# Patient Record
Sex: Female | Born: 1980
Health system: Southern US, Community
[De-identification: ages and names within clinical notes are randomized; demographics above are authoritative.]

## PROBLEM LIST (undated history)

## (undated) DIAGNOSIS — F329 Major depressive disorder, single episode, unspecified: Secondary | ICD-10-CM

## (undated) DIAGNOSIS — F32A Depression, unspecified: Secondary | ICD-10-CM

## (undated) DIAGNOSIS — E785 Hyperlipidemia, unspecified: Secondary | ICD-10-CM

## (undated) DIAGNOSIS — J302 Other seasonal allergic rhinitis: Secondary | ICD-10-CM

## (undated) DIAGNOSIS — K589 Irritable bowel syndrome without diarrhea: Secondary | ICD-10-CM

## (undated) DIAGNOSIS — K5900 Constipation, unspecified: Secondary | ICD-10-CM

## (undated) DIAGNOSIS — R768 Other specified abnormal immunological findings in serum: Secondary | ICD-10-CM

## (undated) DIAGNOSIS — Z9889 Other specified postprocedural states: Secondary | ICD-10-CM

## (undated) DIAGNOSIS — E559 Vitamin D deficiency, unspecified: Secondary | ICD-10-CM

## (undated) DIAGNOSIS — Z8739 Personal history of other diseases of the musculoskeletal system and connective tissue: Secondary | ICD-10-CM

## (undated) DIAGNOSIS — R112 Nausea with vomiting, unspecified: Secondary | ICD-10-CM

## (undated) DIAGNOSIS — I7300 Raynaud's syndrome without gangrene: Secondary | ICD-10-CM

## (undated) DIAGNOSIS — M359 Systemic involvement of connective tissue, unspecified: Secondary | ICD-10-CM

## (undated) DIAGNOSIS — K921 Melena: Secondary | ICD-10-CM

## (undated) DIAGNOSIS — M7989 Other specified soft tissue disorders: Secondary | ICD-10-CM

## (undated) DIAGNOSIS — K760 Fatty (change of) liver, not elsewhere classified: Secondary | ICD-10-CM

## (undated) DIAGNOSIS — M1990 Unspecified osteoarthritis, unspecified site: Secondary | ICD-10-CM

## (undated) DIAGNOSIS — K219 Gastro-esophageal reflux disease without esophagitis: Secondary | ICD-10-CM

## (undated) DIAGNOSIS — F419 Anxiety disorder, unspecified: Secondary | ICD-10-CM

## (undated) DIAGNOSIS — D649 Anemia, unspecified: Secondary | ICD-10-CM

## (undated) DIAGNOSIS — T7840XA Allergy, unspecified, initial encounter: Secondary | ICD-10-CM

## (undated) DIAGNOSIS — G9332 Myalgic encephalomyelitis/chronic fatigue syndrome: Secondary | ICD-10-CM

## (undated) DIAGNOSIS — R002 Palpitations: Secondary | ICD-10-CM

## (undated) DIAGNOSIS — K37 Unspecified appendicitis: Secondary | ICD-10-CM

## (undated) DIAGNOSIS — R079 Chest pain, unspecified: Secondary | ICD-10-CM

## (undated) DIAGNOSIS — D509 Iron deficiency anemia, unspecified: Secondary | ICD-10-CM

## (undated) DIAGNOSIS — L509 Urticaria, unspecified: Secondary | ICD-10-CM

## (undated) DIAGNOSIS — B019 Varicella without complication: Secondary | ICD-10-CM

## (undated) HISTORY — DX: Personal history of other diseases of the musculoskeletal system and connective tissue: Z87.39

## (undated) HISTORY — DX: Major depressive disorder, single episode, unspecified: F32.9

## (undated) HISTORY — DX: Unspecified appendicitis: K37

## (undated) HISTORY — DX: Other specified soft tissue disorders: M79.89

## (undated) HISTORY — DX: Irritable bowel syndrome, unspecified: K58.9

## (undated) HISTORY — DX: Anemia, unspecified: D64.9

## (undated) HISTORY — DX: Varicella without complication: B01.9

## (undated) HISTORY — DX: Hyperlipidemia, unspecified: E78.5

## (undated) HISTORY — DX: Anxiety disorder, unspecified: F41.9

## (undated) HISTORY — DX: Depression, unspecified: F32.A

## (undated) HISTORY — DX: Other specified abnormal immunological findings in serum: R76.8

## (undated) HISTORY — DX: Urticaria, unspecified: L50.9

## (undated) HISTORY — DX: Myalgic encephalomyelitis/chronic fatigue syndrome: G93.32

## (undated) HISTORY — PX: WISDOM TOOTH EXTRACTION: SHX21

## (undated) HISTORY — DX: Unspecified osteoarthritis, unspecified site: M19.90

## (undated) HISTORY — DX: Constipation, unspecified: K59.00

## (undated) HISTORY — DX: Chest pain, unspecified: R07.9

## (undated) HISTORY — DX: Vitamin D deficiency, unspecified: E55.9

## (undated) HISTORY — DX: Iron deficiency anemia, unspecified: D50.9

## (undated) HISTORY — DX: Raynaud's syndrome without gangrene: I73.00

## (undated) HISTORY — DX: Gastro-esophageal reflux disease without esophagitis: K21.9

## (undated) HISTORY — DX: Fatty (change of) liver, not elsewhere classified: K76.0

## (undated) HISTORY — DX: Palpitations: R00.2

## (undated) HISTORY — DX: Melena: K92.1

## (undated) HISTORY — DX: Allergy, unspecified, initial encounter: T78.40XA

---

## 2001-11-30 ENCOUNTER — Other Ambulatory Visit: Admission: RE | Admit: 2001-11-30 | Discharge: 2001-11-30 | Payer: Self-pay | Admitting: Obstetrics & Gynecology

## 2002-11-18 ENCOUNTER — Inpatient Hospital Stay (HOSPITAL_COMMUNITY): Admission: AD | Admit: 2002-11-18 | Discharge: 2002-12-01 | Payer: Self-pay | Admitting: Obstetrics & Gynecology

## 2002-11-25 ENCOUNTER — Encounter: Payer: Self-pay | Admitting: Obstetrics and Gynecology

## 2002-11-28 ENCOUNTER — Encounter: Payer: Self-pay | Admitting: Obstetrics & Gynecology

## 2002-11-29 ENCOUNTER — Encounter: Payer: Self-pay | Admitting: Obstetrics and Gynecology

## 2002-12-22 ENCOUNTER — Other Ambulatory Visit: Admission: RE | Admit: 2002-12-22 | Discharge: 2002-12-22 | Payer: Self-pay | Admitting: Obstetrics and Gynecology

## 2004-01-09 ENCOUNTER — Other Ambulatory Visit: Admission: RE | Admit: 2004-01-09 | Discharge: 2004-01-09 | Payer: Self-pay | Admitting: Obstetrics and Gynecology

## 2004-08-09 ENCOUNTER — Ambulatory Visit: Payer: Self-pay | Admitting: Internal Medicine

## 2004-11-14 ENCOUNTER — Other Ambulatory Visit: Admission: RE | Admit: 2004-11-14 | Discharge: 2004-11-14 | Payer: Self-pay | Admitting: Obstetrics and Gynecology

## 2004-11-15 ENCOUNTER — Other Ambulatory Visit: Admission: RE | Admit: 2004-11-15 | Discharge: 2004-11-15 | Payer: Self-pay | Admitting: Obstetrics and Gynecology

## 2005-06-15 ENCOUNTER — Inpatient Hospital Stay (HOSPITAL_COMMUNITY): Admission: AD | Admit: 2005-06-15 | Discharge: 2005-06-15 | Payer: Self-pay | Admitting: Obstetrics & Gynecology

## 2005-06-20 ENCOUNTER — Inpatient Hospital Stay (HOSPITAL_COMMUNITY): Admission: AD | Admit: 2005-06-20 | Discharge: 2005-06-22 | Payer: Self-pay | Admitting: Obstetrics and Gynecology

## 2005-06-20 ENCOUNTER — Encounter (INDEPENDENT_AMBULATORY_CARE_PROVIDER_SITE_OTHER): Payer: Self-pay | Admitting: *Deleted

## 2006-04-07 ENCOUNTER — Ambulatory Visit: Payer: Self-pay | Admitting: Endocrinology

## 2007-01-31 ENCOUNTER — Encounter: Payer: Self-pay | Admitting: *Deleted

## 2007-01-31 DIAGNOSIS — I7300 Raynaud's syndrome without gangrene: Secondary | ICD-10-CM | POA: Insufficient documentation

## 2007-01-31 DIAGNOSIS — K219 Gastro-esophageal reflux disease without esophagitis: Secondary | ICD-10-CM

## 2007-01-31 HISTORY — DX: Raynaud's syndrome without gangrene: I73.00

## 2007-01-31 HISTORY — DX: Gastro-esophageal reflux disease without esophagitis: K21.9

## 2008-02-01 ENCOUNTER — Ambulatory Visit: Payer: Self-pay | Admitting: Internal Medicine

## 2008-02-01 DIAGNOSIS — J209 Acute bronchitis, unspecified: Secondary | ICD-10-CM | POA: Insufficient documentation

## 2008-08-23 ENCOUNTER — Ambulatory Visit: Payer: Self-pay | Admitting: Internal Medicine

## 2008-12-27 ENCOUNTER — Inpatient Hospital Stay (HOSPITAL_COMMUNITY): Admission: AD | Admit: 2008-12-27 | Discharge: 2008-12-31 | Payer: Self-pay | Admitting: Obstetrics and Gynecology

## 2008-12-28 ENCOUNTER — Encounter (INDEPENDENT_AMBULATORY_CARE_PROVIDER_SITE_OTHER): Payer: Self-pay | Admitting: Obstetrics and Gynecology

## 2010-04-13 ENCOUNTER — Telehealth: Payer: Self-pay | Admitting: Internal Medicine

## 2010-06-12 NOTE — Progress Notes (Signed)
  Phone Note Other Incoming   Summary of Call: Per Dr Everardo All Buspar prescr by Dr Fabian Sharp before. [facility] is closed -needs Buspar called in to local pharmacy  Initial call taken by: Tresa Garter MD,  April 13, 2010 5:54 PM  Follow-up for Phone Call        ok Follow-up by: Tresa Garter MD,  April 13, 2010 6:04 PM    New/Updated Medications: BUSPIRONE HCL 15 MG TABS (BUSPIRONE HCL) 1/2 tab two times a day for anxiety Prescriptions: BUSPIRONE HCL 15 MG TABS (BUSPIRONE HCL) 1/2 tab two times a day for anxiety  #30 x 0   Entered and Authorized by:   Tresa Garter MD   Signed by:   Tresa Garter MD on 04/13/2010   Method used:   Electronically to        [facility] [address] [phone]* (retail)       [address]       [place], [place]  [address]       Ph: [phone] or [phone]       Fax: [phone]   RxID:   [id number]

## 2010-06-19 ENCOUNTER — Other Ambulatory Visit: Payer: Self-pay | Admitting: Obstetrics and Gynecology

## 2010-08-18 LAB — CBC
HCT: 25.9 % — ABNORMAL LOW (ref 36.0–46.0)
Hemoglobin: 9.2 g/dL — ABNORMAL LOW (ref 12.0–15.0)
MCHC: 34.7 g/dL (ref 30.0–36.0)
MCHC: 34.8 g/dL (ref 30.0–36.0)
MCHC: 35.5 g/dL (ref 30.0–36.0)
MCV: 89.7 fL (ref 78.0–100.0)
MCV: 91.1 fL (ref 78.0–100.0)
Platelets: 221 10*3/uL (ref 150–400)
Platelets: 231 10*3/uL (ref 150–400)
Platelets: 242 10*3/uL (ref 150–400)
RBC: 2.47 MIL/uL — ABNORMAL LOW (ref 3.87–5.11)
RDW: 13.3 % (ref 11.5–15.5)
RDW: 13.4 % (ref 11.5–15.5)
RDW: 13.6 % (ref 11.5–15.5)
WBC: 17 10*3/uL — ABNORMAL HIGH (ref 4.0–10.5)

## 2010-08-18 LAB — COMPREHENSIVE METABOLIC PANEL
ALT: 17 U/L (ref 0–35)
ALT: 17 U/L (ref 0–35)
ALT: 18 U/L (ref 0–35)
ALT: 19 U/L (ref 0–35)
AST: 23 U/L (ref 0–37)
AST: 23 U/L (ref 0–37)
AST: 36 U/L (ref 0–37)
Albumin: 2.2 g/dL — ABNORMAL LOW (ref 3.5–5.2)
Albumin: 2.7 g/dL — ABNORMAL LOW (ref 3.5–5.2)
Alkaline Phosphatase: 106 U/L (ref 39–117)
Alkaline Phosphatase: 86 U/L (ref 39–117)
Alkaline Phosphatase: 90 U/L (ref 39–117)
BUN: 3 mg/dL — ABNORMAL LOW (ref 6–23)
CO2: 25 mEq/L (ref 19–32)
CO2: 28 mEq/L (ref 19–32)
Calcium: 10.6 mg/dL — ABNORMAL HIGH (ref 8.4–10.5)
Calcium: 6.9 mg/dL — ABNORMAL LOW (ref 8.4–10.5)
Chloride: 104 mEq/L (ref 96–112)
Chloride: 105 mEq/L (ref 96–112)
Creatinine, Ser: 0.91 mg/dL (ref 0.4–1.2)
Creatinine, Ser: 1.05 mg/dL (ref 0.4–1.2)
GFR calc Af Amer: >60 mL/min (ref >60)
GFR calc Af Amer: >60 mL/min (ref >60)
GFR calc Af Amer: >60 mL/min (ref >60)
GFR calc Af Amer: >60 mL/min (ref >60)
GFR calc non Af Amer: >60 mL/min (ref >60)
GFR calc non Af Amer: >60 mL/min (ref >60)
Glucose, Bld: 81 mg/dL (ref 70–99)
Potassium: 3 mEq/L — ABNORMAL LOW (ref 3.5–5.1)
Potassium: 3 mEq/L — ABNORMAL LOW (ref 3.5–5.1)
Potassium: 3.3 mEq/L — ABNORMAL LOW (ref 3.5–5.1)
Sodium: 137 mEq/L (ref 135–145)
Sodium: 137 mEq/L (ref 135–145)
Sodium: 137 mEq/L (ref 135–145)
Sodium: 138 mEq/L (ref 135–145)
Total Bilirubin: 0.6 mg/dL (ref 0.3–1.2)
Total Protein: 5.2 g/dL — ABNORMAL LOW (ref 6.0–8.3)
Total Protein: 5.7 g/dL — ABNORMAL LOW (ref 6.0–8.3)
Total Protein: 6.7 g/dL (ref 6.0–8.3)

## 2010-08-18 LAB — LACTATE DEHYDROGENASE
LDH: 104 U/L (ref 94–250)
LDH: 127 U/L (ref 94–250)

## 2010-08-18 LAB — URINALYSIS, DIPSTICK ONLY
Ketones, ur: NEGATIVE mg/dL
Leukocytes, UA: NEGATIVE
Nitrite: NEGATIVE
Protein, ur: NEGATIVE mg/dL
pH: 8 (ref 5.0–8.0)

## 2010-08-18 LAB — MAGNESIUM: Magnesium: 7 mg/dL (ref 1.5–2.5)

## 2010-09-25 NOTE — Op Note (Signed)
NAMEGREENLY, Lindsay Wong                ACCOUNT NO.:  [id number]   MEDICAL RECORD NO.:  [id number]          PATIENT TYPE:  INP   LOCATION:  9371                          [facility]ACILITY:  [facility]   PHYSICIAN:  Carrington Clamp, M.D. DATE OF BIRTH:  07-Mar-1981   DATE OF PROCEDURE:  12/27/2008  DATE OF DISCHARGE:                               OPERATIVE REPORT   PREOPERATIVE DIAGNOSES:  Severe preeclampsia 35-5/7th weeks with history  of severe preeclampsia and prior pregnancies and repeat cesarean  section.   POSTOPERATIVE DIAGNOSES:  Severe preeclampsia 35-5/7th weeks with  history of severe preeclampsia and prior pregnancies and repeat cesarean  section.   PROCEDURE:  Low transverse cesarean section.   SURGEON:  Carrington Clamp, MD   ANESTHESIA:  Spinal.   FINDINGS:  Female infant, frank breech presentation with a single nuchal  cord with Apgars 9 and 9.  Baby weighed 5 pounds 15 ounces.  There was  normal tubes, ovaries, and uterus seen.   SPECIMENS:  Placenta.   DISPOSITION:  To pathology.   BLOOD LOSS:  600 mL.   IV FLUIDS:  2000 mL.   URINE OUTPUT:  300 mL.   COMPLICATIONS:  None.   MEDICATIONS:  Magnesium sulfate, Ancef 2 g, and Pitocin.   COUNTS:  Correct x3.   REASON FOR OPERATION:  Ms. Bucher has had 2 prior cesarean deliveries  at 36 weeks.  This was secondary to severe preeclampsia.  Although the  patient has never HELLP syndrome, she did have history of severe  progressively worsening preeclampsia and blood pressures.  She was  admitted on December 27, 2008, with complaints of abdominal headaches and  nausea and vomiting.  The patient's blood pressures were running 150s-  160s over 100s.  The patient was observed overnight.  Although her  laboratory values all remained within normal limits, her platelets did  drop from 230-200 and her creatinine went up from 0.88-0.92.  The  patient continued to feel bad, have a dull headache, have started with  some mouth tingling and  continued vomiting today.  Given the history of  severe preeclampsia and 2 prior pregnancies at 36 weeks and the clearly  elevated severe blood pressures with severe symptoms, I felt strongly  that we would probably not get more than 1-2 days before the patient  would move into worsening symptoms possibly HELLP, possible pulmonary  edema, possible seizures.  After discussing with the patient and her  husband the risks, benefits, and alternatives of delivery 2 days earlier  versus worsening symptoms and the need for possible emergent cesarean  section, we decided to proceed with a C-section today.  All risks,  benefits, and alternatives have been discussed with the patient and her  husband.   Technique:  After adequate spinal anesthesia was achieved, the patient was prepped  and draped in usual sterile fashion in dorsal supine position with  leftward tilt.  A Pfannenstiel skin incision was made with the scalpel  and carried down to the fascia with Bovie cautery.  The fascia was  incised in the midline carefully with the scalpel.  It  was extended then  in a transverse curvilinear manner with the Mayo scissors.  The rectus  muscles were completely diastased except for one portion on the right-  hand side.  This was carefully removed from the anterior posterior  fascia.  The scalpel was then used to enter into scar tissue of the  peritoneum superiorly as far away from the bladder as possible.  The  peritoneum was entered into successfully and then the peritoneum was  able to be opened in a superior inferior manner with good visualization  of the bowel and the bladder.   The lower uterine segment was grossly adhesed to the peritoneum, and  these adhesions were taken down carefully with blunt and sharp  dissection.  The bladder was well out of the way of the field of the  dissection, and a 2-cm incision was then able to be made after the  Charlotte instrument had been placed in the upper  portion of the lower  uterine segment.  Clear fluid was noted on entry into the amnion.  The  uterus was extended in transverse curvilinear manner.  Baby was  identified in the breech presentation and delivered without  complication.  The baby was bulb suctioned, and cord was clamped and  cut.  Baby was handed to awaiting pediatrics.   The patient and her husband have desired private donation cord blood,  and the cord blood was obtained with the private donation kit.  Once  that was achieved, the placenta was then delivered from the uterus, and  the uterus cleared of all debris.  The uterine incisions were then  closed with running locked stitch of 0 Monocryl.  An imbricating layer  was performed as well.  The ovaries and tubes appeared to be intact, and  the patient and her husband were uncertain about permanent  sterilization, so tubal was not done.   The peritoneum was then able to be identified and brought together  carefully in the midline with 2-0 Vicryl.  Then, a careful inspection of  all of the portions of the rectus muscles some which were diastased and  some which were midline were all examined.  In the midline, inferior  portion of the rectus muscles that were the midline, these were brought  together carefully with interrupted stitches of 0 Vicryl.  To the  lateral side, the portions of the rectus muscle diastased were brought  together back towards the edges of the midline portions of the rectus  muscles with interrupted stitches of 0 Vicryl.  The fascia was then  closed with a running stitch of 0 Vicryl.  The subcutaneous tissue was  rendered hemostatic with Bovie cautery and irrigation and then was  mobilized inferiorly and superiorly with the Bovie cautery.  The old  scar which had pulled in significantly was then able to be removed with  the scalpel in the lower portion.  The subcutaneous tissue was then  closed in 2 layers with interrupted stitches of 2-0 plain gut.   The skin  was closed with staples.  The patient will need to wear an abdominal  binder because of the repair of the rectus muscle diastasis.      Carrington Clamp, M.D.  Electronically Signed     MH/MEDQ  D:  12/28/2008  T:  12/29/2008  Job:  [id number]

## 2010-09-28 NOTE — Discharge Summary (Signed)
[name], [name]                ACCOUNT NO.:  [id number]   MEDICAL RECORD NO.:  [id number]          PATIENT TYPE:  INP   LOCATION:  9374                          [facility]ACILITY:  [facility]   PHYSICIAN:  Malva Limes, M.D.    DATE OF BIRTH:  [birth year]   DATE OF ADMISSION:  06/20/2005  DATE OF DISCHARGE:  06/22/2005                                 DISCHARGE SUMMARY   FINAL DIAGNOSES:  1.  Intrauterine gestation at 38 and four-sevenths weeks gestation.  2.  Preeclampsia.  3.  History of prior cesarean section, desires repeat cesarean section.  4.  Positive group B streptococcus.   PROCEDURE:  Repeat cesarean section using transverse lower fundal incision.  Surgeon:  Dr. Conley Simmonds. Assistant:  None. Complications:  None.   This 30 year old G2, P1 presents at 91 and four-sevenths weeks gestation for  a blood pressure check. The patient had been followed closely in the office  over the last 2 weeks due to some elevated blood pressures. On June 18, 2005, she had pre-eclamptic labs checked which were negative except for an  elevated uric acid. She had a trace of protein in her urine but a reactive  nonstress test. Otherwise, the patient's antepartum course up to this point  had not been complicated. She has had a history of C-section, wants a repeat  with this pregnancy. The patient also was tested to be positive group B  strep around her 35-week visit. Her blood pressure started elevating around  35-[redacted] weeks gestation. Like I said, she had been continued to be monitored.  When she presented to the office on February 8 her blood pressure at home  was 150/106. She was reporting headaches, malaise, increased dyspepsia and  some vomiting. Blood pressure was 140/100 in the office with 3+ protein in  her urine. She was sent immediately to [facility] for fetal monitoring  and repeat PIH labs, which still documented that elevated uric acid. At this  point a decision was made with the patient  to proceed with a cesarean  section secondary to the preeclampsia. The patient was taken to the  operating room on June 20, 2005, by Dr. Conley Simmonds where a repeat low  fundal incision was made with the delivery of a 6-pound 2-ounce female infant  with Apgars of 9 and 9. The patient was, of course, on magnesium sulfate for  prophylaxis during her delivery and 24 hours postoperatively. By  postoperative day #1 she was diuresing well. Her blood pressure was  improving. She was Wong to be stopped on the magnesium sulfate after that 24-  hour period. The patient wanted discharge on postoperative day #2. Her blood  pressures were okay, she was feeling better, and she was felt ready for  discharge. The patient was sent home on a regular diet, told to decrease her  activities, told to continue her prenatal vitamins while breast feeding, was  given Percocet one to two every 4 hours as needed for pain, was told she  could use over-the-counter Motrin up to 600 mg every 6 hours as needed for  pain, was to follow up in the office on Monday for a staple removal and  blood pressure check, of course to call with any increased pain, bleeding or  problems.   LABORATORY ON DISCHARGE:  The patient had a hemoglobin of 9.9; white blood  cell count of 14.7; platelets of 243,000; and the patient's PIH panel was  within normal limits.      Lindsay Wong, P.A.-C.    ______________________________  Malva Limes, M.D.    MB/MEDQ  D:  07/11/2005  T:  07/11/2005  Job:  [id number]

## 2010-09-28 NOTE — Op Note (Signed)
NAME:  Lindsay Wong, Lindsay Wong NO.:  [id number]   MEDICAL RECORD NO.:  [id number]                   PATIENT TYPE:  INP   LOCATION:  9199                                 [facility]ILITY:  [facility]   PHYSICIAN:  Malva Limes, M.D.                 DATE OF BIRTH:  1981-03-08   DATE OF PROCEDURE:  11/19/2002  DATE OF DISCHARGE:                                 OPERATIVE REPORT   POSTOPERATIVE DIAGNOSES:  1. Intrauterine pregnancy at 26 weeks estimated gestational age.  2. Pre-eclampsia.  3. Cephalopelvic disproportion.  4. Fetal tachycardia.   POSTOPERATIVE DIAGNOSES:  1. Intrauterine pregnancy at 36 weeks estimated gestational age.  2 . Pre-eclampsia.  1. Cephalopelvic disproportion.  2. Fetal tachycardia.   PROCEDURE:  Primary low transverse cesarean section.   SURGEON:  Malva Limes, M.D.   ANESTHESIA:  Epidural.   ANTIBIOTICS:  Unasyn.   ESTIMATED BLOOD LOSS:  900 mL.   COMPLICATIONS:  None.   SPECIMENS:  None.   DRAINS:  Foley to bedside drainage.   PROCEDURE:  The patient was taken to the operating room where she was placed  in the dorsal supine position with left lateral tilt.  Once an adequate  level of anesthetic was reached the patient was prepped with Hibiclens and  draped in the usual fashion for this procedure.  A Pfannenstiel incision was  made and this was carried down to the fascia.  The fascia was entered in the  midline and extended laterally.  The rectus muscles were separated from the  fascia with the Bovie.  The rectus muscles were divided in the midline and  taken superiorly and inferiorly.  The parietoperitoneum was entered and  taken superiorly and inferiorly.  The bladder flap was taken down sharply.  A low transverse uterine incision was made in the midline and extended  laterally with blunt dissection. Amniotic fluid was noted to be clear on  entering the amniotic sac.  The infant was delivered in the vertex  presentation.  On  delivery of the head the nostrils and oropharynx were bulb  suctioned.  The infant was delivered.  The cord was doubly clamped and cut.  The infant was handed to the waiting NICU team.  The placenta was then  manually removed. The uterus was exteriorized and the uterine cavity was  wiped with a wet lap.  The uterine incision was closed with a single layer  of 0 chromic in a running locking fashion.  The bladder flap was closed  using 3-0 chromic in a running fashion.  The uterus was placed back into the  abdominal cavity.  Hemostasis was rechecked and found to be adequate.  The  parietoperitoneum and rectus muscles were reapproximated in the midline  using 3-0 chromic in a running fashion.  The fascia was closed using 0  Monocryl suture in a running fashion.  The subcuticular tissue was  closed  using interrupted 0 chromic suture.  The skin was closed with stainless  steel staples.  The patient was taken to the recovery room in stable  condition.  Sponge, instrument and lap counts were correct times two.                                               Malva Limes, M.D.    MA/MEDQ  D:  11/19/2002  T:  11/21/2002  Job:  [id number]

## 2010-09-28 NOTE — Discharge Summary (Signed)
NAME:  Lindsay Wong, KNISKERN                          ACCOUNT NO.:  [id number]   MEDICAL RECORD NO.:  [id number]                   PATIENT TYPE:  INP   LOCATION:  9140                                 [facility]   PHYSICIAN:  Gerrit Friends. Aldona Bar, M.D.                DATE OF BIRTH:  [birth year]   DATE OF ADMISSION:  11/18/2002  DATE OF DISCHARGE:  12/01/2002                                 DISCHARGE SUMMARY   DISCHARGE DIAGNOSES:  1. A 36-week intrauterine pregnancy.  Delivered viable 6 pound 2 ounce female     infant, Apgars 8 and 9.  2. Blood type B+.  3. Preeclampsia.  4. Cephalopelvic disproportion.  5. Postoperative fever secondary to organizing hematoma right lower     quadrant.   PROCEDURES:  1. Primary low transverse cesarean section November 19, 2002, Malva Limes, M.D.  2. Eventual placement of PICC line on July 18.  3. Intravenous antibiotics.  4. Infectious disease consultation.  5. CAT scan x2.  6. Ultrasound x1.   SUMMARY:  This 30 year old primigravida was admitted at 47 weeks with  hypertension and proteinuria and felt to have severe preeclampsia.  She was  started on magnesium sulfate and underwent induction of labor.  She  progressed to full dilatation, but unfortunately, was unable to deliver the  baby.  She did have a fever while she was in labor.  She underwent a primary  low transverse cesarean section on the evening of July 9.  Postoperatively  she did well initially, but did develop some fever and antibiotics were  manipulated somewhat over the ensuing three to four days.  On the morning of  July 15 she was having spiking p.m. fevers and a pelvic examination revealed  tenderness in the right lower quadrant/right adnexal area.  Her white count  was 14,700.  Clindamycin was added to her antibiotic regimen and a CAT scan  was carried out with no specific findings except for some postoperative  fluid accumulations in the right lower quadrant noted.  On the morning of  July 16 she had an episode of right lower quadrant pain.  Underwent another  pelvic examination.  When I took over her care on the morning of July 16  after the episode of right lower quadrant she was more comfortable.  Her  white count was 13,400.  Her platelet count had risen to 522,000 and her  triple antibiotics were continued.  Because of the possibility of septic  pelvic thrombophlebitis she was heparinized on the evening of July 16.  It  was a little difficult to heparinize her adequately and she probably did not  become adequately heparinized until July 17 but nonetheless her fevers  persisted.  She did not clinically look that sick but because of her  persistent fever and her elevated white count and her elevated platelet  count infectious disease consultation was obtained from [facility]  Lubertha Sayres,  M.D.  On Fransisco Hertz, M.D. recommendation her ampicillin and gentamicin  were discontinued on July 18 and Avelox 400 mg IV q.24h. was substituted.  Her anticoagulation was continued until July 19.  Because of difficulty in  obtaining intravenous access, a PICC line was placed on July 18.  On July  19, as mentioned, she underwent a repeat CAT scan after pelvic ultrasound  and the pelvic and abdominal ultrasounds suggested a hematoma in the right  adnexal area measuring approximately 5 cm.  Her CAT scan was relatively  unremarkable with the exception of what was seen in the right adnexal area.  Anticoagulation was discontinued on the evening of July 19.  Drainage of  this hematoma was considered.  However, on the morning of July 20 the  patient had improvement in her white count.  The white blood count went from  24,700 on the morning of July 19 to 16,300 on the morning of July 20.  Likewise, her platelet count decreased from 643,000 on the morning of July  19 to 534,000 on the morning of July 20.  On the morning of July 21 her  white count was 14,700, her hemoglobin stable at 8.9, and her  platelet count  546,000.  Differential was not done.   On the morning of July 21 it was felt that she was doing well except for her  persistent fever which was now low grade and it was decided that she could  continue her antibiotics per home health at home.  Her Avelox was changed to  Aztreonam and her clindamycin was continued.  It was felt that her fever was  most likely from her right lower quadrant collection, hematoma, plus/minus  infection.  She was discharged to home as mentioned to have home health  continue her clindamycin 900 mg q.8h. for 10 more days as well as Aztreonam  1 g q.8h. IV.  It will be arranged for her to have a CBC and differential  drawn on the morning of July 23 with results called to [facility].  Subsequent follow-up in our office will be two to three weeks hence and  close follow-up not only by our office but infectious disease will be  maintained.   CONDITION ON DISCHARGE:  Improved.  Of note, the patient had a PPD placed on  July 19 which as of the morning of July 21 was negative and she had a stool  for Clostridium difficile x2 which was negative.   In summary, this patient underwent a cesarean section on the evening of July  9 for obstetrical indications and had a postoperative fever and elevated  white count presumably secondary to a right lower quadrant/right adnexal  collection, hematoma, possibly/probably infected which did ultimately  respond to antibiotics.  Septic pelvic thrombophlebitis was entertained  during the course of her hospital stay but anticoagulation with heparin did  not result in any direct improvement and it was not until the morning of  July 20 that there was actually an improvement in the white blood count and  elevated platelet count, concurrent with the previous days' ultrasound and  CAT scan revealing the collection in the right lower quadrant.  Because of improvement decision was made to discharge the patient to home  on continued  antibiotic therapy in hopes of resolution.  Exploration was considered but  because of the patient's improvement it was decided not to be pursued at  this point in time.  CONDITION ON DISCHARGE:  Improved.                                               Gerrit Friends. Aldona Bar, M.D.    RMW/MEDQ  D:  12/01/2002  T:  12/01/2002  Job:  [id number]   cc:   Fransisco Hertz, M.D.  [address]  [place] [address]  Fax: [phone]

## 2010-09-28 NOTE — Op Note (Signed)
Lindsay Wong, Lindsay Wong NO.:  [id number]   MEDICAL RECORD NO.:  [id number]          PATIENT TYPE:  INP   LOCATION:  9374                          FACILITY:  [facility]   PHYSICIAN:  Randye Lobo, M.D.   DATE OF BIRTH:  [birth year]   DATE OF PROCEDURE:  06/20/2005  DATE OF DISCHARGE:                                 OPERATIVE REPORT   PREOPERATIVE DIAGNOSES:  1.  Intrauterine gestation at 36 weeks 4 days.  2.  Preeclampsia.  3.  History of prior cesarean section, desires repeat cesarean section.   POSTOPERATIVE DIAGNOSES:  1.  Intrauterine gestation at 36 weeks 4 days.  2.  Preeclampsia.  3.  History of prior cesarean section, desires repeat cesarean section.   PROCEDURE:  Repeat cesarean section, transverse lower fundal incision.   SURGEON:  Randye Lobo, M.D.   ANESTHESIA:  Spinal.   IV FLUIDS:  2000 mL Ringer's lactate.   ESTIMATED BLOOD LOSS:  600 mL.   URINE OUTPUT:  225 mL.   COMPLICATIONS:  None.   INDICATIONS FOR PROCEDURE:  The patient is a 30 year old gravida 2, para 1  Caucasian female at 103 plus 4 weeks' gestation by last menstrual period and  first trimester ultrasound, who presented to the office for a blood pressure  recheck.  The patient had been followed closely in the office over the last  two weeks due to elevated blood pressures.  On June 18, 2004, the patient  had pre-eclamptic labs checked which were unremarkable with the exception of  an elevated uric acid.  She had trace protein in her urine.  She had a  reactive nonstress test.   When the patient presented to the office on June 20, 2005, she reported  that her blood pressure at home had been 150/106.  She was reporting  headache, malaise, increased dyspepsia and some vomiting.  The patient's  blood pressure was noted to be 140/100.  She had 3+ protein in her urine.  The patient was sent to the [facility] for fetal monitoring and repeat  [facility] labs which again documented  only an elevated uric acid.  The patient's  blood pressure continued to be elevated at the [facility], and a plan  was made to proceed with a repeat cesarean section after risks, benefits,  and alternatives were discussed with her.  Of note, the patient was  scheduled for repeat cesarean section on June 24, 2005.   FINDINGS:  A viable female was delivered at 24.  Apgars were 9 at one minute  and 9 at five minutes.  The presentation was vertex.  The amniotic fluid was  clear.  The patient had a normal uterus, tubes, and ovaries.  There was some  scarring of the abdominal wall.  There appeared to be a diastasis recti on  the patient's left side.  This was repaired at the termination of the  cesarean section.  There was also some significant dense scarring between  the bladder and the lower uterine segment which necessitated making  essentially a transverse lower fundal uterine incision for  the cesarean  delivery.   SPECIMENS:  The placenta was sent to pathology.   DESCRIPTION OF PROCEDURE:  The patient was reidentified in the maternity  admissions unit.  She was taken down to the operating room where spinal  anesthetic was administered.  The patient was then placed in the supine  position with a left lateral tilt, and the abdomen was sterilely prepped and  a Foley catheter placed inside the bladder.  She was sterilely draped.   A transverse skin incision was created with the scalpel along the line of  the patient's previous Pfannenstiel incision.  This was carried down to the  fascia using a scalpel.  The fascia was scored in the midline, and the  incision was carried out bilaterally using the Mayo scissors. A diastasis  was appreciated on the patient's left-hand side at this time.  The fascia  was then dissected off of the rectus muscle both superiorly and inferiorly  using sharp dissection.  The parietal peritoneum was then entered high in  the abdomen after it was elevated  with two hemostat clamps.  The peritoneal  incision was extended cranially. It was extended caudally as well with care  taken to avoid cystotomy.  The bladder flap was noted to be quite high at  this point.  Sharp dissection was used to bring the bladder off of the  fundus enough so that a bladder retractor could be placed and then a  transverse lower fundal incision could be created with a scalpel.  The  uterine incision was extended bilaterally in an upward fashion using the  bandage scissors.  Membranes were ruptured, and clear fluid was noted.   A hand was inserted through the uterine incision, and the vertex was  elevated.  A Mityvac was used for one attempt to assist with delivery of the  vertex which was performed without difficulty.  Nares and mouth were  suctioned.  The remainder of the newborn was delivered.  The cord was doubly  clamped and cut, and the newborn was then carried over to the pediatricians  in vigorous condition.   The patient did receive Ancef 1 gram IV.  Cord blood was obtained and sent  to the lab and a cord blood collection kit was collected for the patient to  do personal blood banking.  The placenta was then manually extracted and was  sent to pathology. The uterus was exteriorized at this time, and it was  wiped clean with moistened lap pads.  The uterine incision was then closed  with a double layer closure of #1 chromic.  The first was a running locked  layer and the second was an imbricating layer.  This provided good  hemostasis along the uterine incision.   The uterus was returned to the peritoneal cavity which was irrigated and  suctioned.  The uterine incision was reexamined and was hemostatic.  The  abdomen was closed at this time.  The parietal peritoneum was closed with a  running suture of 3-0 Vicryl.  The rectus muscles were reapproximated in the midline, and the diastasis recti was corrected with interrupted sutures of  #1 chromic.  The  fascia was closed with a running suture of 0 Vicryl  bilaterally.  The subcutaneous tissue was irrigated and suctioned and made  hemostatic with monopolar cautery.  The skin was closed with staples and a  sterile bandage was placed over this.   This concluded the patient's surgery.  There were no complications.  All  needle, instrument and sponge counts were correct.  The patient was escorted  to the recovery room in stable and awake condition.      Randye Lobo, M.D.  Electronically Signed     BES/MEDQ  D:  06/20/2005  T:  06/21/2005  Job:  [id number]

## 2011-03-14 ENCOUNTER — Telehealth: Payer: Self-pay | Admitting: Internal Medicine

## 2011-03-14 ENCOUNTER — Ambulatory Visit (INDEPENDENT_AMBULATORY_CARE_PROVIDER_SITE_OTHER): Payer: 59 | Admitting: Internal Medicine

## 2011-03-14 ENCOUNTER — Encounter: Payer: Self-pay | Admitting: Internal Medicine

## 2011-03-14 DIAGNOSIS — H6690 Otitis media, unspecified, unspecified ear: Secondary | ICD-10-CM

## 2011-03-14 MED ORDER — AMOXICILLIN 500 MG PO CAPS: 1000.0000 mg | ORAL_CAPSULE | Freq: Two times a day (BID) | ORAL | Status: DC

## 2011-03-14 NOTE — Telephone Encounter (Signed)
ROM

## 2011-03-14 NOTE — Progress Notes (Signed)
  Subjective:    Patient ID: Lindsay Wong, female    DOB: 15-Feb-1981, 30 y.o.   MRN: [id number]  HPI   C/o R earache x 3 d  Review of Systems  Constitutional: Negative for fatigue.  HENT: Positive for ear pain. Negative for hearing loss, congestion, rhinorrhea, postnasal drip and ear discharge.   Respiratory: Negative for cough.        Objective:   Physical Exam  Constitutional: She appears well-developed and well-nourished.  HENT:  Head: Normocephalic and atraumatic.  Left Ear: External ear normal.  Nose: Nose normal.  Mouth/Throat: Oropharynx is clear and moist.       R eryth tymp membrane  Eyes: Pupils are equal, round, and reactive to light.  Neck: Normal range of motion. Neck supple. No tracheal deviation present. No thyromegaly present.  Cardiovascular: Normal rate.   Pulmonary/Chest: She has no rales.          Assessment & Plan:

## 2011-03-14 NOTE — Assessment & Plan Note (Signed)
Start Amoxicillin

## 2011-03-15 ENCOUNTER — Ambulatory Visit: Payer: Self-pay | Admitting: Internal Medicine

## 2012-07-20 ENCOUNTER — Encounter (HOSPITAL_COMMUNITY)
Admission: EM | Disposition: A | Discharge status: Home / Self Care | Payer: Self-pay | Source: Home / Self Care | Attending: Emergency Medicine

## 2012-07-20 ENCOUNTER — Encounter (HOSPITAL_BASED_OUTPATIENT_CLINIC_OR_DEPARTMENT_OTHER): Payer: Self-pay | Admitting: *Deleted

## 2012-07-20 ENCOUNTER — Observation Stay (HOSPITAL_BASED_OUTPATIENT_CLINIC_OR_DEPARTMENT_OTHER)
Admission: EM | Admit: 2012-07-20 | Discharge: 2012-07-21 | Disposition: A | Discharge status: Home / Self Care | Payer: 59 | Attending: Surgery | Admitting: Surgery

## 2012-07-20 ENCOUNTER — Ambulatory Visit (HOSPITAL_BASED_OUTPATIENT_CLINIC_OR_DEPARTMENT_OTHER)
Admission: RE | Admit: 2012-07-20 | Discharge: 2012-07-20 | Disposition: A | Discharge status: Home / Self Care | Payer: 59 | Source: Ambulatory Visit | Attending: Family Medicine | Admitting: Family Medicine

## 2012-07-20 ENCOUNTER — Encounter (HOSPITAL_BASED_OUTPATIENT_CLINIC_OR_DEPARTMENT_OTHER): Payer: Self-pay

## 2012-07-20 ENCOUNTER — Encounter (HOSPITAL_COMMUNITY): Payer: Self-pay | Admitting: Anesthesiology

## 2012-07-20 ENCOUNTER — Ambulatory Visit (INDEPENDENT_AMBULATORY_CARE_PROVIDER_SITE_OTHER): Payer: 59 | Admitting: Family Medicine

## 2012-07-20 ENCOUNTER — Emergency Department (HOSPITAL_COMMUNITY): Payer: 59 | Admitting: Anesthesiology

## 2012-07-20 ENCOUNTER — Encounter: Payer: Self-pay | Admitting: Family Medicine

## 2012-07-20 ENCOUNTER — Other Ambulatory Visit (HOSPITAL_BASED_OUTPATIENT_CLINIC_OR_DEPARTMENT_OTHER): Payer: 59

## 2012-07-20 VITALS — BP 100/80 | HR 115 | Temp 97.6°F | Ht 62.0 in | Wt 125.0 lb

## 2012-07-20 DIAGNOSIS — R1031 Right lower quadrant pain: Secondary | ICD-10-CM

## 2012-07-20 DIAGNOSIS — K37 Unspecified appendicitis: Secondary | ICD-10-CM

## 2012-07-20 DIAGNOSIS — J45909 Unspecified asthma, uncomplicated: Secondary | ICD-10-CM | POA: Insufficient documentation

## 2012-07-20 DIAGNOSIS — R509 Fever, unspecified: Secondary | ICD-10-CM

## 2012-07-20 DIAGNOSIS — K3580 Unspecified acute appendicitis: Principal | ICD-10-CM | POA: Insufficient documentation

## 2012-07-20 DIAGNOSIS — R630 Anorexia: Secondary | ICD-10-CM

## 2012-07-20 DIAGNOSIS — K219 Gastro-esophageal reflux disease without esophagitis: Secondary | ICD-10-CM | POA: Insufficient documentation

## 2012-07-20 HISTORY — PX: LAPAROSCOPIC APPENDECTOMY: SHX408

## 2012-07-20 HISTORY — DX: Unspecified appendicitis: K37

## 2012-07-20 HISTORY — DX: Other specified postprocedural states: R11.2

## 2012-07-20 HISTORY — DX: Other specified postprocedural states: Z98.890

## 2012-07-20 LAB — URINALYSIS, ROUTINE W REFLEX MICROSCOPIC
Ketones, ur: NEGATIVE mg/dL
Leukocytes, UA: NEGATIVE
Nitrite: NEGATIVE
Protein, ur: NEGATIVE mg/dL
pH: 6.5 (ref 5.0–8.0)

## 2012-07-20 LAB — CBC
MCH: 29.1 pg (ref 26.0–34.0)
MCHC: 34.7 g/dL (ref 30.0–36.0)
MCV: 84 fL (ref 78.0–100.0)
Platelets: 278 10*3/uL (ref 150–400)
RBC: 5.12 MIL/uL — ABNORMAL HIGH (ref 3.87–5.11)

## 2012-07-20 LAB — COMPREHENSIVE METABOLIC PANEL
AST: 11 U/L (ref 0–37)
CO2: 26 mEq/L (ref 19–32)
Calcium: 10.2 mg/dL (ref 8.4–10.5)
Creatinine, Ser: 0.9 mg/dL (ref 0.50–1.10)
GFR calc Af Amer: >90 mL/min (ref >90)
GFR calc non Af Amer: 83 mL/min — ABNORMAL LOW (ref >90)
Total Protein: 9 g/dL — ABNORMAL HIGH (ref 6.0–8.3)

## 2012-07-20 LAB — PROTIME-INR: INR: 1.09 (ref 0.00–1.49)

## 2012-07-20 SURGERY — APPENDECTOMY, LAPAROSCOPIC
Anesthesia: General | Site: Abdomen | Laterality: Right | Wound class: Contaminated

## 2012-07-20 MED ORDER — PROPOFOL 10 MG/ML IV BOLUS
INTRAVENOUS | Status: DC | PRN
  Administered 2012-07-20: 120 mg via INTRAVENOUS

## 2012-07-20 MED ORDER — HYDROCODONE-ACETAMINOPHEN 5-325 MG PO TABS
1.0000 | ORAL_TABLET | ORAL | Status: DC | PRN
  Administered 2012-07-21 (×2): 1 via ORAL
  Filled 2012-07-20 (×2): qty 1

## 2012-07-20 MED ORDER — SODIUM CHLORIDE 0.9 % IV SOLN
3.0000 g | Freq: Once | INTRAVENOUS | Status: AC
  Administered 2012-07-20: 3 g via INTRAVENOUS
  Filled 2012-07-20: qty 3

## 2012-07-20 MED ORDER — SODIUM CHLORIDE 0.9 % IV BOLUS (SEPSIS)
1000.0000 mL | Freq: Once | INTRAVENOUS | Status: AC
  Administered 2012-07-20: 1000 mL via INTRAVENOUS

## 2012-07-20 MED ORDER — IOHEXOL 300 MG/ML  SOLN
100.0000 mL | Freq: Once | INTRAMUSCULAR | Status: AC | PRN
  Administered 2012-07-20: 100 mL via INTRAVENOUS

## 2012-07-20 MED ORDER — LACTATED RINGERS IV SOLN: INTRAVENOUS | Status: DC

## 2012-07-20 MED ORDER — IBUPROFEN 600 MG PO TABS
600.0000 mg | ORAL_TABLET | Freq: Four times a day (QID) | ORAL | Status: DC | PRN
  Filled 2012-07-20: qty 1

## 2012-07-20 MED ORDER — SCOPOLAMINE 1 MG/3DAYS TD PT72
MEDICATED_PATCH | TRANSDERMAL | Status: DC | PRN
  Administered 2012-07-20: 1 via TRANSDERMAL

## 2012-07-20 MED ORDER — LACTATED RINGERS IV SOLN
INTRAVENOUS | Status: DC | PRN
  Administered 2012-07-20: 19:00:00 via INTRAVENOUS

## 2012-07-20 MED ORDER — BUPIVACAINE-EPINEPHRINE 0.25% -1:200000 IJ SOLN
INTRAMUSCULAR | Status: DC | PRN
  Administered 2012-07-20: 30 mL

## 2012-07-20 MED ORDER — SCOPOLAMINE 1 MG/3DAYS TD PT72
MEDICATED_PATCH | TRANSDERMAL | Status: AC
  Filled 2012-07-20: qty 1

## 2012-07-20 MED ORDER — BUPIVACAINE-EPINEPHRINE 0.25% -1:200000 IJ SOLN
INTRAMUSCULAR | Status: AC
  Filled 2012-07-20: qty 1

## 2012-07-20 MED ORDER — GLYCOPYRROLATE 0.2 MG/ML IJ SOLN
INTRAMUSCULAR | Status: DC | PRN
  Administered 2012-07-20: .6 mg via INTRAVENOUS

## 2012-07-20 MED ORDER — 0.9 % SODIUM CHLORIDE (POUR BTL) OPTIME
TOPICAL | Status: DC | PRN
  Administered 2012-07-20: 1000 mL

## 2012-07-20 MED ORDER — SUCCINYLCHOLINE CHLORIDE 20 MG/ML IJ SOLN
INTRAMUSCULAR | Status: DC | PRN
  Administered 2012-07-20: 100 mg via INTRAVENOUS

## 2012-07-20 MED ORDER — ONDANSETRON HCL 4 MG/2ML IJ SOLN
INTRAMUSCULAR | Status: DC | PRN
  Administered 2012-07-20: 4 mg via INTRAVENOUS

## 2012-07-20 MED ORDER — NEOSTIGMINE METHYLSULFATE 1 MG/ML IJ SOLN
INTRAMUSCULAR | Status: DC | PRN
  Administered 2012-07-20: 4 mg via INTRAVENOUS

## 2012-07-20 MED ORDER — PIPERACILLIN-TAZOBACTAM 3.375 G IVPB
3.3750 g | Freq: Once | INTRAVENOUS | Status: AC
  Administered 2012-07-20: 3.375 g via INTRAVENOUS
  Filled 2012-07-20: qty 50

## 2012-07-20 MED ORDER — PROMETHAZINE HCL 25 MG/ML IJ SOLN: 6.2500 mg | INTRAMUSCULAR | Status: DC | PRN

## 2012-07-20 MED ORDER — HYDROMORPHONE HCL PF 1 MG/ML IJ SOLN: 0.2500 mg | INTRAMUSCULAR | Status: DC | PRN

## 2012-07-20 MED ORDER — PROMETHAZINE HCL 25 MG/ML IJ SOLN
12.5000 mg | INTRAMUSCULAR | Status: DC | PRN
  Administered 2012-07-20: 12.5 mg via INTRAVENOUS
  Filled 2012-07-20: qty 1

## 2012-07-20 MED ORDER — FENTANYL CITRATE 0.05 MG/ML IJ SOLN
INTRAMUSCULAR | Status: DC | PRN
  Administered 2012-07-20: 50 ug via INTRAVENOUS
  Administered 2012-07-20 (×2): 25 ug via INTRAVENOUS
  Administered 2012-07-20: 100 ug via INTRAVENOUS

## 2012-07-20 MED ORDER — LACTATED RINGERS IR SOLN
Status: DC | PRN
  Administered 2012-07-20: 1000 mL

## 2012-07-20 MED ORDER — DEXAMETHASONE SODIUM PHOSPHATE 10 MG/ML IJ SOLN
INTRAMUSCULAR | Status: DC | PRN
  Administered 2012-07-20: 10 mg via INTRAVENOUS

## 2012-07-20 MED ORDER — ROCURONIUM BROMIDE 100 MG/10ML IV SOLN
INTRAVENOUS | Status: DC | PRN
  Administered 2012-07-20: 20 mg via INTRAVENOUS

## 2012-07-20 MED ORDER — SODIUM CHLORIDE 0.9 % IV SOLN
3.0000 g | Freq: Four times a day (QID) | INTRAVENOUS | Status: DC
  Administered 2012-07-20 – 2012-07-21 (×3): 3 g via INTRAVENOUS
  Filled 2012-07-20 (×5): qty 3

## 2012-07-20 MED ORDER — ONDANSETRON HCL 4 MG/2ML IJ SOLN
4.0000 mg | Freq: Once | INTRAMUSCULAR | Status: AC
  Administered 2012-07-20: 4 mg via INTRAVENOUS
  Filled 2012-07-20: qty 2

## 2012-07-20 MED ORDER — HYDROMORPHONE HCL PF 1 MG/ML IJ SOLN
1.0000 mg | INTRAMUSCULAR | Status: DC | PRN
  Administered 2012-07-20: 1 mg via INTRAVENOUS
  Filled 2012-07-20: qty 1

## 2012-07-20 MED ORDER — KCL IN DEXTROSE-NACL 30-5-0.45 MEQ/L-%-% IV SOLN
INTRAVENOUS | Status: DC
  Administered 2012-07-20: 22:00:00 via INTRAVENOUS
  Filled 2012-07-20 (×2): qty 1000

## 2012-07-20 SURGICAL SUPPLY — 52 items
APL SKNCLS STERI-STRIP NONHPOA (GAUZE/BANDAGES/DRESSINGS) ×1
APPLIER CLIP ROT 10 11.4 M/L (STAPLE)
APR CLP MED LRG 11.4X10 (STAPLE)
BAG SPEC RTRVL LRG 6X4 10 (ENDOMECHANICALS) ×1
BENZOIN TINCTURE PRP APPL 2/3 (GAUZE/BANDAGES/DRESSINGS) ×2 IMPLANT
CANISTER SUCTION 2500CC (MISCELLANEOUS) ×2 IMPLANT
CLIP APPLIE ROT 10 11.4 M/L (STAPLE) IMPLANT
CLOTH BEACON ORANGE TIMEOUT ST (SAFETY) ×2 IMPLANT
CUTTER FLEX LINEAR 45M (STAPLE) ×1 IMPLANT
DECANTER SPIKE VIAL GLASS SM (MISCELLANEOUS) ×2 IMPLANT
DRAPE LAPAROSCOPIC ABDOMINAL (DRAPES) ×2 IMPLANT
ELECT REM PT RETURN 9FT ADLT (ELECTROSURGICAL) ×2
ELECTRODE REM PT RTRN 9FT ADLT (ELECTROSURGICAL) ×1 IMPLANT
ENDOLOOP SUT PDS II  0 18 (SUTURE)
ENDOLOOP SUT PDS II 0 18 (SUTURE) IMPLANT
GAUZE SPONGE 2X2 8PLY STRL LF (GAUZE/BANDAGES/DRESSINGS) IMPLANT
GLOVE BIOGEL PI IND STRL 6 (GLOVE) IMPLANT
GLOVE BIOGEL PI IND STRL 7.0 (GLOVE) ×1 IMPLANT
GLOVE BIOGEL PI INDICATOR 6 (GLOVE) ×1
GLOVE BIOGEL PI INDICATOR 7.0 (GLOVE)
GLOVE SURG ORTHO 8.0 STRL STRW (GLOVE) ×1 IMPLANT
GLOVE SURG SS PI 6.5 STRL IVOR (GLOVE) ×1 IMPLANT
GLOVE SURG SS PI 8.0 STRL IVOR (GLOVE) ×1 IMPLANT
GLOVE SURG SS PI 8.5 STRL IVOR (GLOVE) ×1
GLOVE SURG SS PI 8.5 STRL STRW (GLOVE) IMPLANT
GOWN STRL NON-REIN LRG LVL3 (GOWN DISPOSABLE) ×2 IMPLANT
GOWN STRL REIN XL XLG (GOWN DISPOSABLE) ×4 IMPLANT
HAND ACTIVATED (MISCELLANEOUS) ×1 IMPLANT
KIT BASIN OR (CUSTOM PROCEDURE TRAY) ×2 IMPLANT
PENCIL BUTTON HOLSTER BLD 10FT (ELECTRODE) IMPLANT
POUCH SPECIMEN RETRIEVAL 10MM (ENDOMECHANICALS) ×1 IMPLANT
RELOAD 45 VASCULAR/THIN (ENDOMECHANICALS) IMPLANT
RELOAD STAPLE 45 2.5 WHT GRN (ENDOMECHANICALS) IMPLANT
RELOAD STAPLE 45 3.5 BLU ETS (ENDOMECHANICALS) IMPLANT
RELOAD STAPLE TA45 3.5 REG BLU (ENDOMECHANICALS) ×2 IMPLANT
SET IRRIG TUBING LAPAROSCOPIC (IRRIGATION / IRRIGATOR) ×1 IMPLANT
SOLUTION ANTI FOG 6CC (MISCELLANEOUS) ×2 IMPLANT
SPONGE GAUZE 2X2 STER 10/PKG (GAUZE/BANDAGES/DRESSINGS) ×1
STRIP CLOSURE SKIN 1/2X4 (GAUZE/BANDAGES/DRESSINGS) ×2 IMPLANT
SUT MNCRL AB 4-0 PS2 18 (SUTURE) ×2 IMPLANT
TAPE CLOTH SOFT 2X10 (GAUZE/BANDAGES/DRESSINGS) ×1 IMPLANT
TOWEL OR 17X26 10 PK STRL BLUE (TOWEL DISPOSABLE) ×2 IMPLANT
TRAY FOLEY BAG SILVER LF 16FR (CATHETERS) ×1 IMPLANT
TRAY FOLEY CATH 14FRSI W/METER (CATHETERS) ×1 IMPLANT
TRAY LAP CHOLE (CUSTOM PROCEDURE TRAY) ×2 IMPLANT
TROCAR BLADELESS OPT 12M 100M (ENDOMECHANICALS) ×1 IMPLANT
TROCAR BLADELESS OPT 5 75 (ENDOMECHANICALS) ×2 IMPLANT
TROCAR OPTI TIP 12M 100M (ENDOMECHANICALS) IMPLANT
TROCAR XCEL BLUNT TIP 100MML (ENDOMECHANICALS) ×2 IMPLANT
TROCAR XCEL NON-BLD 11X100MML (ENDOMECHANICALS) ×1 IMPLANT
TUBING INSUFFLATION 10FT LAP (TUBING) ×2 IMPLANT
WATER STERILE IRR 1500ML POUR (IV SOLUTION) ×1 IMPLANT

## 2012-07-20 NOTE — Op Note (Signed)
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  CT abdomen demonstrated findings consistent with acute appendicitis.  Patient is now prepared and brought to the operating room for appendectomy.  Procedure:  Patient is brought to the operating room and placed in a supine position on the operating room table. Following administration of general anesthesia, a time out was held and the patient's name and type of procedure is confirmed. Patient is then prepped and draped in the usual strict aseptic fashion.  After ascertaining that an adequate level of anesthesia been achieved, a peri-umbilical incision is made with a #15 blade. Dissection is carried down to the fascia. Fascia is incised in the midline and the peritoneal cavity is entered cautiously. A #0-vicryl pursestring suture is placed in the fascia. An Hassan cannula is introduced under direct vision and secured with the pursestring suture. Abdomen is insufflated with carbon dioxide. Laparoscope is introduced and the abdomen is explored. Operative ports are placed in the right upper quadrant and left lower quadrant. Appendix is identified. Mesoappendix is divided with the harmonic scalpel. Dissection is carried down to the base of the appendix. The base of the appendix at its junction with the cecal wall was clearly identified. Using an Endo GIA stapler the base of the appendix is transected at its junction with the cecal wall. There is good approximation of tissue along the staple line. There is good hemostasis along the staple line. The appendix is placed into an endo-catch bag and withdrawn through  the umbilical port without difficulty. The #0-vicryl pursestring suture is tied securely.  Right lower quadrant is irrigated with warm saline which is evacuated. Good hemostasis is noted. Ports are removed under direct vision. Good hemostasis is noted at the port sites. Pneumoperitoneum is released.  Skin incisions are anesthetized with local anesthetic. Wounds are closed with interrupted 4-0 Monocryl subcuticular sutures. Wounds were washed and dried and benzoin and Steri-Strips are applied. Sterile dressings are applied. Patient is awakened from anesthesia and brought to the recovery room. The patient tolerated the procedure well.  Velora Heckler, MD, [facility] Office: [phone]

## 2012-07-20 NOTE — Transfer of Care (Signed)
Immediate Anesthesia Transfer of Care Note  Patient: Lindsay Wong  Procedure(s) Performed: Procedure(s): APPENDECTOMY LAPAROSCOPIC (Right)  Patient Location: PACU  Anesthesia Type:General  Level of Consciousness: awake, sedated and patient cooperative  Airway & Oxygen Therapy: Patient Spontanous Breathing and Patient connected to face mask oxygen  Post-op Assessment: Report given to PACU RN and Post -op Vital signs reviewed and stable  Post vital signs: Reviewed and stable  Complications: No apparent anesthesia complications

## 2012-07-20 NOTE — Patient Instructions (Addendum)
24 hours of clear fluids then advance to bland BRAT diet (bananas, rice, applesauce and toast While recovering take probiotic for next month, ie Digestive Advantage or Align    Appendicitis Appendicitis is when the appendix is swollen (inflamed). The inflammation can lead to developing a hole (perforation) and a collection of pus (abscess). CAUSES  There is not always an obvious cause of appendicitis. Sometimes it is caused by an obstruction in the appendix. The obstruction can be caused by:  A small, hard, pea-sized ball of stool (fecalith).  Enlarged lymph glands in the appendix. SYMPTOMS   Pain around your belly button (navel) that moves toward your lower right belly (abdomen). The pain can become more severe and sharp as time passes.  Tenderness in the lower right abdomen. Pain gets worse if you cough or make a sudden movement.  Feeling sick to your stomach (nauseous).  Throwing up (vomiting).  Loss of appetite.  Fever.  Constipation.  Diarrhea.  Generally not feeling well. DIAGNOSIS   Physical exam.  Blood tests.  Urine test.  X-rays or a CT scan may confirm the diagnosis. TREATMENT  Once the diagnosis of appendicitis is made, the most common treatment is to remove the appendix as soon as possible. This procedure is called appendectomy. In an open appendectomy, a cut (incision) is made in the lower right abdomen and the appendix is removed. In a laparoscopic appendectomy, usually 3 small incisions are made. Long, thin instruments and a camera tube are used to remove the appendix. Most patients go home in 24 to 48 hours after appendectomy. In some situations, the appendix may have already perforated and an abscess may have formed. The abscess may have a "wall" around it as seen on a CT scan. In this case, a drain may be placed into the abscess to remove fluid, and you may be treated with antibiotic medicines that kill germs. The medicine is given through a tube in your  vein (IV). Once the abscess has resolved, it may or may not be necessary to have an appendectomy. You may need to stay in the hospital longer than 48 hours. Document Released: 04/29/2005 Document Revised: 10/29/2011 Document Reviewed: 07/25/2009 [facility] Patient Information 2013 [place], [place].   Viral Gastroenteritis Viral gastroenteritis is also known as stomach flu. This condition affects the stomach and intestinal tract. It can cause sudden diarrhea and vomiting. The illness typically lasts 3 to 8 days. Most people develop an immune response that eventually gets rid of the virus. While this natural response develops, the virus can make you quite ill. CAUSES  Many different viruses can cause gastroenteritis, such as rotavirus or noroviruses. You can catch one of these viruses by consuming contaminated food or water. You may also catch a virus by sharing utensils or other personal items with an infected person or by touching a contaminated surface. SYMPTOMS  The most common symptoms are diarrhea and vomiting. These problems can cause a severe loss of body fluids (dehydration) and a body salt (electrolyte) imbalance. Other symptoms may include:  Fever.  Headache.  Fatigue.  Abdominal pain. DIAGNOSIS  Your caregiver can usually diagnose viral gastroenteritis based on your symptoms and a physical exam. A stool sample may also be taken to test for the presence of viruses or other infections. TREATMENT  This illness typically goes away on its own. Treatments are aimed at rehydration. The most serious cases of viral gastroenteritis involve vomiting so severely that you are not able to keep fluids down. In these  cases, fluids must be given through an intravenous line (IV). HOME CARE INSTRUCTIONS   Drink enough fluids to keep your urine clear or pale yellow. Drink small amounts of fluids frequently and increase the amounts as tolerated.  Ask your caregiver for specific rehydration  instructions.  Avoid:  Foods high in sugar.  Alcohol.  Carbonated drinks.  Tobacco.  Juice.  Caffeine drinks.  Extremely hot or cold fluids.  Fatty, greasy foods.  Too much intake of anything at one time.  Dairy products until 24 to 48 hours after diarrhea stops.  You may consume probiotics. Probiotics are active cultures of beneficial bacteria. They may lessen the amount and number of diarrheal stools in adults. Probiotics can be found in yogurt with active cultures and in supplements.  Wash your hands well to avoid spreading the virus.  Only take over-the-counter or prescription medicines for pain, discomfort, or fever as directed by your caregiver. Do not give aspirin to children. Antidiarrheal medicines are not recommended.  Ask your caregiver if you should continue to take your regular prescribed and over-the-counter medicines.  Keep all follow-up appointments as directed by your caregiver. SEEK IMMEDIATE MEDICAL CARE IF:   You are unable to keep fluids down.  You do not urinate at least once every 6 to 8 hours.  You develop shortness of breath.  You notice blood in your stool or vomit. This may look like coffee grounds.  You have abdominal pain that increases or is concentrated in one small area (localized).  You have persistent vomiting or diarrhea.  You have a fever.  The patient is a child younger than 3 months, and he or she has a fever.  The patient is a child older than 3 months, and he or she has a fever and persistent symptoms.  The patient is a child older than 3 months, and he or she has a fever and symptoms suddenly get worse.  The patient is a baby, and he or she has no tears when crying. MAKE SURE YOU:   Understand these instructions.  Will watch your condition.  Will get help right away if you are not doing well or get worse. Document Released: 04/29/2005 Document Revised: 07/22/2011 Document Reviewed: 02/13/2011 [facility] Patient  Information 2013 [place], [place].

## 2012-07-20 NOTE — ED Notes (Signed)
Pt reports abd pain yesterday with fever- CT today + for appy

## 2012-07-20 NOTE — ED Provider Notes (Signed)
History     CSN: [id number]  Arrival date & time 07/20/12  1454   First MD Initiated Contact with Patient 07/20/12 1500      Chief Complaint  Patient presents with  . Abdominal Pain    known appy    (Consider location/radiation/quality/duration/timing/severity/associated sxs/prior treatment) Patient is a 32 y.o. female presenting with abdominal pain. The history is provided by the patient.  Abdominal Pain Associated symptoms: fever   Associated symptoms: no chest pain, no cough, no dysuria and no shortness of breath   pt c/o rlq abd pain onset yesterday. Initial mid abd, now rlq. Constant, dull, non radiating. Worse w palpation. Nausea. Decreased appetite. No vomiting. Had normal bm yesterday. No abd distension. lnmp 3 weeks ago. No dysuria or gu c/o. No vaginal discharge or bleeding. Notes fever earlier today. No hx same pain. Prior abd surgery includes c section x 3.  Had outpt ct today c/w appendicitis.      Past Medical History  Diagnosis Date  . Allergy   . Asthma     Past Surgical History  Procedure Laterality Date  . Cesarean section    . Wisdom tooth extraction      No family history on file.  History  Substance Use Topics  . Smoking status: Never Smoker   . Smokeless tobacco: Never Used  . Alcohol Use: No    OB History   Grav Para Term Preterm Abortions TAB SAB Ect Mult Living                  Review of Systems  Constitutional: Positive for fever.  HENT: Negative for neck pain.   Eyes: Negative for redness.  Respiratory: Negative for cough and shortness of breath.   Cardiovascular: Negative for chest pain.  Gastrointestinal: Positive for abdominal pain.  Genitourinary: Negative for dysuria and flank pain.  Musculoskeletal: Negative for back pain.  Skin: Negative for rash.  Neurological: Negative for headaches.  Hematological: Does not bruise/bleed easily.  Psychiatric/Behavioral: Negative for confusion.    Allergies  Clarithromycin and  Latex  Home Medications  No current outpatient prescriptions on file.  BP 108/75  Pulse 84  Temp(Src) 98.3 F (36.8 C) (Oral)  Resp 20  Ht 5\' 2"  (1.575 m)  Wt 125 lb (56.7 kg)  BMI 22.86 kg/m2  SpO2 100%  LMP 06/29/2012  Physical Exam  Nursing note and vitals reviewed. Constitutional: She appears well-developed and well-nourished. No distress.  HENT:  Nose: Nose normal.  Eyes: Conjunctivae are normal. No scleral icterus.  Neck: Neck supple. No tracheal deviation present.  Cardiovascular: Normal rate.   Pulmonary/Chest: Effort normal. No respiratory distress.  Abdominal: Soft. Normal appearance and bowel sounds are normal. She exhibits no distension and no mass. There is tenderness. There is no rebound and no guarding.  Moderate rlq tenderness. +guarding.   Genitourinary:  No cva tenderness  Musculoskeletal: She exhibits no edema.  Neurological: She is alert.  Skin: Skin is warm and dry. No rash noted.  Psychiatric: She has a normal mood and affect.    ED Course  Procedures (including critical care time)  Labs Reviewed  CBC   US Abdomen Complete  07/20/2012  *RADIOLOGY REPORT*  Clinical Data:  Right lower quadrant pain with fever, nausea, and vomiting.  COMPLETE ABDOMINAL ULTRASOUND  Comparison:  None.  Findings:  Gallbladder:  No gallstones, gallbladder wall thickening, or pericholecystic fluid. Negative sonographic Murphy's sign.  Common bile duct:  Normal.  1 mm in diameter.  Liver:  No focal lesion identified.  Within normal limits in parenchymal echogenicity.  IVC:  Limited visualization.  The visualized portion is normal.  Pancreas:  Normal.  Spleen:  Normal.  7 cm in length.  Right Kidney:  Normal.  8.8 cm in length.  Left Kidney:  Normal.  9.1 cm in length.  Abdominal aorta:  Maximal diameter 1.8 cm.  IMPRESSION: Negative abdominal ultrasound.   Original Report Authenticated By: Francene Boyers, M.D.    Ct Abdomen Pelvis W Contrast  07/20/2012  *RADIOLOGY REPORT*   Clinical Data: Right lower quadrant pain, fever, nausea, and anorexia for 1 day  CT ABDOMEN AND PELVIS WITH CONTRAST  Technique:  Multidetector CT imaging of the abdomen and pelvis was performed following the standard protocol during bolus administration of intravenous contrast.  Contrast: OMNIPAQUE IOHEXOL 300 MG/ML  SOLN  Comparison: Abdominal ultrasound 07/20/2012  Findings: The lung bases are clear.  Negative for pleural effusion.  There is mild diffuse fatty infiltration of the liver. Along the falciform ligament is a focal 1.6 x 1.3 cm area of decreased attenuation that does not have mass effect on the vessels in this region, and is favored to be due to focal fatty deposition. There is no biliary ductal dilatation.  The gallbladder, spleen, adrenal glands, pancreas, and right kidney are within normal limits.  In the lower pole of the left kidney is a 3.3 x 2.6 mm area of increased density which could reflect early excretion of contrast into the collecting system, or a small nonobstructing stone.  There is no hydronephrosis or mass associated with the left kidney.  The stomach, small bowel loops, terminal ileum, and colon are normal in caliber and demonstrate normal wall thickness.  The appendix courses posteriorly from the cecum and demonstrates a diffusely enhancing and thickened wall, is fluid-filled, and has a diameter measuring up to 8 mm.  There is periappendiceal fat stranding.  Findings are consistent with acute appendicitis.  No evidence of pelvic abscess.  Tiny amount of free fluid in the dependent portion of the left aspect of the patient's pelvis is within normal limits for a female patient this age.  The aorta and branch vessels are normal in caliber and patent.  The urinary bladder is not very distended.  There is suggestion of circumferential bladder wall thickening.  This could be due to underdistension or cystitis.  Suggest correlation with urinalysis. The uterus is retroflexed and  otherwise unremarkable.  Then there is no evidence of an adnexal or ovarian mass.  Negative for abdominal wall hernia or free intraperitoneal air.  Negative for lymphadenopathy.  No acute or suspicious bony abnormality. There is mild sclerosis about the sacroiliac joints bilaterally.  IMPRESSION:  1.  Acute appendicitis. 2.  Diffuse fatty infiltration of the liver with probable focal fatty deposition along the falciform ligament. 3. Possible diffuse urinary bladder wall thickening (versus underdistention).  Cystitis cannot be excluded.  Consider correlation with urinalysis 4.  Cannot exclude a 3 mm nonobstructing stone in the lower pole of the left kidney (versus excretion of contrast material into the collecting system). 5.  Bilateral sacroiliitis.   Original Report Authenticated By: Britta Mccreedy, M.D.        MDM  Pt had outpt ct done just now, + for appendicitis.  Pt prefers [facility] system, gen Careers adviser at [employer] paged.   Iv ns bolus. Confirmed only allergy is biaxin.  Pt declines any pain and/or nausea meds.  Will facilitate transfer.  Dr Carolynne Edouard accepted in transfer  to [facility], he requests pt go to ER, he will see pt there and arrange to plan to take to OR.         Suzi Roots, MD 07/20/12 1540

## 2012-07-20 NOTE — Interval H&P Note (Signed)
History and Physical Interval Note:  07/20/2012 8:32 PM  Lindsay Wong  has presented today for surgery, with the diagnosis of appendicitis.  The various methods of treatment have been discussed with the patient and family. After consideration of risks, benefits and other options for treatment, the patient has consented to    Procedure(s): APPENDECTOMY LAPAROSCOPIC (Right) as a surgical intervention .    The patient's history has been reviewed, patient examined, no change in status, stable for surgery.  I have reviewed the patient's chart and labs.  Questions were answered to the patient's satisfaction.    Velora Heckler, MD, [facility] Office: [phone]   GERKIN,TODD Judie Petit

## 2012-07-20 NOTE — H&P (Signed)
Lindsay Wong is an 32 y.o. female.   Chief Complaint: abdominal pain HPI:  The pt is a 32 yo wf who began having periumbilical pain Saturday night and Sunday morning. Pain moved to her RLQ. She has had fever and chills with it. No diarrhea. She has also had nausea and vomiting. She had a CT today as an outpatient that showed appendicitis but no evidence of perforation.  Past Medical History  Diagnosis Date  . Allergy   . Asthma     Past Surgical History  Procedure Laterality Date  . Cesarean section    . Wisdom tooth extraction      No family history on file. Social History:  reports that she has never smoked. She has never used smokeless tobacco. She reports that she does not drink alcohol or use illicit drugs.  Allergies:  Allergies  Allergen Reactions  . Clarithromycin Shortness Of Breath    REACTION: hives  . Latex     Skin irritation from gloves     (Not in a hospital admission)  Results for orders placed during the hospital encounter of 07/20/12 (from the past 48 hour(s))  CBC     Status: Abnormal   Collection Time    07/20/12  3:17 PM      Result Value Range   WBC 12.2 (*) 4.0 - 10.5 K/uL   RBC 5.12 (*) 3.87 - 5.11 MIL/uL   Hemoglobin 14.9  12.0 - 15.0 g/dL   HCT 16.1  09.6 - 04.5 %   MCV 84.0  78.0 - 100.0 fL   MCH 29.1  26.0 - 34.0 pg   MCHC 34.7  30.0 - 36.0 g/dL   RDW 40.9  81.1 - 91.4 %   Platelets 278  150 - 400 K/uL  COMPREHENSIVE METABOLIC PANEL     Status: Abnormal   Collection Time    07/20/12  3:17 PM      Result Value Range   Sodium 135  135 - 145 mEq/L   Potassium 4.1  3.5 - 5.1 mEq/L   Chloride 97  96 - 112 mEq/L   CO2 26  19 - 32 mEq/L   Glucose, Bld 95  70 - 99 mg/dL   BUN 7  6 - 23 mg/dL   Creatinine, Ser 7.82  0.50 - 1.10 mg/dL   Calcium 95.6  8.4 - 21.3 mg/dL   Total Protein 9.0 (*) 6.0 - 8.3 g/dL   Albumin 4.5  3.5 - 5.2 g/dL   AST 11  0 - 37 U/L   ALT 9  0 - 35 U/L   Alkaline Phosphatase 90  39 - 117 U/L   Total Bilirubin 1.7  (*) 0.3 - 1.2 mg/dL   GFR calc non Af Amer 83 (*) >90 mL/min   GFR calc Af Amer >90  >90 mL/min   Comment:            The eGFR has been calculated     using the CKD EPI equation.     This calculation has not been     validated in all clinical     situations.     eGFR's persistently     <90 mL/min signify     possible Chronic Kidney Disease.  PROTIME-INR     Status: None   Collection Time    07/20/12  3:17 PM      Result Value Range   Prothrombin Time 14.0  11.6 - 15.2 seconds   INR 1.09  0.00 - 1.49  URINALYSIS, ROUTINE W REFLEX MICROSCOPIC     Status: Abnormal   Collection Time    07/20/12  3:21 PM      Result Value Range   Color, Urine YELLOW  YELLOW   APPearance CLEAR  CLEAR   Specific Gravity, Urine 1.045 (*) 1.005 - 1.030   pH 6.5  5.0 - 8.0   Glucose, UA NEGATIVE  NEGATIVE mg/dL   Hgb urine dipstick TRACE (*) NEGATIVE   Bilirubin Urine NEGATIVE  NEGATIVE   Ketones, ur NEGATIVE  NEGATIVE mg/dL   Protein, ur NEGATIVE  NEGATIVE mg/dL   Urobilinogen, UA 0.2  0.0 - 1.0 mg/dL   Nitrite NEGATIVE  NEGATIVE   Leukocytes, UA NEGATIVE  NEGATIVE  PREGNANCY, URINE     Status: None   Collection Time    07/20/12  3:21 PM      Result Value Range   Preg Test, Ur NEGATIVE  NEGATIVE   Comment:            THE SENSITIVITY OF THIS     METHODOLOGY IS >20 mIU/mL.  URINE MICROSCOPIC-ADD ON     Status: None   Collection Time    07/20/12  3:21 PM      Result Value Range   Squamous Epithelial / LPF RARE  RARE   Comment: RARE     RARE   RBC / HPF 0-2  <3 RBC/hpf   Comment: 0-2     0-2   Bacteria, UA RARE  RARE   Comment: RARE     RARE   US Abdomen Complete  07/20/2012  *RADIOLOGY REPORT*  Clinical Data:  Right lower quadrant pain with fever, nausea, and vomiting.  COMPLETE ABDOMINAL ULTRASOUND  Comparison:  None.  Findings:  Gallbladder:  No gallstones, gallbladder wall thickening, or pericholecystic fluid. Negative sonographic Murphy's sign.  Common bile duct:  Normal.  1 mm in  diameter.  Liver:  No focal lesion identified.  Within normal limits in parenchymal echogenicity.  IVC:  Limited visualization.  The visualized portion is normal.  Pancreas:  Normal.  Spleen:  Normal.  7 cm in length.  Right Kidney:  Normal.  8.8 cm in length.  Left Kidney:  Normal.  9.1 cm in length.  Abdominal aorta:  Maximal diameter 1.8 cm.  IMPRESSION: Negative abdominal ultrasound.   Original Report Authenticated By: Francene Boyers, M.D.    Ct Abdomen Pelvis W Contrast  07/20/2012  *RADIOLOGY REPORT*  Clinical Data: Right lower quadrant pain, fever, nausea, and anorexia for 1 day  CT ABDOMEN AND PELVIS WITH CONTRAST  Technique:  Multidetector CT imaging of the abdomen and pelvis was performed following the standard protocol during bolus administration of intravenous contrast.  Contrast: OMNIPAQUE IOHEXOL 300 MG/ML  SOLN  Comparison: Abdominal ultrasound 07/20/2012  Findings: The lung bases are clear.  Negative for pleural effusion.  There is mild diffuse fatty infiltration of the liver. Along the falciform ligament is a focal 1.6 x 1.3 cm area of decreased attenuation that does not have mass effect on the vessels in this region, and is favored to be due to focal fatty deposition. There is no biliary ductal dilatation.  The gallbladder, spleen, adrenal glands, pancreas, and right kidney are within normal limits.  In the lower pole of the left kidney is a 3.3 x 2.6 mm area of increased density which could reflect early excretion of contrast into the collecting system, or a small nonobstructing stone.  There is no hydronephrosis  or mass associated with the left kidney.  The stomach, small bowel loops, terminal ileum, and colon are normal in caliber and demonstrate normal wall thickness.  The appendix courses posteriorly from the cecum and demonstrates a diffusely enhancing and thickened wall, is fluid-filled, and has a diameter measuring up to 8 mm.  There is periappendiceal fat stranding.  Findings are  consistent with acute appendicitis.  No evidence of pelvic abscess.  Tiny amount of free fluid in the dependent portion of the left aspect of the patient's pelvis is within normal limits for a female patient this age.  The aorta and branch vessels are normal in caliber and patent.  The urinary bladder is not very distended.  There is suggestion of circumferential bladder wall thickening.  This could be due to underdistension or cystitis.  Suggest correlation with urinalysis. The uterus is retroflexed and otherwise unremarkable.  Then there is no evidence of an adnexal or ovarian mass.  Negative for abdominal wall hernia or free intraperitoneal air.  Negative for lymphadenopathy.  No acute or suspicious bony abnormality. There is mild sclerosis about the sacroiliac joints bilaterally.  IMPRESSION:  1.  Acute appendicitis. 2.  Diffuse fatty infiltration of the liver with probable focal fatty deposition along the falciform ligament. 3. Possible diffuse urinary bladder wall thickening (versus underdistention).  Cystitis cannot be excluded.  Consider correlation with urinalysis 4.  Cannot exclude a 3 mm nonobstructing stone in the lower pole of the left kidney (versus excretion of contrast material into the collecting system). 5.  Bilateral sacroiliitis.   Original Report Authenticated By: Britta Mccreedy, M.D.     Review of Systems  Constitutional: Positive for fever and chills.  HENT: Negative.   Eyes: Negative.   Respiratory: Negative.   Cardiovascular: Negative.   Gastrointestinal: Positive for nausea, vomiting and abdominal pain.  Genitourinary: Negative.   Musculoskeletal: Negative.   Skin: Negative.   Neurological: Negative.   Endo/Heme/Allergies: Negative.   Psychiatric/Behavioral: Negative.     Blood pressure 108/75, pulse 84, temperature 98.3 F (36.8 C), temperature source Oral, resp. rate 20, height 5\' 2"  (1.575 m), weight 125 lb (56.7 kg), last menstrual period 06/29/2012, SpO2  100.00%. Physical Exam  Constitutional: She is oriented to person, place, and time. She appears well-developed and well-nourished.  HENT:  Head: Normocephalic and atraumatic.  Eyes: Conjunctivae and EOM are normal. Pupils are equal, round, and reactive to light.  Neck: Normal range of motion. Neck supple.  Cardiovascular: Normal rate, regular rhythm and normal heart sounds.   Respiratory: Effort normal and breath sounds normal.  GI: Soft. Bowel sounds are normal.  Tender in RLQ. No guarding or peritonitis  Musculoskeletal: Normal range of motion.  Neurological: She is oriented to person, place, and time.  Skin: Skin is warm and dry.  Psychiatric: She has a normal mood and affect. Her behavior is normal.     Assessment/Plan The pt appears to have acute appendicitis. Because of the risk of perforation and sepsis I think she would benefit from having her appendix removed. I have discussed with her the risks and benefits of surgery as well as some of the technical aspects including the risk of leak and she understands and wishes to proceed  TOTH III,PAUL S 07/20/2012, 5:17 PM

## 2012-07-20 NOTE — Anesthesia Preprocedure Evaluation (Addendum)
Anesthesia Evaluation  Patient identified by MRN, date of birth, ID band Patient awake    Reviewed: Allergy & Precautions, H&P , NPO status , Patient's Chart, lab work & pertinent test results  History of Anesthesia Complications (+) PONV  Airway Mallampati: II TM Distance: >3 FB Neck ROM: Full    Dental  (+) Teeth Intact and Dental Advisory Given   Pulmonary asthma ,  breath sounds clear to auscultation  Pulmonary exam normal       Cardiovascular negative cardio ROS  Rhythm:Regular Rate:Normal     Neuro/Psych negative neurological ROS  negative psych ROS   GI/Hepatic Neg liver ROS, GERD-  ,  Endo/Other  negative endocrine ROS  Renal/GU negative Renal ROS  negative genitourinary   Musculoskeletal negative musculoskeletal ROS (+)   Abdominal   Peds  Hematology negative hematology ROS (+)   Anesthesia Other Findings   Reproductive/Obstetrics negative OB ROS                           Anesthesia Physical Anesthesia Plan  ASA: I and emergent  Anesthesia Plan: General   Post-op Pain Management:    Induction: Intravenous, Rapid sequence and Cricoid pressure planned  Airway Management Planned: Oral ETT  Additional Equipment:   Intra-op Plan:   Post-operative Plan: Extubation in OR  Informed Consent: I have reviewed the patients History and Physical, chart, labs and discussed the procedure including the risks, benefits and alternatives for the proposed anesthesia with the patient or authorized representative who has indicated his/her understanding and acceptance.   Dental advisory given  Plan Discussed with: CRNA  Anesthesia Plan Comments:         Anesthesia Quick Evaluation

## 2012-07-20 NOTE — ED Notes (Signed)
[id number]> Expected date:<BR> Expected time:<BR> Means of arrival:<BR> Comments:<BR> Hold for transfer from [facility]

## 2012-07-21 ENCOUNTER — Encounter (HOSPITAL_COMMUNITY): Payer: Self-pay | Admitting: Surgery

## 2012-07-21 LAB — HEPATIC FUNCTION PANEL
ALT: 12 U/L (ref 0–35)
AST: 14 U/L (ref 0–37)
Albumin: 5.4 g/dL — ABNORMAL HIGH (ref 3.5–5.2)
Total Protein: 8.6 g/dL — ABNORMAL HIGH (ref 6.0–8.3)

## 2012-07-21 LAB — URINALYSIS
Hgb urine dipstick: NEGATIVE
Leukocytes, UA: NEGATIVE
Protein, ur: NEGATIVE mg/dL
Urobilinogen, UA: 0.2 mg/dL (ref 0.0–1.0)

## 2012-07-21 MED ORDER — HYDROCODONE-ACETAMINOPHEN 5-325 MG PO TABS: 1.0000 | ORAL_TABLET | ORAL | Status: DC | PRN

## 2012-07-21 NOTE — Care Management Note (Signed)
    Page 1 of 1   07/21/2012     11:04:57 AM   CARE MANAGEMENT NOTE 07/21/2012  Patient:  Lindsay Wong, Lindsay Wong   Account Number:  [id number]  Date Initiated:  07/21/2012  Documentation initiated by:  Lorenda Ishihara  Subjective/Objective Assessment:   32 yo female admitted s/p lap appy. PTA lived at home with spouse.     Action/Plan:   Home when stable   Anticipated DC Date:  07/21/2012   Anticipated DC Plan:  HOME/SELF CARE      DC Planning Services  CM consult      Choice offered to / List presented to:             Status of service:  Completed, signed off Medicare Important Message given?   (If response is "NO", the following Medicare IM given date fields will be blank) Date Medicare IM given:   Date Additional Medicare IM given:    Discharge Disposition:  HOME/SELF CARE  Per UR Regulation:  Reviewed for med. necessity/level of care/duration of stay  If discussed at Long Length of Stay Meetings, dates discussed:    Comments:

## 2012-07-21 NOTE — Discharge Instructions (Signed)
[facility] ______[facility] °LAPAROSCOPIC SURGERY: POST OP INSTRUCTIONS °Always review your discharge instruction sheet given to you by the facility where your surgery was performed. °IF YOU HAVE DISABILITY OR FAMILY LEAVE FORMS, YOU MUST BRING THEM TO THE OFFICE FOR PROCESSING.   °DO NOT GIVE THEM TO YOUR DOCTOR. ° °1. A prescription for pain medication may be given to you upon discharge.  Take your pain medication as prescribed, if needed.  If narcotic pain medicine is not needed, then you may take acetaminophen (Tylenol) or ibuprofen (Advil) as needed. °2. Take your usually prescribed medications unless otherwise directed. °3. If you need a refill on your pain medication, please contact your pharmacy.  They will contact our office to request authorization. Prescriptions will not be filled after 5pm or on week-ends. °4. You should follow a light diet the first few days after arrival home, such as soup and crackers, etc.  Be sure to include lots of fluids daily. °5. Most patients will experience some swelling and bruising in the area of the incisions.  Ice packs will help.  Swelling and bruising can take several days to resolve.  °6. It is common to experience some constipation if taking pain medication after surgery.  Increasing fluid intake and taking a stool softener (such as Colace) will usually help or prevent this problem from occurring.  A mild laxative (Milk of Magnesia or Miralax) should be taken according to package instructions if there are no bowel movements after 48 hours. °7. Unless discharge instructions indicate otherwise, you may remove your bandages 24-48 hours after surgery, and you may shower at that time.  You may have steri-strips (small skin tapes) in place directly over the incision.  These strips should be left on the skin for 7-10 days.  If your surgeon used skin glue on the incision, you may shower in 24 hours.  The glue will flake off over the next 2-3 weeks.  Any sutures or  staples will be removed at the office during your follow-up visit. °8. ACTIVITIES:  You may resume regular (light) daily activities beginning the next day--such as daily self-care, walking, climbing stairs--gradually increasing activities as tolerated.  You may have sexual intercourse when it is comfortable.  Refrain from any heavy lifting or straining until approved by your doctor. °a. You may drive when you are no longer taking prescription pain medication, you can comfortably wear a seatbelt, and you can safely maneuver your car and apply brakes. °b. RETURN TO WORK:  __________________________________________________________ °9. You should see your doctor in the office for a follow-up appointment approximately 2-3 weeks after your surgery.  Make sure that you call for this appointment within a day or two after you arrive home to insure a convenient appointment time. °10. OTHER INSTRUCTIONS: __________________________________________________________________________________________________________________________ __________________________________________________________________________________________________________________________ °WHEN TO CALL YOUR DOCTOR: °1. Fever over 101.0 °2. Inability to urinate °3. Continued bleeding from incision. °4. Increased pain, redness, or drainage from the incision. °5. Increasing abdominal pain ° °The clinic staff is available to answer your questions during regular business hours.  Please don’t hesitate to call and ask to speak to one of the nurses for clinical concerns.  If you have a medical emergency, go to the nearest emergency room or call 911.  A surgeon from [facility] is always on call at the hospital. °[address], [address], [place], [place]  [address] ? [address] [address], [place], [place]   [address] °[phone] ? [phone] ? FAX [phone] °Web site:   [email or web address] °

## 2012-07-21 NOTE — Progress Notes (Signed)
Patient tolerated regular lunch. States understanding of discharge instructions. Rx for norco given.

## 2012-07-21 NOTE — Progress Notes (Signed)
Patient ID: Lindsay Wong, female   DOB: [birth year], 32 y.o.   MRN: [id number] 1 Day Post-Op  Subjective: Feels pretty good this am, tolerating diet, no significant pain, denies n/v, feels like going home, having some shoulder pain  Objective: Vital signs in last 24 hours: Temp:  [97.6 F (36.4 C)-98.6 F (37 C)] 98.4 F (36.9 C) (03/11 0627) Pulse Rate:  [70-115] 81 (03/11 0627) Resp:  [13-20] 16 (03/11 0627) BP: (100-138)/(65-88) 105/65 mmHg (03/11 0627) SpO2:  [98 %-100 %] 99 % (03/11 0627) Weight:  [125 lb (56.7 kg)] 125 lb (56.7 kg) (03/10 1503)    Intake/Output from previous day: 03/10 0701 - 03/11 0700 In: 1265 [I.V.:1065; IV Piggyback:200] Out: 1525 [Urine:1500; Blood:25] Intake/Output this shift:    PE: Abd: soft, mildly tender over incisions, incisions are c/d/i, +BS General: awake, alert, nad  Lab Results:   Recent Labs  07/20/12 1517  WBC 12.2*  HGB 14.9  HCT 43.0  PLT 278   BMET  Recent Labs  07/20/12 1517  NA 135  K 4.1  CL 97  CO2 26  GLUCOSE 95  BUN 7  CREATININE 0.90  CALCIUM 10.2   PT/INR  Recent Labs  07/20/12 1517  LABPROT 14.0  INR 1.09   CMP     Component Value Date/Time   NA 135 07/20/2012 1517   K 4.1 07/20/2012 1517   CL 97 07/20/2012 1517   CO2 26 07/20/2012 1517   GLUCOSE 95 07/20/2012 1517   BUN 7 07/20/2012 1517   CREATININE 0.90 07/20/2012 1517   CALCIUM 10.2 07/20/2012 1517   PROT 9.0* 07/20/2012 1517   ALBUMIN 4.5 07/20/2012 1517   AST 11 07/20/2012 1517   ALT 9 07/20/2012 1517   ALKPHOS 90 07/20/2012 1517   BILITOT 1.7* 07/20/2012 1517   GFRNONAA 83* 07/20/2012 1517   GFRAA >90 07/20/2012 1517   Lipase  No results found for this basename: lipase       Studies/Results: US Abdomen Complete  07/20/2012  *RADIOLOGY REPORT*  Clinical Data:  Right lower quadrant pain with fever, nausea, and vomiting.  COMPLETE ABDOMINAL ULTRASOUND  Comparison:  None.  Findings:  Gallbladder:  No gallstones, gallbladder wall  thickening, or pericholecystic fluid. Negative sonographic Murphy's sign.  Common bile duct:  Normal.  1 mm in diameter.  Liver:  No focal lesion identified.  Within normal limits in parenchymal echogenicity.  IVC:  Limited visualization.  The visualized portion is normal.  Pancreas:  Normal.  Spleen:  Normal.  7 cm in length.  Right Kidney:  Normal.  8.8 cm in length.  Left Kidney:  Normal.  9.1 cm in length.  Abdominal aorta:  Maximal diameter 1.8 cm.  IMPRESSION: Negative abdominal ultrasound.   Original Report Authenticated By: Francene Boyers, M.D.    Ct Abdomen Pelvis W Contrast  07/20/2012  *RADIOLOGY REPORT*  Clinical Data: Right lower quadrant pain, fever, nausea, and anorexia for 1 day  CT ABDOMEN AND PELVIS WITH CONTRAST  Technique:  Multidetector CT imaging of the abdomen and pelvis was performed following the standard protocol during bolus administration of intravenous contrast.  Contrast: OMNIPAQUE IOHEXOL 300 MG/ML  SOLN  Comparison: Abdominal ultrasound 07/20/2012  Findings: The lung bases are clear.  Negative for pleural effusion.  There is mild diffuse fatty infiltration of the liver. Along the falciform ligament is a focal 1.6 x 1.3 cm area of decreased attenuation that does not have mass effect on the vessels in this region, and is  favored to be due to focal fatty deposition. There is no biliary ductal dilatation.  The gallbladder, spleen, adrenal glands, pancreas, and right kidney are within normal limits.  In the lower pole of the left kidney is a 3.3 x 2.6 mm area of increased density which could reflect early excretion of contrast into the collecting system, or a small nonobstructing stone.  There is no hydronephrosis or mass associated with the left kidney.  The stomach, small bowel loops, terminal ileum, and colon are normal in caliber and demonstrate normal wall thickness.  The appendix courses posteriorly from the cecum and demonstrates a diffusely enhancing and thickened wall, is  fluid-filled, and has a diameter measuring up to 8 mm.  There is periappendiceal fat stranding.  Findings are consistent with acute appendicitis.  No evidence of pelvic abscess.  Tiny amount of free fluid in the dependent portion of the left aspect of the patient's pelvis is within normal limits for a female patient this age.  The aorta and branch vessels are normal in caliber and patent.  The urinary bladder is not very distended.  There is suggestion of circumferential bladder wall thickening.  This could be due to underdistension or cystitis.  Suggest correlation with urinalysis. The uterus is retroflexed and otherwise unremarkable.  Then there is no evidence of an adnexal or ovarian mass.  Negative for abdominal wall hernia or free intraperitoneal air.  Negative for lymphadenopathy.  No acute or suspicious bony abnormality. There is mild sclerosis about the sacroiliac joints bilaterally.  IMPRESSION:  1.  Acute appendicitis. 2.  Diffuse fatty infiltration of the liver with probable focal fatty deposition along the falciform ligament. 3. Possible diffuse urinary bladder wall thickening (versus underdistention).  Cystitis cannot be excluded.  Consider correlation with urinalysis 4.  Cannot exclude a 3 mm nonobstructing stone in the lower pole of the left kidney (versus excretion of contrast material into the collecting system). 5.  Bilateral sacroiliitis.   Original Report Authenticated By: Britta Mccreedy, M.D.     Anti-infectives: Anti-infectives   Start     Dose/Rate Route Frequency Ordered Stop   07/20/12 2200  Ampicillin-Sulbactam (UNASYN) 3 g in sodium chloride 0.9 % 100 mL IVPB     3 g 100 mL/hr over 60 Minutes Intravenous Every 6 hours 07/20/12 2136     07/20/12 1800  piperacillin-tazobactam (ZOSYN) IVPB 3.375 g     3.375 g 12.5 mL/hr over 240 Minutes Intravenous  Once 07/20/12 1724 07/20/12 2210   07/20/12 1515  Ampicillin-Sulbactam (UNASYN) 3 g in sodium chloride 0.9 % 100 mL IVPB     3 g 100  mL/hr over 60 Minutes Intravenous  Once 07/20/12 1507 07/20/12 1704       Assessment/Plan 1: POD#1-lap appy doing very well, can go home after lunch if tolerates regular diet, doesn't need abx at discharge, f/u in 2-3 weeks, call with questions or concerns.  --regular diet  --OOB  --home after lunch   LOS: 1 day    WHITE, ELIZABETH 07/21/2012

## 2012-07-21 NOTE — Discharge Summary (Signed)
Physician Discharge Summary  Patient ID: Lindsay Wong MRN: [id number] DOB/AGE: 07-23-1980 32 y.o.  Admit date: 07/20/2012 Discharge date: 07/21/2012  Admitting Diagnosis: Acute Appendicitis  Discharge Diagnosis Acute Appendicitis  Consultants None  Procedures Laparoscopic Appendectomy  Hospital Course 33 yr old female who [facility] [place] with [facility].  Workup showed appendicitis.  She was transferred to [facility].  Patient was admitted and underwent procedure listed above.  Tolerated procedure well and was transferred to the floor.  Diet was advanced as tolerated.  On POD#1, the patient was voiding well, tolerating diet, ambulating well, pain well controlled, vital signs stable, incisions c/d/i and felt stable for discharge home.  Patient will follow up in our office in 2 weeks and knows to call with questions or concerns.    Medication List    TAKE these medications       HYDROcodone-acetaminophen 5-325 MG per tablet  Commonly known as:  NORCO/VICODIN  Take 1-2 tablets by mouth every 4 (four) hours as needed.     ibuprofen 200 MG tablet  Commonly known as:  ADVIL,MOTRIN  Take 200 mg by mouth every 6 (six) hours as needed for pain.             Follow-up Information   Follow up with Velora Heckler, MD In 2 weeks. (2-3 weeks for post-op recheck)    Contact information:   [address] [place] [address] [phone]       Signed: Denny [facility] [phone]  07/21/2012, 8:21 AM

## 2012-07-22 ENCOUNTER — Telehealth (INDEPENDENT_AMBULATORY_CARE_PROVIDER_SITE_OTHER): Payer: Self-pay

## 2012-07-22 NOTE — Telephone Encounter (Signed)
Pt given po appt.

## 2012-07-23 NOTE — Anesthesia Postprocedure Evaluation (Signed)
Anesthesia Post Note  Patient: Lindsay Wong  Procedure(s) Performed: Procedure(s) (LRB): APPENDECTOMY LAPAROSCOPIC (Right)  Anesthesia type: General  Patient location: PACU  Post pain: Pain level controlled  Post assessment: Post-op Vital signs reviewed  Last Vitals:  Filed Vitals:   07/21/12 0627  BP: 105/65  Pulse: 81  Temp: 36.9 C  Resp: 16    Post vital signs: Reviewed  Level of consciousness: sedated  Complications: No apparent anesthesia complications

## 2012-07-25 ENCOUNTER — Encounter: Payer: Self-pay | Admitting: Family Medicine

## 2012-07-25 NOTE — Progress Notes (Signed)
Patient ID: Lindsay Wong, female   DOB: Jul 14, 1980, 32 y.o.   MRN: [id number] Lindsay Wong [id number] 10-02-1980 07/25/2012      Progress Note-Follow Up  Subjective  Chief Complaint  Chief Complaint  Patient presents with  . Abdominal Pain    X 2 days- RLQ pain- bowel movement yesterday monring, no appetite, no appetite, vomitted last night, feels like she needs to pass gas but can''t    HPI  Patient is a 32 year old Caucasian female who is in today for evaluation of 3 days worth of abdominal discomfort, fevers nausea vomiting. She has been struggling with low-grade fevers over the last 24 hours as well as malaise myalgias headaches. Symmetry episodes of vomiting and has persistent nausea and a sense of bloating. She abdominal pain which started periumbilical and is now outer quadrant. She has continued to move her bowels although it has been somewhat slower. She does say they're soft and formed and didn't her bowels yesterday. She struggling with significant anorexia and has not eaten since noon yesterday. Has had this for her today first thing this morning and nothing since.  Past Medical History  Diagnosis Date  . Allergy   . Asthma   . PONV (postoperative nausea and vomiting)     Past Surgical History  Procedure Laterality Date  . Cesarean section    . Wisdom tooth extraction    . Laparoscopic appendectomy Right 07/20/2012    Procedure: APPENDECTOMY LAPAROSCOPIC;  Surgeon: Velora Heckler, MD;  Location: [facility];  Service: General;  Laterality: Right;    No family history on file.  History   Social History  . Marital Status: Married    Spouse Name: N/A    Number of Children: N/A  . Years of Education: N/A   Occupational History  . Not on file.   Social History Main Topics  . Smoking status: Never Smoker   . Smokeless tobacco: Never Used  . Alcohol Use: No  . Drug Use: No  . Sexually Active: Yes    Birth Control/ Protection: None   Other Topics Concern  . Not  on file   Social History Narrative  . No narrative on file    No current outpatient prescriptions on file prior to visit.   No current facility-administered medications on file prior to visit.    Allergies  Allergen Reactions  . Clarithromycin Shortness Of Breath    REACTION: hives  . Latex     Skin irritation from gloves    Review of Systems Review of Systems  Constitutional: Positive for fever, chills and malaise/fatigue.  HENT: Negative for congestion.   Eyes: Negative for discharge.  Respiratory: Negative for shortness of breath.   Cardiovascular: Negative for chest pain, palpitations and leg swelling.  Gastrointestinal: Positive for nausea, vomiting and abdominal pain. Negative for diarrhea, constipation, blood in stool and melena.  Genitourinary: Negative for dysuria.  Musculoskeletal: Negative for falls.  Skin: Negative for rash.  Neurological: Negative for loss of consciousness and headaches.  Endo/Heme/Allergies: Negative for polydipsia.  Psychiatric/Behavioral: Negative for depression and suicidal ideas. The patient is not nervous/anxious and does not have insomnia.     Objective  BP 100/80  Pulse 115  Temp(Src) 97.6 F (36.4 C) (Oral)  Ht 5\' 2"  (1.575 m)  Wt 125 lb (56.7 kg)  BMI 22.86 kg/m2  SpO2 98%  LMP 06/29/2012  Physical Exam  Physical Exam  Constitutional: She is oriented to person, place, and time and well-developed, well-nourished, and  in no distress. No distress.  HENT:  Head: Normocephalic and atraumatic.  Eyes: Conjunctivae are normal.  Neck: Neck supple. No thyromegaly present.  Cardiovascular: Normal rate, regular rhythm and normal heart sounds.   No murmur heard. Pulmonary/Chest: Effort normal and breath sounds normal. She has no wheezes.  Abdominal: Bowel sounds are normal. She exhibits no distension and no mass. There is tenderness. There is no rebound and no guarding.  Musculoskeletal: She exhibits no edema.  Lymphadenopathy:     She has no cervical adenopathy.  Neurological: She is alert and oriented to person, place, and time.  Skin: Skin is warm and dry. No rash noted. She is not diaphoretic.  Psychiatric: Memory, affect and judgment normal.    No results found for this basename: TSH   Lab Results  Component Value Date   WBC 12.2* 07/20/2012   HGB 14.9 07/20/2012   HCT 43.0 07/20/2012   MCV 84.0 07/20/2012   PLT 278 07/20/2012   Lab Results  Component Value Date   CREATININE 0.90 07/20/2012   BUN 7 07/20/2012   NA 135 07/20/2012   K 4.1 07/20/2012   CL 97 07/20/2012   CO2 26 07/20/2012   Lab Results  Component Value Date   ALT 9 07/20/2012   AST 11 07/20/2012   ALKPHOS 90 07/20/2012   BILITOT 1.7* 07/20/2012     Assessment & Plan  Appendicitis Confirmed by CT scan and sent to the ER for definitive treatment.

## 2012-07-25 NOTE — Assessment & Plan Note (Signed)
Confirmed by CT scan and sent to the ER for definitive treatment.

## 2012-08-14 ENCOUNTER — Encounter (INDEPENDENT_AMBULATORY_CARE_PROVIDER_SITE_OTHER): Payer: Self-pay | Admitting: Surgery

## 2012-08-14 ENCOUNTER — Ambulatory Visit (INDEPENDENT_AMBULATORY_CARE_PROVIDER_SITE_OTHER): Payer: Commercial Managed Care - PPO | Admitting: Surgery

## 2012-08-14 VITALS — BP 100/64 | HR 72 | Temp 97.2°F | Resp 20 | Ht 62.0 in | Wt 126.4 lb

## 2012-08-14 DIAGNOSIS — K37 Unspecified appendicitis: Secondary | ICD-10-CM

## 2012-08-14 NOTE — Patient Instructions (Signed)
  COCOA BUTTER & VITAMIN E CREAM  ([employer] or other brand)  Apply cocoa butter/vitamin E cream to your incision 2 - 3 times daily.  Massage cream into incision for one minute with each application.  Use sunscreen (50 SPF or higher) for first 6 months after surgery if area is exposed to sun.  You may substitute Mederma or other scar reducing creams as desired.   

## 2012-08-14 NOTE — Progress Notes (Signed)
General Surgery [facility]  Visit Diagnoses: 1. Appendicitis     HISTORY: Patient returns for first postoperative visit having undergone laparoscopic appendectomy on 07/20/2012. Final pathology shows acute transmural gangrenous appendicitis. There was no evidence of malignancy.  Postoperatively the patient has done well. She denies fevers or chills. Her appetite is good. Bowel habits are normal. No recurrent episodes of pain.  EXAM: Abdomen is soft nontender without distention. Surgical wounds have healed nicely. No sign of infection. No sign of herniation. Right lower quadrant is soft and nontender.  IMPRESSION: Status post laparoscopic appendectomy for acute appendicitis  PLAN: The patient will begin applying topical creams to her incisions. She will resume physical activities. She will return as needed.  Velora Heckler, MD, FACS General & Endocrine Surgery [facility]

## 2012-09-15 ENCOUNTER — Ambulatory Visit: Payer: 59 | Admitting: Family Medicine

## 2012-09-23 ENCOUNTER — Ambulatory Visit (INDEPENDENT_AMBULATORY_CARE_PROVIDER_SITE_OTHER): Payer: 59 | Admitting: Family Medicine

## 2012-09-23 ENCOUNTER — Encounter: Payer: Self-pay | Admitting: Family Medicine

## 2012-09-23 VITALS — BP 102/78 | Temp 97.9°F | Ht 62.5 in | Wt 125.0 lb

## 2012-09-23 DIAGNOSIS — Z7189 Other specified counseling: Secondary | ICD-10-CM

## 2012-09-23 DIAGNOSIS — R0982 Postnasal drip: Secondary | ICD-10-CM

## 2012-09-23 DIAGNOSIS — J309 Allergic rhinitis, unspecified: Secondary | ICD-10-CM

## 2012-09-23 DIAGNOSIS — Z7689 Persons encountering health services in other specified circumstances: Secondary | ICD-10-CM

## 2012-09-23 LAB — BASIC METABOLIC PANEL
CO2: 26 mEq/L (ref 19–32)
Calcium: 9.4 mg/dL (ref 8.4–10.5)
Chloride: 104 mEq/L (ref 96–112)
Glucose, Bld: 74 mg/dL (ref 70–99)
Potassium: 4 mEq/L (ref 3.5–5.1)
Sodium: 137 mEq/L (ref 135–145)

## 2012-09-23 LAB — TSH: TSH: 0.55 u[IU]/mL (ref 0.35–5.50)

## 2012-09-23 MED ORDER — FLUTICASONE PROPIONATE 50 MCG/ACT NA SUSP: 2.0000 | Freq: Every day | NASAL | Status: DC

## 2012-09-23 NOTE — Progress Notes (Signed)
Chief Complaint  Patient presents with  . Establish Care    HPI:  Lindsay Wong is here to establish care.  New to getting a family doctor. Just wants a doctor for acute issues. Last PCP and physical: Sees doctor Henderson Cloud in gyn - last physical 2012. [facility] and was [facility].  Has the following chronic problems and concerns today:  Allergies/Ears itching: -chronic fluid in ears and itching throat, wheezing off and on, chronic PND -if takes zytec D does not have issues  Patient Active Problem List   Diagnosis Date Noted  . Appendicitis 07/20/2012  . Otitis media 03/14/2011  . RAYNAUD'S DISEASE 01/31/2007  . GERD 01/31/2007   Health Maintenance: -tdap - > ten years ROS: See pertinent positives and negatives per HPI.  Past Medical History  Diagnosis Date  . Allergy   . PONV (postoperative nausea and vomiting)   . Blood in stool   . Chicken pox   . Depression     postpartum with one child  . Hyperlipidemia     postpartum    Family History  Problem Relation Age of Onset  . Cancer Maternal Grandmother     melanoma  . Stroke Maternal Grandmother   . Arthritis Maternal Grandmother   . Hyperlipidemia Maternal Grandmother   . Hypertension Maternal Grandmother   . Cancer Maternal Grandfather     bladder  . Lung cancer      all but one of grandmother's siblings   . Early menopause Mother     History   Social History  . Marital Status: Married    Spouse Name: N/A    Number of Children: N/A  . Years of Education: N/A   Social History Main Topics  . Smoking status: Never Smoker   . Smokeless tobacco: Never Used  . Alcohol Use: No  . Drug Use: No  . Sexually Active: Yes    Birth Control/ Protection: None     Comment: husband had vasectomy   Other Topics Concern  . 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  GENERAL: vitals reviewed and listed above, alert, oriented, appears well hydrated and in no acute distress  HEENT: atraumatic, conjunttiva clear, no obvious abnormalities on inspection of external nose and ears, normal appearance of ear canals and TMs with clear bilat effusions, clear nasal congestion, mild post oropharyngeal erythema with PND and cobblestoning, no tonsillar edema or exudate, no sinus TTP  NECK: no obvious masses on inspection  LUNGS: clear to auscultation bilaterally, no wheezes, rales or rhonchi, good air movement  CV: HRRR, no peripheral edema  MS: moves all extremities without noticeable abnormality  PSYCH: pleasant and cooperative, no obvious depression or anxiety  SKIN: normal appearance  of scalp and hair  ASSESSMENT AND PLAN:  Discussed the following assessment and plan:  Allergic rhinitis - Plan: fluticasone (FLONASE) 50 MCG/ACT nasal spray  Post-nasal drip  Encounter to establish care - Plan: Basic metabolic panel, Hemoglobin A1c, TSH  -We reviewed the PMH, PSH, FH, SH, Meds and Allergies. -We addressed current concerns per orders and patient instructions. -We have advised patient to follow up per instructions below. -tdap vaccine given today -trial of flonase for AR/PND, if persistent ear issue will have her see ENT  -Patient advised to return or notify a doctor immediately if symptoms worsen or persist or new concerns arise.  Patient Instructions  -We have ordered  labs or studies at this visit. It can take up to 1-2 weeks for results and processing. We will contact you with instructions IF your results are abnormal. Normal results will be released to your [facility]. If you have not heard from [place] or can not find your results in [facility] in 2 weeks please contact our office.  -PLEASE SIGN UP FOR MYCHART TODAY   We recommend the following healthy lifestyle measures: - eat a healthy diet consisting of lots of vegetables, fruits, beans, nuts, seeds, healthy meats such as white chicken and fish and whole grains.  - avoid fried foods, fast food, processed foods, sodas, red meet and other fattening foods.  - get a least 150 minutes of aerobic exercise per week.   Start flonase 2 sprays each nostril for 1 month, then 1 spray each nostril. Continue the zyrtec.  See your gyn doctor about your periods.  Follow up in: 2 months for the ear issues      KIM, HANNAH R.

## 2012-09-23 NOTE — Patient Instructions (Addendum)
-  We have ordered labs or studies at this visit. It can take up to 1-2 weeks for results and processing. We will contact you with instructions IF your results are abnormal. Normal results will be released to your [facility]. If you have not heard from [place] or can not find your results in [facility] in 2 weeks please contact our office.  -PLEASE SIGN UP FOR MYCHART TODAY   We recommend the following healthy lifestyle measures: - eat a healthy diet consisting of lots of vegetables, fruits, beans, nuts, seeds, healthy meats such as white chicken and fish and whole grains.  - avoid fried foods, fast food, processed foods, sodas, red meet and other fattening foods.  - get a least 150 minutes of aerobic exercise per week.   Start flonase 2 sprays each nostril for 1 month, then 1 spray each nostril. Continue the zyrtec.  See your gyn doctor about your periods.  Follow up in: 2 months for the ear issues

## 2012-09-28 ENCOUNTER — Ambulatory Visit: Payer: 59 | Admitting: Internal Medicine

## 2012-10-23 ENCOUNTER — Encounter: Payer: Self-pay | Admitting: Family Medicine

## 2012-10-23 ENCOUNTER — Ambulatory Visit (INDEPENDENT_AMBULATORY_CARE_PROVIDER_SITE_OTHER): Payer: 59 | Admitting: Family Medicine

## 2012-10-23 VITALS — BP 120/84 | Temp 98.0°F | Wt 122.0 lb

## 2012-10-23 DIAGNOSIS — K5289 Other specified noninfective gastroenteritis and colitis: Secondary | ICD-10-CM

## 2012-10-23 DIAGNOSIS — K529 Noninfective gastroenteritis and colitis, unspecified: Secondary | ICD-10-CM

## 2012-10-23 MED ORDER — PROMETHAZINE HCL 12.5 MG PO TABS: 12.5000 mg | ORAL_TABLET | Freq: Three times a day (TID) | ORAL | Status: DC | PRN

## 2012-10-23 NOTE — Progress Notes (Addendum)
Chief Complaint  Patient presents with  . Nausea    vomiting and diarrhea; vomit was bright yellow     HPI:  Acute visit for Nausea: -had period 4 days ago and had her normal cramping with this -yesterday had watery diarrhea and nausea and vomiting yesterday, cramping abd pain -denies: fever, blood in stool, melena, hematemesis, SOB, cough, sore throat, can drink - but had had multiple episode of emesis over night and this morning -feels a little better now  ROS: See pertinent positives and negatives per HPI.  Past Medical History  Diagnosis Date  . Allergy   . PONV (postoperative nausea and vomiting)   . Blood in stool   . Chicken pox   . Depression     postpartum with one child  . Hyperlipidemia     postpartum  . Appendicitis 07/20/2012  . GERD 01/31/2007    Qualifier: Diagnosis of  By: Charlsie Quest RMA, [name]    . RAYNAUD'S DISEASE 01/31/2007    Qualifier: Diagnosis of  By: Samara Snide      Family History  Problem Relation Age of Onset  . Cancer Maternal Grandmother     melanoma  . Stroke Maternal Grandmother   . Arthritis Maternal Grandmother   . Hyperlipidemia Maternal Grandmother   . Hypertension Maternal Grandmother   . Cancer Maternal Grandfather     bladder  . Lung cancer      all but one of grandmother's siblings   . Early menopause Mother     History   Social History  . Marital Status: Married    Spouse Name: N/A    Number of Children: N/A  . Years of Education: N/A   Social History Main Topics  . Smoking status: Never Smoker   . Smokeless tobacco: Never Used  . Alcohol Use: No  . Drug Use: No  . Sexually Active: Yes    Birth Control/ Protection: None     Comment: husband had vasectomy   Other Topics Concern  . 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,7 and 3 (2014)      Home Situation: lives with husband and children      Spiritual Beliefs: none      Lifestyle: hot yoga, weights and cardio, tries to eat a  healthy diet             Current outpatient prescriptions:cetirizine-pseudoephedrine (ZYRTEC-D) 5-120 MG per tablet, Take 1 tablet by mouth 2 (two) times daily., Disp: , Rfl: ;  fluticasone (FLONASE) 50 MCG/ACT nasal spray, Place 2 sprays into the nose daily., Disp: 16 g, Rfl: 6;  promethazine (PHENERGAN) 12.5 MG tablet, Take 1 tablet (12.5 mg total) by mouth every 8 (eight) hours as needed for nausea., Disp: 20 tablet, Rfl: 0  EXAM:  Filed Vitals:   10/23/12 0924  BP: 120/84  Temp: 98 F (36.7 C)  P 86  Body mass index is 21.94 kg/(m^2).  GENERAL: vitals reviewed and listed above, alert, oriented, appears well hydrated and in no acute distress  HEENT: atraumatic, conjunttiva clear, no obvious abnormalities on inspection of external nose and ears  NECK: no obvious masses on inspection  LUNGS: clear to auscultation bilaterally, no wheezes, rales or rhonchi, good air movement  CV: HRRR, no peripheral edema  ABD: BS+, soft, mild difuse TTP, no rebound or guarding  MS: moves all extremities without noticeable abnormality  PSYCH: pleasant and cooperative, no obvious depression or anxiety  ASSESSMENT  AND PLAN:  Discussed the following assessment and plan:  Gastroenteritis - Plan: promethazine (PHENERGAN) 12.5 MG tablet  -benign exam, suspect viral gastroenteritis - discussed other potential unlikely etiologes and return/emergency precautions. Tx per orders and instructions.  -Patient advised to return or notify a doctor immediately if symptoms worsen or persist or new concerns arise.  There are no Patient Instructions on file for this visit.   Kriste Basque R.

## 2012-10-23 NOTE — Patient Instructions (Signed)
-  plenty of fluids with electrolytes (gatorade half and half with water or soup broth)  -phenergan per instructions as needed for nausea  -loperamide if needed for diarrhea  -no dairy for next 5-7 days  -follow up if worsening or not improving over the next several days

## 2012-10-26 ENCOUNTER — Telehealth: Payer: Self-pay | Admitting: Family Medicine

## 2012-10-26 NOTE — Telephone Encounter (Signed)
Patient Information:  Caller Name: Millena  Phone: [phone]  Patient: Nashea, Chumney  Gender: Female  DOB: 17-Aug-1980  Age: 32 Years  PCP: Kriste Basque [facility])  Pregnant: No  Office Follow Up:  Does the office need to follow up with this patient?: Yes  Instructions For The Office: Please make Dr.Kim aware that patient is still having symptoms after being seen in office on Friday. Better but continue to have "burning sensation".  RN Note:  Home care advice and call back parameters reviewed with patient.  Offered and an appt for evaluation. She is unsure at this time. She may try dietary changes and home care and will speak with her husband. Enocuraged her to call back for questions, changes or concerns.  Symptoms  Reason For Call & Symptoms: Patient states Last week of abdominal and vomiting last week. Seen in the office on 10/23/12. Dr. Selena Batten diagnosed her stomach virus.  She was given Rx for phenergan. +nausea. (Emesis yesterday x2 and none today) Complains "burning" in upper part of stomach for x1 week. Afebrile.  Able to walk. Feels better but continue with "burning sensation"  Reviewed Health History In EMR: Yes  Reviewed Medications In EMR: Yes  Reviewed Allergies In EMR: Yes  Reviewed Surgeries / Procedures: Yes  Date of Onset of Symptoms: 10/19/2012  Treatments Tried: Phenergan, Tylenol  Treatments Tried Worked: No OB / GYN:  LMP: 10/17/2012  Guideline(s) Used:  Abdominal Pain - Upper  Disposition Per Guideline:   See Within 2 Weeks in Office  Reason For Disposition Reached:   Intermittent burning pains radiating into chest or sour taste in mouth  Advice Given:  Fluids:   Sip clear fluids only (e.g., water, flat soft drinks, or half-strength fruit juice) until the pain is gone for 2 hours. Then slowly return to a regular diet.  Diet:  Slowly advance diet from clear liquids to a bland diet.  Avoid alcohol or caffeinated beverages.  Avoid greasy or fatty foods.  Antacid:  If having pain now, try taking an antacid (e.g., Mylanta, Maalox). Dose: 2 tablespoons (30 ml) of liquid by mouth.  Avoid NSAIDS and Aspirin  : Avoid any drug that can irritate the stomach lining and make the pain worse (especially aspirin and NSAIDs like ibuprofen).  Stop Smoking:  Smoking can aggravate heartburn and stomach problems.  Reducing Reflux Symptoms (GERD):  Eat smaller meals and avoid snacks for 2 hours before sleeping. Avoid the following foods, which tend to aggravate heartburn and stomach problems: fatty/greasy foods, spicy foods, caffeinated beverages, mints, and chocolate.  RN Overrode Recommendation:  Follow Up With Office Later  Unsure what she wants to do at this time

## 2012-11-23 ENCOUNTER — Ambulatory Visit: Payer: 59 | Admitting: Family Medicine

## 2013-03-18 ENCOUNTER — Other Ambulatory Visit: Payer: Self-pay

## 2013-05-13 HISTORY — PX: ABDOMINOPLASTY: SUR9

## 2013-05-19 ENCOUNTER — Other Ambulatory Visit: Payer: Self-pay | Admitting: Obstetrics and Gynecology

## 2014-01-14 ENCOUNTER — Ambulatory Visit (INDEPENDENT_AMBULATORY_CARE_PROVIDER_SITE_OTHER): Payer: 59 | Admitting: *Deleted

## 2014-01-14 DIAGNOSIS — Z23 Encounter for immunization: Secondary | ICD-10-CM

## 2014-06-27 ENCOUNTER — Other Ambulatory Visit (HOSPITAL_COMMUNITY): Payer: Self-pay | Admitting: Orthopedic Surgery

## 2014-06-27 DIAGNOSIS — M67912 Unspecified disorder of synovium and tendon, left shoulder: Secondary | ICD-10-CM

## 2014-07-07 ENCOUNTER — Ambulatory Visit (HOSPITAL_COMMUNITY)
Admission: RE | Admit: 2014-07-07 | Discharge: 2014-07-07 | Disposition: A | Discharge status: Home / Self Care | Payer: 59 | Source: Ambulatory Visit | Attending: Orthopedic Surgery | Admitting: Orthopedic Surgery

## 2014-07-07 DIAGNOSIS — M25512 Pain in left shoulder: Secondary | ICD-10-CM | POA: Insufficient documentation

## 2014-07-07 DIAGNOSIS — M67912 Unspecified disorder of synovium and tendon, left shoulder: Secondary | ICD-10-CM

## 2014-07-19 DIAGNOSIS — M25512 Pain in left shoulder: Secondary | ICD-10-CM | POA: Insufficient documentation

## 2014-09-06 ENCOUNTER — Ambulatory Visit: Payer: 59 | Admitting: Family Medicine

## 2014-09-07 ENCOUNTER — Encounter: Payer: Self-pay | Admitting: Family Medicine

## 2014-09-07 ENCOUNTER — Ambulatory Visit (INDEPENDENT_AMBULATORY_CARE_PROVIDER_SITE_OTHER): Payer: 59 | Admitting: Family Medicine

## 2014-09-07 VITALS — BP 100/78 | HR 70 | Temp 98.5°F | Ht 62.5 in | Wt 131.2 lb

## 2014-09-07 DIAGNOSIS — H6502 Acute serous otitis media, left ear: Secondary | ICD-10-CM

## 2014-09-07 DIAGNOSIS — J020 Streptococcal pharyngitis: Secondary | ICD-10-CM

## 2014-09-07 MED ORDER — AMOXICILLIN 875 MG PO TABS: 875.0000 mg | ORAL_TABLET | Freq: Two times a day (BID) | ORAL | Status: DC

## 2014-09-07 NOTE — Progress Notes (Signed)
HPI:  Sore throat: -started: 5 days ago -symptoms:irritated throat, fever 100.8 for a few days, HA, upset stomach - now improving and no fever today, she also has had L ear pain which has persisted -denies:fever, SOB, NVD, tooth pain -has tried:  nothing -sick contacts/travel/risks: son had strep last week; confirmed on testing and treated -Hx of: allergies   ROS: See pertinent positives and negatives per HPI.  Past Medical History  Diagnosis Date  . Allergy   . PONV (postoperative nausea and vomiting)   . Blood in stool   . Chicken pox   . Depression     postpartum with one child  . Hyperlipidemia     postpartum  . Appendicitis 07/20/2012  . GERD 01/31/2007    Qualifier: Diagnosis of  By: [name] RMA, [name]    . RAYNAUD'S DISEASE 01/31/2007    Qualifier: Diagnosis of  By: Samara SnideBrand RMA, [name]      Past Surgical History  Procedure Laterality Date  . Cesarean section    . Wisdom tooth extraction    . Laparoscopic appendectomy Right 07/20/2012    Procedure: APPENDECTOMY LAPAROSCOPIC;  Surgeon: [name], MD;  Location: [facility];  Service: General;  Laterality: Right;    Family History  Problem Relation Age of Onset  . Cancer Maternal Grandmother     melanoma  . Stroke Maternal Grandmother   . Arthritis Maternal Grandmother   . Hyperlipidemia Maternal Grandmother   . Hypertension Maternal Grandmother   . Cancer Maternal Grandfather     bladder  . Lung cancer      all but one of grandmother's siblings   . Early menopause Mother     History   Social History  . Marital Status: Married    Spouse Name: N/A  . Number of Children: N/A  . Years of Education: N/A   Social History Main Topics  . Smoking status: Never Smoker   . Smokeless tobacco: Never Used  . Alcohol Use: No  . Drug Use: No  . Sexual Activity: Yes    Birth Control/ Protection: None     Comment: husband had vasectomy   Other Topics Concern  . 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  cetirizine-pseudoephedrine (ZYRTEC-D) 5-120 MG per tablet, Take 1 tablet by mouth 2 (two) times daily., Disp: , Rfl:  .  fluticasone (FLONASE) 50 MCG/ACT nasal spray, Place 2 sprays into the nose daily., Disp: 16 g, Rfl: 6 .  lamoTRIgine (LAMICTAL) 150 MG tablet, Take 150 mg by mouth daily., Disp: , Rfl:  .  sertraline (ZOLOFT) 50 MG tablet, Take 50 mg by mouth daily., Disp: , Rfl:  .  amoxicillin (AMOXIL) 875 MG tablet, Take 1 tablet (875 mg total) by mouth 2 (two) times daily., Disp: 20 tablet, Rfl: 0  EXAM:  Filed Vitals:   09/07/14 1047  BP: 100/78  Pulse: 70  Temp: 98.5 F (36.9 C)    Body mass index is 23.6 kg/(m^2).  GENERAL: vitals reviewed and listed above, alert, oriented, appears well hydrated and in no acute distress  HEENT: atraumatic, conjunttiva clear, no  obvious abnormalities on inspection of external nose and ears, normal appearance of ear canals and TMs except for bulging L TM with effusion, mild post oropharyngeal erythema with PND, no tonsillar edema or exudate, no sinus TTP  NECK: no obvious masses on inspection  LUNGS: clear to auscultation bilaterally, no wheezes, rales or rhonchi, good air movement  CV: HRRR, no peripheral edema  MS: moves all extremities without noticeable abnormality  PSYCH: pleasant and cooperative, no obvious depression or anxiety  ASSESSMENT AND PLAN:  Discussed the following assessment and plan:  Acute serous otitis media of left ear, recurrence not specified - Plan: amoxicillin (AMOXIL) 875 MG tablet  Streptococcal sore throat - Plan: amoxicillin (AMOXIL) 875 MG tablet  -exposure to strep with symptoms concerning for strep now resolving -otitis media L on exam -opted for tx  with amox after discussed risks/benefits/complications of strep inf -of course, we advised to return or notify a doctor immediately if symptoms worsen or persist or new concerns arise.    There are no Patient Instructions on file for this visit.   Lindsay Wong R.

## 2014-09-07 NOTE — Progress Notes (Signed)
Pre visit review using our clinic review tool, if applicable. No additional management support is needed unless otherwise documented below in the visit note. 

## 2014-10-17 ENCOUNTER — Encounter: Payer: Self-pay | Admitting: Family Medicine

## 2014-10-17 ENCOUNTER — Ambulatory Visit (INDEPENDENT_AMBULATORY_CARE_PROVIDER_SITE_OTHER): Payer: 59 | Admitting: Family Medicine

## 2014-10-17 VITALS — BP 102/80 | HR 90 | Temp 97.7°F | Ht 62.5 in | Wt 134.3 lb

## 2014-10-17 DIAGNOSIS — M6208 Separation of muscle (nontraumatic), other site: Secondary | ICD-10-CM

## 2014-10-17 NOTE — Progress Notes (Signed)
Pre visit review using our clinic review tool, if applicable. No additional management support is needed unless otherwise documented below in the visit note. 

## 2014-10-17 NOTE — Progress Notes (Signed)
HPI:  Acute visit for:  ? Hernia: -hx C/S and lap appendectmy -reports hx of diastasis recti - but feels like more of a bulge in upper stomach when lifting, doing weights -reports is reducible -wanted to check for hernia -denies: sig pain, changes in bowels, nausea, vomiting, diarrhea  ROS: See pertinent positives and negatives per HPI.  Past Medical History  Diagnosis Date  . Allergy   . PONV (postoperative nausea and vomiting)   . Blood in stool   . Chicken pox   . Depression     postpartum with one child  . Hyperlipidemia     postpartum  . Appendicitis 07/20/2012  . GERD 01/31/2007    Qualifier: Diagnosis of  By: Charlsie Quest RMA, [name]    . RAYNAUD'S DISEASE 01/31/2007    Qualifier: Diagnosis of  By: Samara Snide      Past Surgical History  Procedure Laterality Date  . Cesarean section    . Wisdom tooth extraction    . Laparoscopic appendectomy Right 07/20/2012    Procedure: APPENDECTOMY LAPAROSCOPIC;  Surgeon: Velora Heckler, MD;  Location: [facility];  Service: General;  Laterality: Right;    Family History  Problem Relation Age of Onset  . Cancer Maternal Grandmother     melanoma  . Stroke Maternal Grandmother   . Arthritis Maternal Grandmother   . Hyperlipidemia Maternal Grandmother   . Hypertension Maternal Grandmother   . Cancer Maternal Grandfather     bladder  . Lung cancer      all but one of grandmother's siblings   . Early menopause Mother     History   Social History  . Marital Status: Married    Spouse Name: N/A  . Number of Children: N/A  . Years of Education: N/A   Social History Main Topics  . Smoking status: Never Smoker   . Smokeless tobacco: Never Used  . Alcohol Use: No  . Drug Use: No  . Sexual Activity: Yes    Birth Control/ Protection: None     Comment: husband had vasectomy   Other Topics Concern  . 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  cetirizine-pseudoephedrine (ZYRTEC-D) 5-120 MG per tablet, Take 1 tablet by mouth 2 (two) times daily., Disp: , Rfl:  .  fluticasone (FLONASE) 50 MCG/ACT nasal spray, Place 2 sprays into the nose daily., Disp: 16 g, Rfl: 6 .  sertraline (ZOLOFT) 50 MG tablet, Take 50 mg by mouth daily., Disp: , Rfl:   EXAM:  Filed Vitals:   10/17/14 1047  BP: 102/80  Pulse: 90  Temp: 97.7 F (36.5 C)    Body mass index is 24.16 kg/(m^2).  GENERAL: vitals reviewed and listed above, alert, oriented, appears well hydrated and in no acute distress  HEENT: atraumatic, conjunttiva clear, no obvious abnormalities on inspection of external nose and ears  ABD: mild diastasis recti  CV: HRRR, no peripheral edema  MS: moves all extremities without noticeable abnormality  PSYCH: pleasant and cooperative, no obvious depression or anxiety  ASSESSMENT AND PLAN:  Discussed the following assessment and plan:  Diastasis recti  -  no hernia appreciated on exam today -she is concerned about this cosmetically and offered referral to plastic surgeon for discussion of options -she prefers to check to see if this is something her general surgeon can do, and plans to call back if needs referral -Patient advised to return or notify a doctor immediately if symptoms worsen or persist or new concerns arise.  Patient Instructions  Let us know if worsening or if you decide to see the plastic surgeon     Lindsay Wong, Rushi Chasen R.

## 2014-10-17 NOTE — Patient Instructions (Signed)
Let us know if worsening or if you decide to see the plastic surgeon

## 2015-02-06 ENCOUNTER — Ambulatory Visit: Payer: 59 | Admitting: *Deleted

## 2015-02-08 ENCOUNTER — Ambulatory Visit (INDEPENDENT_AMBULATORY_CARE_PROVIDER_SITE_OTHER): Payer: 59 | Admitting: *Deleted

## 2015-02-08 DIAGNOSIS — Z23 Encounter for immunization: Secondary | ICD-10-CM | POA: Diagnosis not present

## 2015-04-27 ENCOUNTER — Ambulatory Visit: Payer: 59 | Admitting: Family Medicine

## 2015-06-07 DIAGNOSIS — F411 Generalized anxiety disorder: Secondary | ICD-10-CM | POA: Diagnosis not present

## 2015-06-07 DIAGNOSIS — F39 Unspecified mood [affective] disorder: Secondary | ICD-10-CM | POA: Diagnosis not present

## 2015-06-08 MED FILL — LORazepam 1 MG TABS: 1 | 30 days supply | Qty: 30 | Fill #1

## 2015-06-16 DIAGNOSIS — R5383 Other fatigue: Secondary | ICD-10-CM | POA: Diagnosis not present

## 2015-06-20 DIAGNOSIS — F411 Generalized anxiety disorder: Secondary | ICD-10-CM | POA: Diagnosis not present

## 2015-06-20 DIAGNOSIS — F39 Unspecified mood [affective] disorder: Secondary | ICD-10-CM | POA: Diagnosis not present

## 2015-07-19 MED FILL — LATUDA 20 MG TABLET: 20 | 30 days supply | Qty: 30 | Fill #0

## 2015-07-31 MED FILL — SERTRALINE HCL 50 MG TABLET: 50 | 90 days supply | Qty: 90 | Fill #1

## 2015-07-31 MED FILL — LORazepam 1 MG TABS: 1 | 30 days supply | Qty: 30 | Fill #2

## 2015-08-15 DIAGNOSIS — F39 Unspecified mood [affective] disorder: Secondary | ICD-10-CM | POA: Diagnosis not present

## 2015-08-15 DIAGNOSIS — F411 Generalized anxiety disorder: Secondary | ICD-10-CM | POA: Diagnosis not present

## 2015-09-05 MED FILL — LATUDA 20 MG TABLET: 20 | 30 days supply | Qty: 30 | Fill #1

## 2016-01-04 DIAGNOSIS — F411 Generalized anxiety disorder: Secondary | ICD-10-CM | POA: Diagnosis not present

## 2016-01-04 DIAGNOSIS — F39 Unspecified mood [affective] disorder: Secondary | ICD-10-CM | POA: Diagnosis not present

## 2016-02-19 DIAGNOSIS — Z01812 Encounter for preprocedural laboratory examination: Secondary | ICD-10-CM | POA: Diagnosis not present

## 2016-02-20 ENCOUNTER — Ambulatory Visit (INDEPENDENT_AMBULATORY_CARE_PROVIDER_SITE_OTHER): Payer: 59

## 2016-02-20 DIAGNOSIS — Z23 Encounter for immunization: Secondary | ICD-10-CM

## 2016-03-05 MED FILL — CEPHALEXIN 500 MG CAPSULE: 500 | 7 days supply | Qty: 21 | Fill #0

## 2016-03-05 MED FILL — SCOPOLAMINE 1 MG/3 DAY PATC: 1 | 1 days supply | Qty: 1 | Fill #0

## 2016-03-05 MED FILL — PROMETHAZINE 25 MG TABLET: 25 | 7 days supply | Qty: 10 | Fill #0

## 2016-03-05 MED FILL — ONDANSETRON ODT 8 MG TABLET: 8 | 1 days supply | Qty: 1 | Fill #0

## 2016-03-05 MED FILL — HYDROCODON-APAP 10-325: 10-325 | 3 days supply | Qty: 30 | Fill #0

## 2016-04-08 DIAGNOSIS — F39 Unspecified mood [affective] disorder: Secondary | ICD-10-CM | POA: Diagnosis not present

## 2016-04-08 DIAGNOSIS — F411 Generalized anxiety disorder: Secondary | ICD-10-CM | POA: Diagnosis not present

## 2016-05-23 DIAGNOSIS — F332 Major depressive disorder, recurrent severe without psychotic features: Secondary | ICD-10-CM | POA: Diagnosis not present

## 2016-05-24 DIAGNOSIS — F332 Major depressive disorder, recurrent severe without psychotic features: Secondary | ICD-10-CM | POA: Diagnosis not present

## 2016-05-27 DIAGNOSIS — F332 Major depressive disorder, recurrent severe without psychotic features: Secondary | ICD-10-CM | POA: Diagnosis not present

## 2016-05-28 DIAGNOSIS — F332 Major depressive disorder, recurrent severe without psychotic features: Secondary | ICD-10-CM | POA: Diagnosis not present

## 2016-05-31 DIAGNOSIS — F332 Major depressive disorder, recurrent severe without psychotic features: Secondary | ICD-10-CM | POA: Diagnosis not present

## 2016-06-01 DIAGNOSIS — F332 Major depressive disorder, recurrent severe without psychotic features: Secondary | ICD-10-CM | POA: Diagnosis not present

## 2016-06-03 DIAGNOSIS — F332 Major depressive disorder, recurrent severe without psychotic features: Secondary | ICD-10-CM | POA: Diagnosis not present

## 2016-06-04 DIAGNOSIS — F332 Major depressive disorder, recurrent severe without psychotic features: Secondary | ICD-10-CM | POA: Diagnosis not present

## 2016-06-05 DIAGNOSIS — F332 Major depressive disorder, recurrent severe without psychotic features: Secondary | ICD-10-CM | POA: Diagnosis not present

## 2016-06-06 DIAGNOSIS — F332 Major depressive disorder, recurrent severe without psychotic features: Secondary | ICD-10-CM | POA: Diagnosis not present

## 2016-06-07 DIAGNOSIS — F332 Major depressive disorder, recurrent severe without psychotic features: Secondary | ICD-10-CM | POA: Diagnosis not present

## 2016-06-10 DIAGNOSIS — F332 Major depressive disorder, recurrent severe without psychotic features: Secondary | ICD-10-CM | POA: Diagnosis not present

## 2016-06-11 DIAGNOSIS — F332 Major depressive disorder, recurrent severe without psychotic features: Secondary | ICD-10-CM | POA: Diagnosis not present

## 2016-06-12 DIAGNOSIS — F332 Major depressive disorder, recurrent severe without psychotic features: Secondary | ICD-10-CM | POA: Diagnosis not present

## 2016-06-13 DIAGNOSIS — F332 Major depressive disorder, recurrent severe without psychotic features: Secondary | ICD-10-CM | POA: Diagnosis not present

## 2016-06-14 DIAGNOSIS — F332 Major depressive disorder, recurrent severe without psychotic features: Secondary | ICD-10-CM | POA: Diagnosis not present

## 2016-06-17 DIAGNOSIS — F332 Major depressive disorder, recurrent severe without psychotic features: Secondary | ICD-10-CM | POA: Diagnosis not present

## 2016-06-18 DIAGNOSIS — F332 Major depressive disorder, recurrent severe without psychotic features: Secondary | ICD-10-CM | POA: Diagnosis not present

## 2016-06-19 DIAGNOSIS — F332 Major depressive disorder, recurrent severe without psychotic features: Secondary | ICD-10-CM | POA: Diagnosis not present

## 2016-06-20 DIAGNOSIS — F332 Major depressive disorder, recurrent severe without psychotic features: Secondary | ICD-10-CM | POA: Diagnosis not present

## 2016-06-21 DIAGNOSIS — F332 Major depressive disorder, recurrent severe without psychotic features: Secondary | ICD-10-CM | POA: Diagnosis not present

## 2016-06-24 DIAGNOSIS — F332 Major depressive disorder, recurrent severe without psychotic features: Secondary | ICD-10-CM | POA: Diagnosis not present

## 2016-06-25 ENCOUNTER — Ambulatory Visit (INDEPENDENT_AMBULATORY_CARE_PROVIDER_SITE_OTHER): Payer: 59 | Admitting: Family Medicine

## 2016-06-25 ENCOUNTER — Encounter: Payer: Self-pay | Admitting: Family Medicine

## 2016-06-25 VITALS — BP 118/82 | HR 100 | Temp 101.5°F | Ht 62.5 in | Wt 126.8 lb

## 2016-06-25 DIAGNOSIS — J111 Influenza due to unidentified influenza virus with other respiratory manifestations: Secondary | ICD-10-CM

## 2016-06-25 MED ORDER — OSELTAMIVIR PHOSPHATE 75 MG PO CAPS: 75.0000 mg | ORAL_CAPSULE | Freq: Two times a day (BID) | ORAL | 0 refills | Status: DC

## 2016-06-25 MED ORDER — HYDROCODONE-HOMATROPINE 5-1.5 MG/5ML PO SYRP
5.0000 mL | ORAL_SOLUTION | Freq: Three times a day (TID) | ORAL | 0 refills | Status: DC | PRN
Start: 2016-06-25 — End: 2017-05-19

## 2016-06-25 NOTE — Patient Instructions (Signed)

## 2016-06-25 NOTE — Progress Notes (Signed)
Pre visit review using our clinic review tool, if applicable. No additional management support is needed unless otherwise documented below in the visit note. 

## 2016-06-25 NOTE — Progress Notes (Signed)
HPI:  Acute visit for flu like symptoms: -started: yesterday acutely, husband with the flu -symptoms:nasal congestion, sore throat, cough, body aches, fever, emesis x1 yesterday -denies:SOB, diarrhea, repetitive emesis, tooth pain -has tried: nothing -requests tamiflu as has 3 children at home  ROS: See pertinent positives and negatives per HPI.  Past Medical History:  Diagnosis Date  . Allergy   . Appendicitis 07/20/2012  . Blood in stool   . Chicken pox   . Depression    postpartum with one child  . GERD 01/31/2007   Qualifier: Diagnosis of  By: [name] RMA, [name]    . Hyperlipidemia    postpartum  . PONV (postoperative nausea and vomiting)   . RAYNAUD'S DISEASE 01/31/2007   Qualifier: Diagnosis of  By: Samara SnideBrand RMA, [name]      Past Surgical History:  Procedure Laterality Date  . CESAREAN SECTION    . LAPAROSCOPIC APPENDECTOMY Right 07/20/2012   Procedure: APPENDECTOMY LAPAROSCOPIC;  Surgeon: [name], MD;  Location: [facility];  Service: General;  Laterality: Right;  . WISDOM TOOTH EXTRACTION      Family History  Problem Relation Age of Onset  . Cancer Maternal Grandmother     melanoma  . Stroke Maternal Grandmother   . Arthritis Maternal Grandmother   . Hyperlipidemia Maternal Grandmother   . Hypertension Maternal Grandmother   . Cancer Maternal Grandfather     bladder  . Lung cancer      all but one of grandmother's siblings   . Early menopause Mother     Social History   Social History  . Marital status: Married    Spouse name: N/A  . Number of children: N/A  . Years of education: N/A   Social History Main Topics  . Smoking status: Never Smoker  . Smokeless tobacco: Never Used  . Alcohol use No  . Drug use: No  . Sexual activity: Yes    Birth control/ protection: None     Comment: husband had vasectomy   Other Topics Concern  . 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  HYDROcodone-homatropine (HYCODAN) 5-1.5 MG/5ML syrup, Take 5 mLs by mouth every 8 (eight) hours as needed for cough., Disp: 120 mL, Rfl: 0 .  oseltamivir (TAMIFLU) 75 MG capsule, Take 1 capsule (75 mg total) by mouth 2 (two) times daily., Disp: 10 capsule, Rfl: 0  EXAM:  Vitals:   06/25/16 1025  BP: 118/82  Pulse: 100  Temp: (!) 101.5 F (38.6 C)    Body mass index is 22.82 kg/m.  GENERAL: vitals reviewed and listed above, alert, oriented, appears well hydrated and in no acute distress  HEENT: atraumatic, conjunttiva clear, no obvious abnormalities on inspection of external nose and ears, normal appearance of ear canals and TMs, clear nasal congestion, mild post oropharyngeal erythema with PND, no tonsillar edema or exudate, no sinus TTP  NECK: no obvious masses on inspection  LUNGS: clear to auscultation bilaterally, no wheezes, rales or rhonchi, good air movement  CV: HRRR, no peripheral edema  MS: moves all extremities without noticeable abnormality  PSYCH: pleasant and cooperative,  no obvious depression or anxiety  ASSESSMENT AND PLAN:  Discussed the following assessment and plan:  Influenza  We discussed potential/likely etiologies, with influenza being likely or possible vs. viral infection or other. We discussed risks/benefits/indications/best timing for tamiflu, likely course, transmission, potential complications, signs of developing a serious illness and return and emergency precuations. She opted for rx for tamiflu and hycodan cough syrup.   Patient Instructions  Influenza, Adult Influenza ("the flu") is an infection in the lungs, nose, and throat (respiratory tract). It is caused by a virus. The flu causes many common cold symptoms, as well as a high fever and body aches. It  can make you feel very sick. The flu spreads easily from person to person (is contagious). Getting a flu shot (influenza vaccination) every year is the best way to prevent the flu. Follow these instructions at home:  Take over-the-counter and prescription medicines only as told by your doctor.  Use a cool mist humidifier to add moisture (humidity) to the air in your home. This can make it easier to breathe.  Rest as needed.  Drink enough fluid to keep your pee (urine) clear or pale yellow.  Cover your mouth and nose when you cough or sneeze.  Wash your hands with soap and water often, especially after you cough or sneeze. If you cannot use soap and water, use hand sanitizer.  Stay home from work or school as told by your doctor. Unless you are visiting your doctor, try to avoid leaving home until your fever has been gone for 24 hours without the use of medicine.  Keep all follow-up visits as told by your doctor. This is important. How is this prevented?  Getting a yearly (annual) flu shot is the best way to avoid getting the flu. You may get the flu shot in late summer, fall, or winter. Ask your doctor when you should get your flu shot.  Wash your hands often or use hand sanitizer often.  Avoid contact with people who are sick during cold and flu season.  Eat healthy foods.  Drink plenty of fluids.  Get enough sleep.  Exercise regularly. Contact a doctor if:  You get new symptoms.  You have:  Chest pain.  Watery poop (diarrhea).  A fever.  Your cough gets worse.  You start to have more mucus.  You feel sick to your stomach (nauseous).  You throw up (vomit). Get help right away if:  You start to be short of breath or have trouble breathing.  Your skin or nails turn a bluish color.  You have very bad pain or stiffness in your neck.  You get a sudden headache.  You get sudden pain in your face or ear.  You cannot stop throwing up. This information is not  intended to replace advice given to you by your health care provider. Make sure you discuss any questions you have with your health care provider. Document Released: 02/06/2008 Document Revised: 10/05/2015 Document Reviewed: 02/21/2015 Elsevier Interactive Patient Education  [address]      [place], Dahlia Client R., DO

## 2016-07-01 DIAGNOSIS — F332 Major depressive disorder, recurrent severe without psychotic features: Secondary | ICD-10-CM | POA: Diagnosis not present

## 2016-07-02 DIAGNOSIS — F332 Major depressive disorder, recurrent severe without psychotic features: Secondary | ICD-10-CM | POA: Diagnosis not present

## 2016-07-03 DIAGNOSIS — F332 Major depressive disorder, recurrent severe without psychotic features: Secondary | ICD-10-CM | POA: Diagnosis not present

## 2016-07-04 DIAGNOSIS — F332 Major depressive disorder, recurrent severe without psychotic features: Secondary | ICD-10-CM | POA: Diagnosis not present

## 2016-07-05 DIAGNOSIS — F332 Major depressive disorder, recurrent severe without psychotic features: Secondary | ICD-10-CM | POA: Diagnosis not present

## 2016-07-08 DIAGNOSIS — F332 Major depressive disorder, recurrent severe without psychotic features: Secondary | ICD-10-CM | POA: Diagnosis not present

## 2016-07-09 DIAGNOSIS — F332 Major depressive disorder, recurrent severe without psychotic features: Secondary | ICD-10-CM | POA: Diagnosis not present

## 2016-07-10 DIAGNOSIS — F332 Major depressive disorder, recurrent severe without psychotic features: Secondary | ICD-10-CM | POA: Diagnosis not present

## 2016-07-15 DIAGNOSIS — F332 Major depressive disorder, recurrent severe without psychotic features: Secondary | ICD-10-CM | POA: Diagnosis not present

## 2016-07-17 DIAGNOSIS — F332 Major depressive disorder, recurrent severe without psychotic features: Secondary | ICD-10-CM | POA: Diagnosis not present

## 2016-07-19 DIAGNOSIS — F332 Major depressive disorder, recurrent severe without psychotic features: Secondary | ICD-10-CM | POA: Diagnosis not present

## 2016-07-23 DIAGNOSIS — F332 Major depressive disorder, recurrent severe without psychotic features: Secondary | ICD-10-CM | POA: Diagnosis not present

## 2016-07-24 MED FILL — CEPHALEXIN 500 MG CAPSULE: 500 | 3 days supply | Qty: 9 | Fill #0

## 2016-07-24 MED FILL — PROMETHAZINE 12.5 MG TABLET: 12.5 | 3 days supply | Qty: 10 | Fill #0

## 2016-07-25 DIAGNOSIS — F332 Major depressive disorder, recurrent severe without psychotic features: Secondary | ICD-10-CM | POA: Diagnosis not present

## 2016-07-30 DIAGNOSIS — F332 Major depressive disorder, recurrent severe without psychotic features: Secondary | ICD-10-CM | POA: Diagnosis not present

## 2016-08-29 DIAGNOSIS — F332 Major depressive disorder, recurrent severe without psychotic features: Secondary | ICD-10-CM | POA: Diagnosis not present

## 2016-09-19 ENCOUNTER — Other Ambulatory Visit: Payer: Self-pay | Admitting: Obstetrics and Gynecology

## 2016-09-19 DIAGNOSIS — Z01419 Encounter for gynecological examination (general) (routine) without abnormal findings: Secondary | ICD-10-CM | POA: Diagnosis not present

## 2016-09-19 DIAGNOSIS — Z124 Encounter for screening for malignant neoplasm of cervix: Secondary | ICD-10-CM | POA: Diagnosis not present

## 2016-09-19 MED FILL — FLUoxetine HCL 10 MG CAPS: 10 | 90 days supply | Qty: 90 | Fill #0

## 2016-09-23 LAB — CYTOLOGY - PAP

## 2016-10-02 DIAGNOSIS — F411 Generalized anxiety disorder: Secondary | ICD-10-CM | POA: Diagnosis not present

## 2016-10-02 DIAGNOSIS — F332 Major depressive disorder, recurrent severe without psychotic features: Secondary | ICD-10-CM | POA: Diagnosis not present

## 2016-10-15 DIAGNOSIS — F332 Major depressive disorder, recurrent severe without psychotic features: Secondary | ICD-10-CM | POA: Diagnosis not present

## 2016-11-04 ENCOUNTER — Ambulatory Visit (HOSPITAL_COMMUNITY): Payer: 59 | Admitting: Psychiatry

## 2016-11-05 MED FILL — GABAPENTIN 100 MG CAPSULE: 100 | 20 days supply | Qty: 120 | Fill #0

## 2016-11-12 DIAGNOSIS — F332 Major depressive disorder, recurrent severe without psychotic features: Secondary | ICD-10-CM | POA: Diagnosis not present

## 2016-11-14 MED FILL — CITALOPRAM HBR 20 MG TABLET: 20 | 30 days supply | Qty: 30 | Fill #0

## 2016-11-21 MED FILL — GABAPENTIN 100 MG CAPSULE: 100 | 30 days supply | Qty: 180 | Fill #0

## 2016-12-26 MED FILL — CITALOPRAM HBR 20 MG TABLET: 20 | 30 days supply | Qty: 30 | Fill #1

## 2017-01-07 DIAGNOSIS — F332 Major depressive disorder, recurrent severe without psychotic features: Secondary | ICD-10-CM | POA: Diagnosis not present

## 2017-01-07 DIAGNOSIS — F411 Generalized anxiety disorder: Secondary | ICD-10-CM | POA: Diagnosis not present

## 2017-01-07 MED FILL — CITALOPRAM HBR 10 MG TABLET: 10 | 30 days supply | Qty: 90 | Fill #0

## 2017-01-07 MED FILL — GABAPENTIN 300 MG CAPSULE: 300 | 30 days supply | Qty: 90 | Fill #0

## 2017-01-30 ENCOUNTER — Encounter: Payer: Self-pay | Admitting: Family Medicine

## 2017-02-03 DIAGNOSIS — F332 Major depressive disorder, recurrent severe without psychotic features: Secondary | ICD-10-CM | POA: Diagnosis not present

## 2017-02-03 MED FILL — CITALOPRAM HBR 10 MG TABLET: 10 | 30 days supply | Qty: 90 | Fill #0

## 2017-02-04 MED FILL — GABAPENTIN 300 MG CAPSULE: 300 | 30 days supply | Qty: 90 | Fill #0

## 2017-02-12 ENCOUNTER — Ambulatory Visit (INDEPENDENT_AMBULATORY_CARE_PROVIDER_SITE_OTHER): Payer: 59

## 2017-02-12 DIAGNOSIS — Z23 Encounter for immunization: Secondary | ICD-10-CM

## 2017-03-27 MED FILL — CITALOPRAM HBR 10 MG TABLET: 10 | 30 days supply | Qty: 90 | Fill #1

## 2017-03-27 MED FILL — GABAPENTIN 300 MG CAPSULE: 300 | 30 days supply | Qty: 90 | Fill #1

## 2017-03-31 DIAGNOSIS — F332 Major depressive disorder, recurrent severe without psychotic features: Secondary | ICD-10-CM | POA: Diagnosis not present

## 2017-04-28 MED FILL — CITALOPRAM HBR 40 MG TABLET: 40 | 30 days supply | Qty: 30 | Fill #0

## 2017-04-28 MED FILL — GABAPENTIN 300 MG CAPSULE: 300 | 30 days supply | Qty: 90 | Fill #0

## 2017-05-19 ENCOUNTER — Ambulatory Visit (INDEPENDENT_AMBULATORY_CARE_PROVIDER_SITE_OTHER): Payer: No Typology Code available for payment source | Admitting: Family Medicine

## 2017-05-19 ENCOUNTER — Encounter: Payer: Self-pay | Admitting: Family Medicine

## 2017-05-19 VITALS — BP 102/70 | HR 83 | Temp 98.3°F | Ht 62.5 in | Wt 145.3 lb

## 2017-05-19 DIAGNOSIS — F419 Anxiety disorder, unspecified: Secondary | ICD-10-CM

## 2017-05-19 DIAGNOSIS — F329 Major depressive disorder, single episode, unspecified: Secondary | ICD-10-CM

## 2017-05-19 DIAGNOSIS — N644 Mastodynia: Secondary | ICD-10-CM | POA: Diagnosis not present

## 2017-05-19 DIAGNOSIS — F32A Depression, unspecified: Secondary | ICD-10-CM | POA: Insufficient documentation

## 2017-05-19 NOTE — Progress Notes (Signed)
HPI:  Acute visit for left breast pain.  Started about a month ago.  She has had constant soreness in the left breast.  She denies any skin changes, nipple discharge, irregular periods, new activities, weight loss, fevers or malaise. She reports she does roughhouse with her boys, so she could have strained something. FDLMP: 05/06/17  ROS: See pertinent positives and negatives per HPI.  Past Medical History:  Diagnosis Date  . Allergy   . Anxiety and depression    per patient diagnosed by psychiatrist  . Appendicitis 07/20/2012  . Blood in stool   . Chicken pox   . Depression    postpartum with one child  . GERD 01/31/2007   Qualifier: Diagnosis of  By: [name] RMA, [name]    . Hyperlipidemia    postpartum  . PONV (postoperative nausea and vomiting)   . RAYNAUD'S DISEASE 01/31/2007   Qualifier: Diagnosis of  By: Samara SnideBrand RMA, [name]      Past Surgical History:  Procedure Laterality Date  . CESAREAN SECTION    . LAPAROSCOPIC APPENDECTOMY Right 07/20/2012   Procedure: APPENDECTOMY LAPAROSCOPIC;  Surgeon: [name], MD;  Location: [facility];  Service: General;  Laterality: Right;  . WISDOM TOOTH EXTRACTION      Family History  Problem Relation Age of Onset  . Early menopause Mother   . Cancer Maternal Grandmother        melanoma  . Stroke Maternal Grandmother   . Arthritis Maternal Grandmother   . Hyperlipidemia Maternal Grandmother   . Hypertension Maternal Grandmother   . Cancer Maternal Grandfather        bladder  . Lung cancer Unknown        all but one of grandmother's siblings     Social History   Socioeconomic History  . Marital status: Married    Spouse name: None  . Number of children: None  . Years of education: None  . Highest education level: None  Social Needs  . Financial resource strain: None  . Food insecurity - worry: None  . Food insecurity - inability: None  . Transportation needs - medical: None  . Transportation needs - non-medical: None   Occupational History  . None  Tobacco Use  . Smoking status: Never Smoker  . Smokeless tobacco: Never Used  Substance and Sexual Activity  . Alcohol use: No  . Drug use: No  . Sexual activity: Yes    Birth control/protection: None    Comment: husband had vasectomy  Other Topics Concern  . 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  citalopram (CELEXA) 10 MG tablet, Take 10 mg by mouth daily., Disp: , Rfl: 1 .  gabapentin (NEURONTIN) 300 MG capsule, TAKE 1 CAPSULE BY MOUTH 3 TIMES PER DAY, Disp: , Rfl: 1  EXAM:  Vitals:   05/19/17 1126  BP: 102/70  Pulse: 83  Temp: 98.3 F (36.8 C)    Body mass index is 26.15 kg/m.  GENERAL: vitals reviewed and listed above, alert, oriented, appears well hydrated and in no acute distress  HEENT: atraumatic, conjunttiva clear, no obvious abnormalities on inspection  of external nose and ears  NECK: no obvious masses on inspection  LUNGS: clear to auscultation bilaterally, no wheezes, rales or rhonchi, good air movement  CV: HRRR, no peripheral edema  BREAST: normal appearance - no skin lesions or discharge noted on inspection of both breasts, on palpation of both breast and axillary region no suspicious lesions appreciated today, but she does have some mobile density to the breast in the area of concern around 11:00 to 1:00 in the upper breast on the left -no discrete fixed mass  PSYCH: pleasant and cooperative, no obvious depression or anxiety  ASSESSMENT AND PLAN:  Discussed the following assessment and plan:  Breast pain, left - Plan: US BREAST LTD UNI LEFT INC AXILLA, MM Digital Diagnostic Unilat L  -Gust various etiologies, this may be fibrocystic changes versus strain,  versus other -We will send a referral for evaluation at the breast center -Patient advised to return or notify a doctor immediately if symptoms worsen or persist or new concerns arise.  We also advised her to contact us if she has not heard about this referral within the next 1 week.  Patient Instructions  -We placed a referral for you as discussed to the breast center for evaluation. It usually takes about 1-2 weeks to process and schedule this referral. If you have not heard from [place] regarding this appointment in 2 weeks please contact our office.  -in the interim can try heat and aleve if needed.     Kriste Basque R., DO

## 2017-05-19 NOTE — Patient Instructions (Signed)
-  We placed a referral for you as discussed to the breast center for evaluation. It usually takes about 1-2 weeks to process and schedule this referral. If you have not heard from us regarding this appointment in 2 weeks please contact our office.  -in the interim can try heat and aleve if needed.

## 2017-05-21 ENCOUNTER — Telehealth: Payer: Self-pay | Admitting: Family Medicine

## 2017-05-21 NOTE — Telephone Encounter (Signed)
Copied from CRM [phone]. Topic: Referral - Request >> May 21, 2017  3:02 PM Gerrianne ScalePayne, Angela L wrote: Reason for CRM: patient states that she had called her insurance for her ultra sound referral and was approved so the [facility] [place] would like for you to fax over her referral so they can schedule her appointment for mammogram

## 2017-05-22 ENCOUNTER — Encounter: Payer: Self-pay | Admitting: Family Medicine

## 2017-05-22 NOTE — Telephone Encounter (Signed)
Order sheet completed with orders from 1/7 and faxed to [phone].

## 2017-05-26 ENCOUNTER — Ambulatory Visit
Admission: RE | Admit: 2017-05-26 | Discharge: 2017-05-26 | Disposition: A | Discharge status: Home / Self Care | Payer: No Typology Code available for payment source | Source: Ambulatory Visit | Attending: Family Medicine | Admitting: Family Medicine

## 2017-05-26 ENCOUNTER — Other Ambulatory Visit: Payer: Self-pay | Admitting: Family Medicine

## 2017-05-26 DIAGNOSIS — N644 Mastodynia: Secondary | ICD-10-CM

## 2017-05-26 DIAGNOSIS — N6489 Other specified disorders of breast: Secondary | ICD-10-CM

## 2017-05-28 DIAGNOSIS — F332 Major depressive disorder, recurrent severe without psychotic features: Secondary | ICD-10-CM | POA: Diagnosis not present

## 2017-05-28 DIAGNOSIS — F411 Generalized anxiety disorder: Secondary | ICD-10-CM | POA: Diagnosis not present

## 2017-05-28 MED FILL — CITALOPRAM HBR 40 MG TABLET: 40 | 30 days supply | Qty: 30 | Fill #0

## 2017-05-28 MED FILL — GABAPENTIN 300 MG CAPSULE: 300 | 30 days supply | Qty: 90 | Fill #0

## 2017-08-01 MED FILL — GABAPENTIN 300 MG CAPSULE: 300 | 30 days supply | Qty: 90 | Fill #1

## 2017-08-01 MED FILL — CITALOPRAM HBR 40 MG TABLET: 40 | 30 days supply | Qty: 30 | Fill #1

## 2017-10-10 ENCOUNTER — Telehealth: Payer: Self-pay | Admitting: Family Medicine

## 2017-10-10 NOTE — Telephone Encounter (Signed)
I left a message for the pt to return my call. 

## 2017-10-10 NOTE — Telephone Encounter (Signed)
Copied from CRM [phone]#[id number]. Topic: Quick Communication - Rx Refill/Question >> Oct 10, 2017  9:12 AM Cipriano BunkerLambe, Annette S wrote: Medication:  citalopram (CELEXA) 10 MG tablet gabapentin (NEURONTIN) 300 MG capsule  Pt. Psychiatrist does not take insurance and has appt. 6/3, but needs to see if dr. Evaristo Buryan refill until gets new dr.  Has the patient contacted their pharmacy? No. (Agent: If no, request that the patient contact the pharmacy for the refill.) (Agent: If yes, when and what did the pharmacy advise?)  Preferred Pharmacy (with phone number or street name):   [facility] [place] - [address] [place] [address] Phone: [phone] Fax: [phone]    Agent: Please be advised that RX refills may take up to 3 business days. We ask that you follow-up with your pharmacy.

## 2017-10-10 NOTE — Telephone Encounter (Signed)
Pt requesting refills of Celexa and Neurontin, previously filled by historical provider. Pt requesting medications to be filled by PCP due to psychiatrist not taking insurance.    LOV: 05/19/17  Next OV: 10/13/17 PCP: Dr. Selena Batten

## 2017-10-10 NOTE — Telephone Encounter (Signed)
Copied from CRM [id number]. Topic: Referral - Request >> Oct 10, 2017  9:10 AM Cipriano BunkerLambe, Annette S wrote: Reason for CRM:   pt. Is asking for new referral for Psychiatrist, her present one does not take her new insurance.

## 2017-10-12 NOTE — Telephone Encounter (Signed)
Ok to send 30 days for now. Thanks.

## 2017-10-12 NOTE — Progress Notes (Signed)
HPI:  Using dictation device. Unfortunately this device frequently misinterprets words/phrases.  Here for CPE:  -Concerns and/or follow up today:  Here psychiatrist retired and she would like me to take over if possible. Long hx of mild depression and Anxiety. No hx hospitalization or SI. -meds: celexa 48m -neurontin 3077mtid -CBT in the past -reports stable for a long time  Husband concerned about her weight. She is active with kids and housework. Diet not good.  -Taking folic acid, vitamin D or calcium: no -Diabetes and Dyslipidemia Screening: labs today -Vaccines: see vaccine section EPIC -pap history: 09/2016 with Dr. HoPhilis Piquegyn - but wants to transfer care to here -FDLMP: see nursing notes -sexual activity: yes, female partner, no new partners -wants STI testing (Hep C if born 1955-65 no -FH breast, colon or ovarian ca: see FH Last mammogram: 05/2017 - has repeat this July for breast lump Last colon cancer screening: n/a Breast Ca Risk Assessment: see family history and pt history DEXA (>/= 65): n/a  -Alcohol, Tobacco, drug use: see social history  Review of Systems - no fevers, unintentional weight loss, vision loss, hearing loss, chest pain, sob, hemoptysis, melena, hematochezia, hematuria, genital discharge, changing or concerning skin lesions, bleeding, bruising, loc, thoughts of self harm or SI  Past Medical History:  Diagnosis Date  . Allergy   . Anxiety and depression    per patient diagnosed by psychiatrist  . Appendicitis 07/20/2012  . Blood in stool   . Chicken pox   . Depression    postpartum with one child  . GERD 01/31/2007   Qualifier: Diagnosis of  By: BrMarca AnconaMA, [name]    . Hyperlipidemia    postpartum  . PONV (postoperative nausea and vomiting)   . RAYNAUD'S DISEASE 01/31/2007   Qualifier: Diagnosis of  By: BrLarose Kells    Past Surgical History:  Procedure Laterality Date  . CESAREAN SECTION    . LAPAROSCOPIC APPENDECTOMY Right  07/20/2012   Procedure: APPENDECTOMY LAPAROSCOPIC;  Surgeon: ToEarnstine RegalMD;  Location: [facility];  Service: General;  Laterality: Right;  . WISDOM TOOTH EXTRACTION      Family History  Problem Relation Age of Onset  . Early menopause Mother   . Cancer Maternal Grandmother        melanoma  . Stroke Maternal Grandmother   . Arthritis Maternal Grandmother   . Hyperlipidemia Maternal Grandmother   . Hypertension Maternal Grandmother   . Breast cancer Maternal Grandmother   . Cancer Maternal Grandfather        bladder  . Lung cancer Unknown        all but one of grandmother's siblings     Social History   Socioeconomic History  . Marital status: Married    Spouse name: Not on file  . Number of children: Not on file  . Years of education: Not on file  . Highest education level: Not on file  Occupational History  . Not on file  Social Needs  . Financial resource strain: Not on file  . Food insecurity:    Worry: Not on file    Inability: Not on file  . Transportation needs:    Medical: Not on file    Non-medical: Not on file  Tobacco Use  . Smoking status: Never Smoker  . Smokeless tobacco: Never Used  Substance and Sexual Activity  . Alcohol use: No  . Drug use: No  . Sexual activity: Yes    Birth control/protection:  None    Comment: husband had vasectomy  Lifestyle  . Physical activity:    Days per week: Not on file    Minutes per session: Not on file  . Stress: Not on file  Relationships  . Social connections:    Talks on phone: Not on file    Gets together: Not on file    Attends religious service: Not on file    Active member of club or organization: Not on file    Attends meetings of clubs or organizations: Not on file    Relationship status: Not on file  Other Topics Concern  . 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  citalopram (CELEXA) 40 MG tablet, Take 1 tablet (40 mg total) by mouth daily., Disp: 90 tablet, Rfl: 1 .  gabapentin (NEURONTIN) 300 MG capsule, TAKE 1 CAPSULE BY MOUTH 3 TIMES PER DAY, Disp: 90 capsule, Rfl: 3  EXAM:  Vitals:   10/13/17 0822  BP: 102/80  Pulse: 76  Temp: 98 F (36.7 C)    GENERAL: vitals reviewed and listed below, alert, oriented, appears well hydrated and in no acute distress  HEENT: head atraumatic, PERRLA, normal appearance of eyes, ears, nose and mouth. moist mucus membranes.  NECK: supple, no masses or lymphadenopathy  LUNGS: clear to auscultation bilaterally, no rales, rhonchi or wheeze  CV: HRRR, no peripheral edema or cyanosis, normal pedal pulses  ABDOMEN: bowel sounds normal, soft, non tender to palpation, no masses, no rebound or guarding  BREAST: normal appearance - no skin lesions or discharge noted on inspection of both breasts, on palpation of both breast and axillary region no suspicious lesions appreciated today  XB:[id number]  SKIN: no rash or abnormal lesions, 3 scattered hyperkeratotic scaly plaques on the R arm approx 1-1.5 cm in diameter  MS: normal gait, moves all extremities normally  NEURO: normal gait, speech and thought processing grossly intact, muscle tone grossly intact throughout  PSYCH: normal affect, pleasant and cooperative  ASSESSMENT AND PLAN:  Discussed the following assessment and plan:  PREVENTIVE EXAM: -Discussed and advised all [place] preventive services health task force level A and B recommendations for age, sex and risks. -Advised at least 150 minutes of exercise per week and a healthy diet with avoidance of (less then 1 serving per week) processed foods, white starches, red meat, fast foods and sweets and consisting of: * 5-9 servings of fresh fruits and vegetables (not corn or  potatoes) *nuts and seeds, beans *olives and olive oil *lean meats such as fish and white chicken  *whole grains -labs, studies and vaccines per orders this encounter  2. Anxiety and depression -assuming care -discussed adding CBT and consideration of tapering meds when doing well for some time -see PHQ9  3. Skin lesion -advised trial topical azole and hct and derm eval if does not resolve in 3 weeks -brochure for derm provided and she agrees to call  Patient advised to return to clinic immediately if symptoms worsen or persist or new concerns.  Patient Instructions  BEFORE YOU LEAVE: -labs -follow up in 3 months  Get  mammogram as planned.  We have ordered labs or studies at this visit. It can take up to 1-2 weeks for results and processing. IF results require follow up or explanation, we will call you with instructions. Clinically stable results will be released to your [facility]. If you have not heard from [place] or cannot find your results in [facility] in 2 weeks please contact our office at [phone].  If you are not yet signed up for [facility], please consider signing up.    Preventive Care 18-39 Years, Female Preventive care refers to lifestyle choices and visits with your health care provider that can promote health and wellness. What does preventive care include?  A yearly physical exam. This is also called an annual well check.  Dental exams once or twice a year.  Routine eye exams. Ask your health care provider how often you should have your eyes checked.  Personal lifestyle choices, including: ? Daily care of your teeth and gums. ? Regular physical activity. ? Eating a healthy diet. ? Avoiding tobacco and drug use. ? Limiting alcohol use. ? Practicing safe sex. ? Taking vitamin and mineral supplements as recommended by your health care provider. What happens during an annual well check? The services and screenings done by your health care provider during your  annual well check will depend on your age, overall health, lifestyle risk factors, and family history of disease. Counseling Your health care provider may ask you questions about your:  Alcohol use.  Tobacco use.  Drug use.  Emotional well-being.  Home and relationship well-being.  Sexual activity.  Eating habits.  Work and work Statistician.  Method of birth control.  Menstrual cycle.  Pregnancy history.  Screening You may have the following tests or measurements:  Height, weight, and BMI.  Diabetes screening. This is done by checking your blood sugar (glucose) after you have not eaten for a while (fasting).  Blood pressure.  Lipid and cholesterol levels. These may be checked every 5 years starting at age 78.  Skin check.  Hepatitis C blood test.  Hepatitis B blood test.  Sexually transmitted disease (STD) testing.  BRCA-related cancer screening. This may be done if you have a family history of breast, ovarian, tubal, or peritoneal cancers.  Pelvic exam and Pap test. This may be done every 3 years starting at age 26. Starting at age 69, this may be done every 5 years if you have a Pap test in combination with an HPV test.  Discuss your test results, treatment options, and if necessary, the need for more tests with your health care provider. Vaccines Your health care provider may recommend certain vaccines, such as:  Influenza vaccine. This is recommended every year.  Tetanus, diphtheria, and acellular pertussis (Tdap, Td) vaccine. You may need a Td booster every 10 years.  Varicella vaccine. You may need this if you have not been vaccinated.  HPV vaccine. If you are 73 or younger, you may need three doses over 6 months.  Measles, mumps, and rubella (MMR) vaccine. You may need at least one dose of MMR. You may also need a second dose.  Pneumococcal 13-valent conjugate (PCV13) vaccine. You may need this if you have certain conditions and were not  previously vaccinated.  Pneumococcal polysaccharide (PPSV23) vaccine. You may need one or two doses if you smoke cigarettes or if you have certain conditions.  Meningococcal vaccine. One dose is recommended if you are age 63-21 years and a first-year college student living in a residence  hall, or if you have one of several medical conditions. You may also need additional booster doses.  Hepatitis A vaccine. You may need this if you have certain conditions or if you travel or work in places where you may be exposed to hepatitis A.  Hepatitis B vaccine. You may need this if you have certain conditions or if you travel or work in places where you may be exposed to hepatitis B.  Haemophilus influenzae type b (Hib) vaccine. You may need this if you have certain risk factors.  Talk to your health care provider about which screenings and vaccines you need and how often you need them. This information is not intended to replace advice given to you by your health care provider. Make sure you discuss any questions you have with your health care provider. Document Released: 06/25/2001 Document Revised: 01/17/2016 Document Reviewed: 02/28/2015 Elsevier Interactive Patient Education  2018 [employer].          No follow-ups on file.  Lucretia Kern, DO

## 2017-10-13 ENCOUNTER — Ambulatory Visit (INDEPENDENT_AMBULATORY_CARE_PROVIDER_SITE_OTHER): Payer: No Typology Code available for payment source | Admitting: Family Medicine

## 2017-10-13 ENCOUNTER — Encounter: Payer: Self-pay | Admitting: Family Medicine

## 2017-10-13 VITALS — BP 102/80 | HR 76 | Temp 98.0°F | Ht 62.75 in | Wt 146.2 lb

## 2017-10-13 DIAGNOSIS — L989 Disorder of the skin and subcutaneous tissue, unspecified: Secondary | ICD-10-CM | POA: Diagnosis not present

## 2017-10-13 DIAGNOSIS — Z0000 Encounter for general adult medical examination without abnormal findings: Secondary | ICD-10-CM | POA: Diagnosis not present

## 2017-10-13 DIAGNOSIS — F329 Major depressive disorder, single episode, unspecified: Secondary | ICD-10-CM

## 2017-10-13 DIAGNOSIS — F419 Anxiety disorder, unspecified: Secondary | ICD-10-CM

## 2017-10-13 DIAGNOSIS — F32A Depression, unspecified: Secondary | ICD-10-CM

## 2017-10-13 LAB — LIPID PANEL
Cholesterol: 217 mg/dL — ABNORMAL HIGH (ref 0–200)
HDL: 42.2 mg/dL (ref >39.00)
LDL Cholesterol: 137 mg/dL — ABNORMAL HIGH (ref 0–99)
NonHDL: 174.91
TRIGLYCERIDES: 189 mg/dL — AB (ref 0.0–149.0)
Total CHOL/HDL Ratio: 5
VLDL: 37.8 mg/dL (ref 0.0–40.0)

## 2017-10-13 LAB — HEMOGLOBIN A1C: HEMOGLOBIN A1C: 5.4 % (ref 4.6–6.5)

## 2017-10-13 MED ORDER — CITALOPRAM HYDROBROMIDE 40 MG PO TABS: 40.0000 mg | ORAL_TABLET | Freq: Every day | ORAL | 1 refills | Status: DC

## 2017-10-13 MED ORDER — GABAPENTIN 300 MG PO CAPS: ORAL_CAPSULE | ORAL | 3 refills | Status: DC

## 2017-10-13 MED FILL — GABAPENTIN 300 MG CAPSULE: 300 | 30 days supply | Qty: 90 | Fill #0

## 2017-10-13 MED FILL — CITALOPRAM HBR 40 MG TABLET: 40 | 90 days supply | Qty: 90 | Fill #0

## 2017-10-13 NOTE — Telephone Encounter (Signed)
Patient was given info and numbers for Psychiatric Care resources and [facility] at her appt today.

## 2017-10-13 NOTE — Patient Instructions (Signed)
BEFORE YOU LEAVE: -labs -follow up in 3 months  Get mammogram as planned.  We have ordered labs or studies at this visit. It can take up to 1-2 weeks for results and processing. IF results require follow up or explanation, we will call you with instructions. Clinically stable results will be released to your [facility]. If you have not heard from [place] or cannot find your results in [facility] in 2 weeks please contact our office at [phone].  If you are not yet signed up for [facility], please consider signing up.    Preventive Care 18-39 Years, Female Preventive care refers to lifestyle choices and visits with your health care provider that can promote health and wellness. What does preventive care include?  A yearly physical exam. This is also called an annual well check.  Dental exams once or twice a year.  Routine eye exams. Ask your health care provider how often you should have your eyes checked.  Personal lifestyle choices, including: ? Daily care of your teeth and gums. ? Regular physical activity. ? Eating a healthy diet. ? Avoiding tobacco and drug use. ? Limiting alcohol use. ? Practicing safe sex. ? Taking vitamin and mineral supplements as recommended by your health care provider. What happens during an annual well check? The services and screenings done by your health care provider during your annual well check will depend on your age, overall health, lifestyle risk factors, and family history of disease. Counseling Your health care provider may ask you questions about your:  Alcohol use.  Tobacco use.  Drug use.  Emotional well-being.  Home and relationship well-being.  Sexual activity.  Eating habits.  Work and work Statistician.  Method of birth control.  Menstrual cycle.  Pregnancy history.  Screening You may have the following tests or measurements:  Height, weight, and BMI.  Diabetes screening. This is done by checking your blood sugar  (glucose) after you have not eaten for a while (fasting).  Blood pressure.  Lipid and cholesterol levels. These may be checked every 5 years starting at age 28.  Skin check.  Hepatitis C blood test.  Hepatitis B blood test.  Sexually transmitted disease (STD) testing.  BRCA-related cancer screening. This may be done if you have a family history of breast, ovarian, tubal, or peritoneal cancers.  Pelvic exam and Pap test. This may be done every 3 years starting at age 38. Starting at age 90, this may be done every 5 years if you have a Pap test in combination with an HPV test.  Discuss your test results, treatment options, and if necessary, the need for more tests with your health care provider. Vaccines Your health care provider may recommend certain vaccines, such as:  Influenza vaccine. This is recommended every year.  Tetanus, diphtheria, and acellular pertussis (Tdap, Td) vaccine. You may need a Td booster every 10 years.  Varicella vaccine. You may need this if you have not been vaccinated.  HPV vaccine. If you are 64 or younger, you may need three doses over 6 months.  Measles, mumps, and rubella (MMR) vaccine. You may need at least one dose of MMR. You may also need a second dose.  Pneumococcal 13-valent conjugate (PCV13) vaccine. You may need this if you have certain conditions and were not previously vaccinated.  Pneumococcal polysaccharide (PPSV23) vaccine. You may need one or two doses if you smoke cigarettes or if you have certain conditions.  Meningococcal vaccine. One dose is recommended if you are age 48-21  years and a Market researcher living in a residence hall, or if you have one of several medical conditions. You may also need additional booster doses.  Hepatitis A vaccine. You may need this if you have certain conditions or if you travel or work in places where you may be exposed to hepatitis A.  Hepatitis B vaccine. You may need this if you have  certain conditions or if you travel or work in places where you may be exposed to hepatitis B.  Haemophilus influenzae type b (Hib) vaccine. You may need this if you have certain risk factors.  Talk to your health care provider about which screenings and vaccines you need and how often you need them. This information is not intended to replace advice given to you by your health care provider. Make sure you discuss any questions you have with your health care provider. Document Released: 06/25/2001 Document Revised: 01/17/2016 Document Reviewed: 02/28/2015 Elsevier Interactive Patient Education  [employer].

## 2017-10-13 NOTE — Telephone Encounter (Signed)
Refills were sent by Dr Selena BattenKim during pts office visit today.

## 2017-11-24 ENCOUNTER — Ambulatory Visit
Admission: RE | Admit: 2017-11-24 | Discharge: 2017-11-24 | Disposition: A | Discharge status: Home / Self Care | Payer: No Typology Code available for payment source | Source: Ambulatory Visit | Attending: Family Medicine | Admitting: Family Medicine

## 2017-11-24 ENCOUNTER — Other Ambulatory Visit: Payer: Self-pay | Admitting: Family Medicine

## 2017-11-24 DIAGNOSIS — N6489 Other specified disorders of breast: Secondary | ICD-10-CM

## 2017-12-11 MED FILL — GABAPENTIN 300 MG CAPSULE: 300 | 30 days supply | Qty: 90 | Fill #1

## 2018-01-12 NOTE — Progress Notes (Signed)
HPI:  Using dictation device. Unfortunately this device frequently misinterprets words/phrases. Due for ? Flu shot  Hx Depression and anxiety: -used to see psychiatrist -meds: celexa 40mg , neurontin 300 mg tid for anxiety from psychiatrist -CBT in the past -denies hx severe symptoms or hospitalization in the past -today reports doing well, no panic, severe symptoms, SI - see PHQ9 -wants to continue medication for now  ROS: See pertinent positives and negatives per HPI.  Past Medical History:  Diagnosis Date  . Allergy   . Anxiety and depression    per patient diagnosed by psychiatrist  . Appendicitis 07/20/2012  . Blood in stool   . Chicken pox   . Depression    postpartum with one child  . GERD 01/31/2007   Qualifier: Diagnosis of  By: Charlsie Quest RMA, [name]    . Hyperlipidemia    postpartum  . PONV (postoperative nausea and vomiting)   . RAYNAUD'S DISEASE 01/31/2007   Qualifier: Diagnosis of  By: Samara Snide      Past Surgical History:  Procedure Laterality Date  . CESAREAN SECTION    . LAPAROSCOPIC APPENDECTOMY Right 07/20/2012   Procedure: APPENDECTOMY LAPAROSCOPIC;  Surgeon: Velora Heckler, MD;  Location: [facility];  Service: General;  Laterality: Right;  . WISDOM TOOTH EXTRACTION      Family History  Problem Relation Age of Onset  . Early menopause Mother   . Cancer Maternal Grandmother        melanoma  . Stroke Maternal Grandmother   . Arthritis Maternal Grandmother   . Hyperlipidemia Maternal Grandmother   . Hypertension Maternal Grandmother   . Breast cancer Maternal Grandmother   . Cancer Maternal Grandfather        bladder  . Lung cancer Unknown        all but one of grandmother's siblings     SOCIAL HX: see hpi   Current Outpatient Medications:  .  citalopram (CELEXA) 40 MG tablet, Take 1 tablet (40 mg total) by mouth daily., Disp: 90 tablet, Rfl: 1 .  gabapentin (NEURONTIN) 300 MG capsule, TAKE 1 CAPSULE BY MOUTH 3 TIMES PER DAY, Disp: 270 capsule,  Rfl: 1  EXAM:  Vitals:   01/13/18 0913  BP: 98/78  Pulse: 67  Temp: 97.6 F (36.4 C)    Body mass index is 27.6 kg/m.  GENERAL: vitals reviewed and listed above, alert, oriented, appears well hydrated and in no acute distress  HEENT: atraumatic, conjunttiva clear, no obvious abnormalities on inspection of external nose and ears  NECK: no obvious masses on inspection  LUNGS: clear to auscultation bilaterally, no wheezes, rales or rhonchi, good air movement  CV: HRRR, no peripheral edema  MS: moves all extremities without noticeable abnormality  PSYCH: pleasant and cooperative, no obvious depression or anxiety  ASSESSMENT AND PLAN:  Discussed the following assessment and plan:  Recurrent major depressive disorder, in full remission (HCC)  GAD (generalized anxiety disorder)  -stable -refills provided -follow up 6 month and as needed, consider taper of Neurontin at some point if able, she preferred not to do this today -discussed flu shot, she plans to return for this in October -Patient advised to return or notify a doctor immediately if symptoms worsen or persist or new concerns arise.  Patient Instructions  BEFORE YOU LEAVE: -follow up:  1) nurse visit in October for flu shot 2) follow up 6 months  Continue current medications.  Follow up sooner as needed.  WE NOW OFFER   [facility]  FAST TRACK!!!  SAME DAY Appointments for ACUTE CARE  Such as: Sprains, Injuries, cuts, abrasions, rashes, muscle pain, joint pain, back pain Colds, flu, sore throats, headache, allergies, cough, fever  Ear pain, sinus and eye infections Abdominal pain, nausea, vomiting, diarrhea, upset stomach Animal/insect bites  3 Easy Ways to Schedule: Walk-In Scheduling Call in scheduling Mychart Sign-up: [email or web address]            Terressa Koyanagi, DO

## 2018-01-13 ENCOUNTER — Ambulatory Visit (INDEPENDENT_AMBULATORY_CARE_PROVIDER_SITE_OTHER): Payer: No Typology Code available for payment source | Admitting: Family Medicine

## 2018-01-13 ENCOUNTER — Encounter: Payer: Self-pay | Admitting: Family Medicine

## 2018-01-13 VITALS — BP 98/78 | HR 67 | Temp 97.6°F | Ht 62.75 in | Wt 154.6 lb

## 2018-01-13 DIAGNOSIS — F3342 Major depressive disorder, recurrent, in full remission: Secondary | ICD-10-CM | POA: Diagnosis not present

## 2018-01-13 DIAGNOSIS — F411 Generalized anxiety disorder: Secondary | ICD-10-CM | POA: Diagnosis not present

## 2018-01-13 MED ORDER — GABAPENTIN 300 MG PO CAPS: ORAL_CAPSULE | ORAL | 1 refills | Status: DC

## 2018-01-13 NOTE — Patient Instructions (Signed)
BEFORE YOU LEAVE: -follow up:  1) nurse visit in October for flu shot 2) follow up 6 months  Continue current medications.  Follow up sooner as needed.  WE NOW OFFER   [facility] FAST TRACK!!!  SAME DAY Appointments for ACUTE CARE  Such as: Sprains, Injuries, cuts, abrasions, rashes, muscle pain, joint pain, back pain Colds, flu, sore throats, headache, allergies, cough, fever  Ear pain, sinus and eye infections Abdominal pain, nausea, vomiting, diarrhea, upset stomach Animal/insect bites  3 Easy Ways to Schedule: Walk-In Scheduling Call in scheduling Mychart Sign-up: [email or web address]

## 2018-02-04 MED FILL — GABAPENTIN 300 MG CAPSULE: 300 | 90 days supply | Qty: 270 | Fill #0

## 2018-02-04 MED FILL — CITALOPRAM HBR 40 MG TABLET: 40 | 90 days supply | Qty: 90 | Fill #1

## 2018-02-11 ENCOUNTER — Ambulatory Visit: Payer: No Typology Code available for payment source

## 2018-02-13 ENCOUNTER — Ambulatory Visit (INDEPENDENT_AMBULATORY_CARE_PROVIDER_SITE_OTHER): Payer: No Typology Code available for payment source

## 2018-02-13 DIAGNOSIS — Z23 Encounter for immunization: Secondary | ICD-10-CM

## 2018-02-13 NOTE — Progress Notes (Signed)
Per orders of Dr. Selena Batten, injection of Fluarix given by Solon Augusta. Patient tolerated injection well.

## 2018-05-29 ENCOUNTER — Ambulatory Visit
Admission: RE | Admit: 2018-05-29 | Discharge: 2018-05-29 | Disposition: A | Discharge status: Home / Self Care | Payer: No Typology Code available for payment source | Source: Ambulatory Visit | Attending: Family Medicine | Admitting: Family Medicine

## 2018-05-29 DIAGNOSIS — N6489 Other specified disorders of breast: Secondary | ICD-10-CM

## 2018-06-23 ENCOUNTER — Other Ambulatory Visit: Payer: Self-pay | Admitting: Family Medicine

## 2018-06-23 MED FILL — GABAPENTIN 300 MG CAPSULE: 300 | 90 days supply | Qty: 270 | Fill #1

## 2018-06-23 MED FILL — CITALOPRAM HBR 40 MG TABLET: 40 | 90 days supply | Qty: 90 | Fill #0

## 2018-07-14 ENCOUNTER — Ambulatory Visit: Payer: No Typology Code available for payment source | Admitting: Family Medicine

## 2018-07-15 NOTE — Progress Notes (Signed)
HPI:  Using dictation device. Unfortunately this device frequently misinterprets words/phrases.  Lindsay Wong is a pleasant 38 y.o. here for follow up. See history below. She has a number of concerns/requests today. She has a long hx of depression with several significant bouts where she reports she was "crying all day", but between these spells just feels tired and unmotivated chronically.  Denies SI or depression currently. She has flares of cognitive fog where she forgets what she is doing frequently. She would like an evaluation with neurology. She currently has weaned off of her neurolontin and iss weaning her celexa as she feels she is not different on or off of these and she feels they have cause weight gain. She struggles with weight, has a dx of raynaud's and has dry skin. Reports had workup with rheum in the past. No fevers or wt loss. She also has L knee problems. Reports broke this knee as a small child. now with medial joint line pain for 3 months. Worse with walking.  Trying to change her diet and walk on a regular basis. Not fasting.  Hx Depression and anxiety: -used to see psychiatrist -meds: celexa 40mg , neurontin 300 mg tid for anxiety from psychiatrist -CBT in the past -denies hx severe symptoms or hospitalization in the past  Overweight (BMI 25-30)/Hyperlipidemia: -lifestyle changes advised  ROS: See pertinent positives and negatives per HPI.  Past Medical History:  Diagnosis Date  . Allergy   . Anxiety and depression    per patient diagnosed by psychiatrist  . Appendicitis 07/20/2012  . Blood in stool   . Chicken pox   . Depression    postpartum with one child  . GERD 01/31/2007   Qualifier: Diagnosis of  By: [name] RMA, [name]    . Hyperlipidemia    postpartum  . PONV (postoperative nausea and vomiting)   . RAYNAUD'S DISEASE 01/31/2007   Qualifier: Diagnosis of  By: Samara SnideBrand RMA, [name]      Past Surgical History:  Procedure Laterality Date  . CESAREAN SECTION     . LAPAROSCOPIC APPENDECTOMY Right 07/20/2012   Procedure: APPENDECTOMY LAPAROSCOPIC;  Surgeon: [name], MD;  Location: [facility];  Service: General;  Laterality: Right;  . WISDOM TOOTH EXTRACTION      Family History  Problem Relation Age of Onset  . Early menopause Mother   . Cancer Maternal Grandmother        melanoma  . Stroke Maternal Grandmother   . Arthritis Maternal Grandmother   . Hyperlipidemia Maternal Grandmother   . Hypertension Maternal Grandmother   . Breast cancer Maternal Grandmother   . Cancer Maternal Grandfather        bladder  . Lung cancer Unknown        all but one of grandmother's siblings     SOCIAL HX: see hpi   Current Outpatient Medications:  .  citalopram (CELEXA) 40 MG tablet, TAKE 1 TABLET (40 MG TOTAL) BY MOUTH DAILY. (Patient taking differently: Take 20 mg by mouth daily. ), Disp: 90 tablet, Rfl: 0  EXAM:  Vitals:   07/16/18 1027  BP: 124/72  Pulse: 89  Temp: 98 F (36.7 C)    Body mass index is 28.66 kg/m.  GENERAL: vitals reviewed and listed above, alert, oriented, appears well hydrated and in no acute distress  HEENT: atraumatic, conjunttiva clear, no obvious abnormalities on inspection of external nose and ears  NECK: no obvious masses on inspection, no LAD  LUNGS: clear to auscultation bilaterally, no  wheezes, rales or rhonchi, good air movement  SKIN: dry  CV: HRRR, no peripheral edema  MS: moves all extremities without noticeable abnormality, gait antalgic, no appreciable swelling or redness of the knee, does have medial jt line TTP, laxiety/instability L knee with drawer testing compared to R, no sig pain with lachman, NV intact disatal  PSYCH: pleasant and cooperative, no obvious depression or anxiety  ASSESSMENT AND PLAN:  Discussed the following assessment and plan:  Anxiety and depression  Raynaud's disease without gangrene  Cognitive deficits - Plan: Ambulatory referral to Neurology, CBC, Comprehensive  metabolic panel, TSH, Vitamin B12, VITAMIN D 25 Hydroxy (Vit-D Deficiency, Fractures)  Weight gain - Plan: TSH  Xerosis of skin - Plan: TSH  Chronic pain of left knee - Plan: Ambulatory referral to Orthopedic Surgery  Instability of left knee joint - Plan: Ambulatory referral to Orthopedic Surgery  Hyperlipidemia, unspecified hyperlipidemia type - Plan: Lipid panel  BMI 28.0-28.9,adult  -we discussed possible serious and likely etiologies, workup and treatment, treatment risks and return precautions for her concerns -advised CBT for the depression (info and brochure to call provided and she agreed to call), especially given she is attempting to wean off medications. Reports she feels no different then in the past and is stable, but PHQ9 mod. Advised prompt eval if any worsening or severe symptoms. -referral to neuro and labs for the cog issues, weight gain, fatigue, dry skin -referral to ortho for knee -advised she call if any worsening symptoms prior to specialist eval or if any delay in getting an appointment with the specialist -lifestyle recs -Patient advised to return or notify a doctor immediately if symptoms worsen or persist or new concerns arise.  Patient Instructions  BEFORE YOU LEAVE: -follow up:  1) fasting lab appt asap 2)f/u in 3 -4 months  Call to schedule an appointment with Maggie Font prior to continuing the wean off celexa.  Seek care promptly if your symptoms worsen or new concerns arise   -We placed a referral for you as discussed to neurology for the cognitive issues and to orthopedics about the knee. It usually takes about 1-2 weeks to process and schedule this referral. If you have not heard from [place] regarding this appointment in 2 weeks please contact our office.   We have ordered labs or studies at this visit. It can take up to 1-2 weeks for results and processing. IF results require follow up or explanation, we will call you with instructions. Clinically  stable results will be released to your [facility]. If you have not heard from [place] or cannot find your results in [facility] in 2 weeks please contact our office at [phone].  If you are not yet signed up for [facility], please consider signing up.    We recommend the following healthy lifestyle for LIFE: 1) Small portions. But, make sure to get regular (at least 3 per day), healthy meals and small healthy snacks if needed.  2) Eat a healthy clean diet.   TRY TO EAT: -at least 5-7 servings of low sugar, colorful, and nutrient rich vegetables per day (not corn, potatoes or bananas.) -berries are the best choice if you wish to eat fruit (only eat small amounts if trying to reduce weight)  -lean meets (fish, white meat of chicken or [place]) -vegan proteins for some meals - beans or tofu, whole grains, nuts and seeds -Replace bad fats with good fats - good fats include: fish, nuts and seeds, canola oil, olive oil -small amounts  of low fat or non fat dairy -small amounts of100 % whole grains - check the lables -drink plenty of water  AVOID: -SUGAR, sweets, anything with added sugar, corn syrup or sweeteners - must read labels as even foods advertised as "healthy" often are loaded with sugar -if you must have a sweetener, small amounts of stevia may be best -sweetened beverages and artificially sweetened beverages -simple starches (rice, bread, potatoes, pasta, chips, etc - small amounts of 100% whole grains are ok) -red meat, pork, butter -fried foods, fast food, processed food, excessive dairy, eggs and coconut.  3)Get at least 150 minutes of sweaty aerobic exercise per week.  4)Reduce stress - consider counseling, meditation and relaxation to balance other aspects of your life.         Terressa Koyanagi, DO

## 2018-07-16 ENCOUNTER — Ambulatory Visit (INDEPENDENT_AMBULATORY_CARE_PROVIDER_SITE_OTHER): Payer: No Typology Code available for payment source | Admitting: Family Medicine

## 2018-07-16 ENCOUNTER — Encounter: Payer: Self-pay | Admitting: Neurology

## 2018-07-16 ENCOUNTER — Encounter: Payer: Self-pay | Admitting: Family Medicine

## 2018-07-16 VITALS — BP 124/72 | HR 89 | Temp 98.0°F | Ht 62.75 in | Wt 160.5 lb

## 2018-07-16 DIAGNOSIS — R4189 Other symptoms and signs involving cognitive functions and awareness: Secondary | ICD-10-CM | POA: Diagnosis not present

## 2018-07-16 DIAGNOSIS — E785 Hyperlipidemia, unspecified: Secondary | ICD-10-CM

## 2018-07-16 DIAGNOSIS — I7300 Raynaud's syndrome without gangrene: Secondary | ICD-10-CM

## 2018-07-16 DIAGNOSIS — F329 Major depressive disorder, single episode, unspecified: Secondary | ICD-10-CM

## 2018-07-16 DIAGNOSIS — Z6828 Body mass index (BMI) 28.0-28.9, adult: Secondary | ICD-10-CM

## 2018-07-16 DIAGNOSIS — L853 Xerosis cutis: Secondary | ICD-10-CM | POA: Diagnosis not present

## 2018-07-16 DIAGNOSIS — F32A Depression, unspecified: Secondary | ICD-10-CM

## 2018-07-16 DIAGNOSIS — R635 Abnormal weight gain: Secondary | ICD-10-CM | POA: Diagnosis not present

## 2018-07-16 DIAGNOSIS — F419 Anxiety disorder, unspecified: Secondary | ICD-10-CM

## 2018-07-16 DIAGNOSIS — M25562 Pain in left knee: Secondary | ICD-10-CM

## 2018-07-16 DIAGNOSIS — M25362 Other instability, left knee: Secondary | ICD-10-CM

## 2018-07-16 DIAGNOSIS — G8929 Other chronic pain: Secondary | ICD-10-CM

## 2018-07-16 NOTE — Patient Instructions (Addendum)
BEFORE YOU LEAVE: -follow up:  1) fasting lab appt asap 2)f/u in 3 -4 months  Call to schedule an appointment with Maggie Font prior to continuing the wean off celexa.  Seek care promptly if your symptoms worsen or new concerns arise   -We placed a referral for you as discussed to neurology for the cognitive issues and to orthopedics about the knee. It usually takes about 1-2 weeks to process and schedule this referral. If you have not heard from [place] regarding this appointment in 2 weeks please contact our office.   We have ordered labs or studies at this visit. It can take up to 1-2 weeks for results and processing. IF results require follow up or explanation, we will call you with instructions. Clinically stable results will be released to your [facility]. If you have not heard from [place] or cannot find your results in [facility] in 2 weeks please contact our office at [phone].  If you are not yet signed up for [facility], please consider signing up.    We recommend the following healthy lifestyle for LIFE: 1) Small portions. But, make sure to get regular (at least 3 per day), healthy meals and small healthy snacks if needed.  2) Eat a healthy clean diet.   TRY TO EAT: -at least 5-7 servings of low sugar, colorful, and nutrient rich vegetables per day (not corn, potatoes or bananas.) -berries are the best choice if you wish to eat fruit (only eat small amounts if trying to reduce weight)  -lean meets (fish, white meat of chicken or [place]) -vegan proteins for some meals - beans or tofu, whole grains, nuts and seeds -Replace bad fats with good fats - good fats include: fish, nuts and seeds, canola oil, olive oil -small amounts of low fat or non fat dairy -small amounts of100 % whole grains - check the lables -drink plenty of water  AVOID: -SUGAR, sweets, anything with added sugar, corn syrup or sweeteners - must read labels as even foods advertised as "healthy" often are loaded with  sugar -if you must have a sweetener, small amounts of stevia may be best -sweetened beverages and artificially sweetened beverages -simple starches (rice, bread, potatoes, pasta, chips, etc - small amounts of 100% whole grains are ok) -red meat, pork, butter -fried foods, fast food, processed food, excessive dairy, eggs and coconut.  3)Get at least 150 minutes of sweaty aerobic exercise per week.  4)Reduce stress - consider counseling, meditation and relaxation to balance other aspects of your life.

## 2018-07-17 ENCOUNTER — Ambulatory Visit (INDEPENDENT_AMBULATORY_CARE_PROVIDER_SITE_OTHER): Payer: No Typology Code available for payment source

## 2018-07-17 ENCOUNTER — Encounter (INDEPENDENT_AMBULATORY_CARE_PROVIDER_SITE_OTHER): Payer: Self-pay | Admitting: Family Medicine

## 2018-07-17 ENCOUNTER — Ambulatory Visit (INDEPENDENT_AMBULATORY_CARE_PROVIDER_SITE_OTHER): Payer: No Typology Code available for payment source | Admitting: Family Medicine

## 2018-07-17 DIAGNOSIS — G8929 Other chronic pain: Secondary | ICD-10-CM | POA: Diagnosis not present

## 2018-07-17 DIAGNOSIS — M5442 Lumbago with sciatica, left side: Secondary | ICD-10-CM

## 2018-07-17 DIAGNOSIS — M25562 Pain in left knee: Secondary | ICD-10-CM | POA: Diagnosis not present

## 2018-07-17 LAB — COMPREHENSIVE METABOLIC PANEL
ALT: 13 U/L (ref 0–35)
AST: 13 U/L (ref 0–37)
Albumin: 4.5 g/dL (ref 3.5–5.2)
Alkaline Phosphatase: 89 U/L (ref 39–117)
BILIRUBIN TOTAL: 0.7 mg/dL (ref 0.2–1.2)
BUN: 10 mg/dL (ref 6–23)
CALCIUM: 9.8 mg/dL (ref 8.4–10.5)
CO2: 29 mEq/L (ref 19–32)
Chloride: 101 mEq/L (ref 96–112)
Creatinine, Ser: 0.92 mg/dL (ref 0.40–1.20)
GFR: 67.63 mL/min (ref >60.00)
Glucose, Bld: 90 mg/dL (ref 70–99)
Potassium: 4.4 mEq/L (ref 3.5–5.1)
Sodium: 137 mEq/L (ref 135–145)
TOTAL PROTEIN: 7.4 g/dL (ref 6.0–8.3)

## 2018-07-17 LAB — CBC
HEMATOCRIT: 40.5 % (ref 36.0–46.0)
Hemoglobin: 14 g/dL (ref 12.0–15.0)
MCHC: 34.5 g/dL (ref 30.0–36.0)
MCV: 85.5 fl (ref 78.0–100.0)
Platelets: 297 10*3/uL (ref 150.0–400.0)
RBC: 4.74 Mil/uL (ref 3.87–5.11)
RDW: 13.2 % (ref 11.5–15.5)
WBC: 6.8 10*3/uL (ref 4.0–10.5)

## 2018-07-17 LAB — TSH: TSH: 2.12 u[IU]/mL (ref 0.35–4.50)

## 2018-07-17 LAB — VITAMIN D 25 HYDROXY (VIT D DEFICIENCY, FRACTURES): VITD: 33.14 ng/mL (ref 30.00–100.00)

## 2018-07-17 LAB — LIPID PANEL
Cholesterol: 246 mg/dL — ABNORMAL HIGH (ref 0–200)
HDL: 50.2 mg/dL (ref >39.00)
LDL Cholesterol: 162 mg/dL — ABNORMAL HIGH (ref 0–99)
NONHDL: 195.9
Total CHOL/HDL Ratio: 5
Triglycerides: 171 mg/dL — ABNORMAL HIGH (ref 0.0–149.0)
VLDL: 34.2 mg/dL (ref 0.0–40.0)

## 2018-07-17 LAB — VITAMIN B12: VITAMIN B 12: 385 pg/mL (ref 211–911)

## 2018-07-17 NOTE — Progress Notes (Signed)
Office Visit Note   Patient: Lindsay Wong           Date of Birth: [birth year]           MRN: [id number] Visit Date: 07/17/2018 Requested by: Lindsay Koyanagi, DO [address] [place], [place] [address] PCP: Lindsay Koyanagi, DO  Subjective: Chief Complaint  Patient presents with  . Left Knee - Pain    Pain since October 2019. NKI. Pain with squatting - medial/lateral. Feels unstable - feels like it may give way.  . Lower Back - Pain    Pain since trying to help lift up the garage door 3 weeks ago. Pain across lower back, but mainly on the left - sharp pains with lying down in bed or with trying to lift anything.    HPI: She is here with low back and left knee pain.  Low back was injured about 3 weeks ago when trying to help her husband lift a broken garage door.  She felt immediate pain in the left lower back.  Very painful for about a week, seems to be getting better.  No radicular symptoms.  Left knee started hurting about 6 or 7 months ago with no injury.  Intermittent pain on the medial aspect, sometimes it feels unstable.  She does not take medications for her pain.  No previous injuries to her knee.  Denies any locking or catching symptoms.               ROS: She is otherwise in good health.  Objective: Vital Signs: There were no vitals taken for this visit.  Physical Exam:  Low back: No visible rash, no swelling or bruising.  She is tender to the left of the L5 spinous process in the paraspinous muscles.  No tenderness at the SI joint and no significant sciatic notch tenderness.  Negative straight leg raise, lower extremity strength and reflexes are normal. Left knee: No effusion, no warmth or erythema.  Full range of motion, negative patella compression and no patellofemoral crepitus.  Ligaments are stable.  Slight tenderness on the medial joint line with no palpable click on McMurray's.  Imaging: X-rays lumbar spine: No sign of fracture.  Possibly some early facet DJD  at the L5-S1 level.  Mild anterior endplate changes at multiple levels.  Disc spaces are well-preserved.  X-rays left knee: Very early spurring at the medial compartment right knee on the AP view and very early spurring in the patellofemoral joint left knee on the lateral view but otherwise unremarkable.  No sign of stress fracture or loose body.   Assessment & Plan: 1.  Improving low back pain with suspected muscular strain -If pain flares up again she will call for physical therapy referral.  2.  Left knee pain, cannot rule out medial meniscus injury -Trial of anti-inflammatories, avoid squatting and kneeling until pain subsides.  If pain persists then possibly MRI scan.     Procedures: No procedures performed  No notes on file     PMFS History: Patient Active Problem List   Diagnosis Date Noted  . Anxiety and depression 05/19/2017  . RAYNAUD'S DISEASE 01/31/2007   Past Medical History:  Diagnosis Date  . Allergy   . Anxiety and depression    per patient diagnosed by psychiatrist  . Appendicitis 07/20/2012  . Blood in stool   . Chicken pox   . Depression    postpartum with one child  . GERD 01/31/2007   Qualifier:  Diagnosis of  By: Tyrone Apple, [name]    . Hyperlipidemia    postpartum  . PONV (postoperative nausea and vomiting)   . RAYNAUD'S DISEASE 01/31/2007   Qualifier: Diagnosis of  By: Samara Snide      Family History  Problem Relation Age of Onset  . Early menopause Mother   . Cancer Maternal Grandmother        melanoma  . Stroke Maternal Grandmother   . Arthritis Maternal Grandmother   . Hyperlipidemia Maternal Grandmother   . Hypertension Maternal Grandmother   . Breast cancer Maternal Grandmother   . Cancer Maternal Grandfather        bladder  . Lung cancer Unknown        all but one of grandmother's siblings     Past Surgical History:  Procedure Laterality Date  . CESAREAN SECTION    . LAPAROSCOPIC APPENDECTOMY Right 07/20/2012   Procedure:  APPENDECTOMY LAPAROSCOPIC;  Surgeon: Velora Heckler, MD;  Location: [facility];  Service: General;  Laterality: Right;  . WISDOM TOOTH EXTRACTION     Social History   Occupational History  . Not on file  Tobacco Use  . Smoking status: Never Smoker  . Smokeless tobacco: Never Used  Substance and Sexual Activity  . Alcohol use: No  . Drug use: No  . Sexual activity: Yes    Birth control/protection: None    Comment: husband had vasectomy

## 2018-07-20 NOTE — Addendum Note (Signed)
Addended by: Johnella Moloney on: 07/20/2018 10:11 AM   Modules accepted: Orders

## 2018-09-30 ENCOUNTER — Ambulatory Visit: Payer: No Typology Code available for payment source | Admitting: Neurology

## 2018-11-23 ENCOUNTER — Other Ambulatory Visit: Payer: No Typology Code available for payment source

## 2018-11-30 ENCOUNTER — Encounter: Payer: Self-pay | Admitting: Family Medicine

## 2018-11-30 ENCOUNTER — Ambulatory Visit (INDEPENDENT_AMBULATORY_CARE_PROVIDER_SITE_OTHER): Payer: No Typology Code available for payment source | Admitting: Family Medicine

## 2018-11-30 ENCOUNTER — Other Ambulatory Visit: Payer: Self-pay

## 2018-11-30 DIAGNOSIS — H66003 Acute suppurative otitis media without spontaneous rupture of ear drum, bilateral: Secondary | ICD-10-CM | POA: Diagnosis not present

## 2018-11-30 MED ORDER — AMOXICILLIN 875 MG PO TABS: 875.0000 mg | ORAL_TABLET | Freq: Two times a day (BID) | ORAL | 0 refills | Status: DC

## 2018-11-30 MED FILL — AMOXICILLIN 875 MG TABS: 875 | 10 days supply | Qty: 20 | Fill #0

## 2018-11-30 NOTE — Progress Notes (Signed)
Patient ID: Lindsay Wong, female   DOB: [birth year], 38 y.o.   MRN: [id number]  This visit type was conducted due to national recommendations for restrictions regarding the COVID-19 pandemic in an effort to limit this patient's exposure and mitigate transmission in our community.   Virtual Visit via Video Note  I connected with Devoria Glassingina [place] on 11/30/18 at 11:45 AM EDT by a video enabled telemedicine application and verified that I am speaking with the correct person using two identifiers.  Location patient: home Location provider:work or home office Persons participating in the virtual visit: patient, provider  I discussed the limitations of evaluation and management by telemedicine and the availability of in person appointments. The patient expressed understanding and agreed to proceed.   HPI: Patient relates bilateral ear fullness for the past several days.  Denies any true ear pain.  She has also noticed some slight decreased hearing.  Mild intermittent vertigo symptoms.  Her husband who is a physician looked at her ears over the weekend and she states there was some increased redness.  She denies any fevers or chills.  She has had some intermittent sinus symptoms as well.  She has allergy to Biaxin.  No penicillin allergy.  Denies any recent cough, fever, dyspnea.   ROS: See pertinent positives and negatives per HPI.  Past Medical History:  Diagnosis Date  . Allergy   . Anxiety and depression    per patient diagnosed by psychiatrist  . Appendicitis 07/20/2012  . Blood in stool   . Chicken pox   . Depression    postpartum with one child  . GERD 01/31/2007   Qualifier: Diagnosis of  By: [name] RMA, [name]    . Hyperlipidemia    postpartum  . PONV (postoperative nausea and vomiting)   . RAYNAUD'S DISEASE 01/31/2007   Qualifier: Diagnosis of  By: Samara SnideBrand RMA, [name]      Past Surgical History:  Procedure Laterality Date  . CESAREAN SECTION    . LAPAROSCOPIC APPENDECTOMY Right  07/20/2012   Procedure: APPENDECTOMY LAPAROSCOPIC;  Surgeon: [name], MD;  Location: [facility];  Service: General;  Laterality: Right;  . WISDOM TOOTH EXTRACTION      Family History  Problem Relation Age of Onset  . Early menopause Mother   . Cancer Maternal Grandmother        melanoma  . Stroke Maternal Grandmother   . Arthritis Maternal Grandmother   . Hyperlipidemia Maternal Grandmother   . Hypertension Maternal Grandmother   . Breast cancer Maternal Grandmother   . Cancer Maternal Grandfather        bladder  . Lung cancer Unknown        all but one of grandmother's siblings     SOCIAL HX: Non-smoker   Current Outpatient Medications:  .  amoxicillin (AMOXIL) 875 MG tablet, Take 1 tablet (875 mg total) by mouth 2 (two) times daily., Disp: 20 tablet, Rfl: 0 .  citalopram (CELEXA) 40 MG tablet, TAKE 1 TABLET (40 MG TOTAL) BY MOUTH DAILY. (Patient taking differently: Take 20 mg by mouth daily. ), Disp: 90 tablet, Rfl: 0  EXAM:  VITALS per patient if applicable:  GENERAL: alert, oriented, appears well and in no acute distress  HEENT: atraumatic, conjunttiva clear, no obvious abnormalities on inspection of external nose and ears  NECK: normal movements of the head and neck  LUNGS: on inspection no signs of respiratory distress, breathing rate appears normal, no obvious gross SOB, gasping or wheezing  CV: no  obvious cyanosis  MS: moves all visible extremities without noticeable abnormality  PSYCH/NEURO: pleasant and cooperative, no obvious depression or anxiety, speech and thought processing grossly intact  ASSESSMENT AND PLAN:  Discussed the following assessment and plan:  Bilateral ear symptoms.  Patient is describing erythematous eardrums with some bulging based on her husband's exam and he is a physician.  -We agreed to go ahead and treat this as a supperative otitis media with amoxicillin 875 mg twice daily for 10 days -Consider office follow-up for exam if not  improving with the above    I discussed the assessment and treatment plan with the patient. The patient was provided an opportunity to ask questions and all were answered. The patient agreed with the plan and demonstrated an understanding of the instructions.   The patient was advised to call back or seek an in-person evaluation if the symptoms worsen or if the condition fails to improve as anticipated.   Carolann Littler, MD

## 2018-12-01 NOTE — Telephone Encounter (Signed)
Disregard last message.  

## 2019-01-01 ENCOUNTER — Other Ambulatory Visit: Payer: Self-pay

## 2019-01-01 ENCOUNTER — Ambulatory Visit (INDEPENDENT_AMBULATORY_CARE_PROVIDER_SITE_OTHER): Payer: No Typology Code available for payment source | Admitting: Family Medicine

## 2019-01-01 ENCOUNTER — Encounter: Payer: Self-pay | Admitting: Family Medicine

## 2019-01-01 DIAGNOSIS — E785 Hyperlipidemia, unspecified: Secondary | ICD-10-CM | POA: Diagnosis not present

## 2019-01-01 DIAGNOSIS — H6983 Other specified disorders of Eustachian tube, bilateral: Secondary | ICD-10-CM | POA: Diagnosis not present

## 2019-01-01 NOTE — Progress Notes (Signed)
Virtual Visit via Video Note  I connected with Lindsay Wong   on 01/01/19 at  4:00 PM EDT by a video enabled telemedicine application and verified that I am speaking with the correct person using two identifiers.  Location patient: home Location provider:work office Persons participating in the virtual visit: patient, provider  I discussed the limitations of evaluation and management by telemedicine and the availability of in person appointments. The patient expressed understanding and agreed to proceed.   Lindsay Wong DOB: 11/09/1980 Encounter date: 01/01/2019  This is a 38 y.o. female who presents to establish care. Chief Complaint  Patient presents with  . Establish Care    History of present illness: No specific concerns today.   Still feels like there is fluid in left ear. Completed antibiotic. Gets some popping. Had dizziness when she had infection; this resolved with antibiotic. Issue with fluid has been well over a year. Occasionally both, but mostly left. Takes claritin, but not regularly. No pain in ears.   Mood has been doing ok overall.   Hyperlipidemia: work in progress. Trying to eat better. Knows she could exercise more.   Has 3 boys at home - 16,13,10. Younger two at home now for schooling.   Always feels tired. Good variety in diet. Just very busy with boys.    Past Medical History:  Diagnosis Date  . Allergy   . Anxiety and depression    per patient diagnosed by psychiatrist  . Appendicitis 07/20/2012  . Blood in stool   . Chicken pox   . Depression    postpartum with one child  . GERD 01/31/2007   Qualifier: Diagnosis of  By: [name] RMA, [name]    . Hyperlipidemia    postpartum  . PONV (postoperative nausea and vomiting)   . RAYNAUD'S DISEASE 01/31/2007   Qualifier: Diagnosis of  By: Samara SnideBrand RMA, [name]     Past Surgical History:  Procedure Laterality Date  . CESAREAN SECTION    . LAPAROSCOPIC APPENDECTOMY Right 07/20/2012   Procedure: APPENDECTOMY  LAPAROSCOPIC;  Surgeon: [name], MD;  Location: [facility];  Service: General;  Laterality: Right;  . WISDOM TOOTH EXTRACTION     Allergies  Allergen Reactions  . Clarithromycin Shortness Of Breath    REACTION: hives  . Latex     Skin irritation from gloves   No outpatient medications have been marked as taking for the 01/01/19 encounter (Office Visit) with Wynn BankerKoberlein, Junell C, MD.   Social History   Tobacco Use  . Smoking status: Never Smoker  . Smokeless tobacco: Never Used  Substance Use Topics  . Alcohol use: No   Family History  Problem Relation Age of Onset  . Early menopause Mother   . Other Father        no contact since infant  . Cancer Maternal Grandmother        melanoma  . Stroke Maternal Grandmother [id number]  . Arthritis Maternal Grandmother   . Hyperlipidemia Maternal Grandmother   . Hypertension Maternal Grandmother   . Breast cancer Maternal Grandmother 72  . Cancer Maternal Grandfather        bladder  . Lung cancer Other        all but one of grandmother's siblings      Review of Systems  Constitutional: Negative for chills, fatigue and fever.  HENT: Negative for congestion, ear discharge and ear pain.        Ear pressure/see HPI  Respiratory: Negative for cough,  chest tightness, shortness of breath and wheezing.   Cardiovascular: Negative for chest pain, palpitations and leg swelling.    Objective:  There were no vitals taken for this visit.      BP Readings from Last 3 Encounters:  07/16/18 124/72  01/13/18 98/78  10/13/17 102/80   Wt Readings from Last 3 Encounters:  07/16/18 160 lb 8 oz (72.8 kg)  01/13/18 154 lb 9.6 oz (70.1 kg)  10/13/17 146 lb 3.2 oz (66.3 kg)    EXAM:  GENERAL: alert, oriented, appears well and in no acute distress  HEENT: atraumatic, conjunctiva clear, no obvious abnormalities on inspection of external nose and ears  NECK: normal movements of the head and neck  LUNGS: on inspection no signs of respiratory  distress, breathing rate appears normal, no obvious gross SOB, gasping or wheezing  CV: no obvious cyanosis  MS: moves all visible extremities without noticeable abnormality  PSYCH/NEURO: pleasant and cooperative, no obvious depression or anxiety, speech and thought processing grossly intact  SKIN: no facial or neck abnormalities noted.   Assessment/Plan  1. Dysfunction of both eustachian tubes Encouraged her to work on taking Claritin on a daily basis and adding in Flonase to help open up eustachian tubes.  Consider ENT referral if not having improvement with ear fullness.  2. Hyperlipidemia, unspecified hyperlipidemia type We will help her set up lab visit to recheck cholesterol.  She would like to work on lifestyle changes including healthier eating and regular exercise before completing this.    Return for physical exam sept-oct with labwork prior.   I discussed the assessment and treatment plan with the patient. The patient was provided an opportunity to ask questions and all were answered. The patient agreed with the plan and demonstrated an understanding of the instructions.   The patient was advised to call back or seek an in-person evaluation if the symptoms worsen or if the condition fails to improve as anticipated.  I provided 20 minutes of non-face-to-face time during this encounter.   Micheline Rough, MD

## 2019-01-01 NOTE — Patient Instructions (Signed)
Health Maintenance Due  Topic Date Due  . INFLUENZA VACCINE  12/12/2018    Depression screen [facility] 2/9 07/16/2018 10/14/2017  Decreased Interest 2 0  Down, Depressed, Hopeless 1 -  PHQ - 2 Score 3 0  Altered sleeping 3 1  Tired, decreased energy 3 2  Change in appetite 0 0  Feeling bad or failure about yourself  1 1  Trouble concentrating 2 1  Moving slowly or fidgety/restless 0 0  Suicidal thoughts 0 0  PHQ-9 Score 12 5

## 2019-01-04 ENCOUNTER — Telehealth: Payer: Self-pay | Admitting: *Deleted

## 2019-01-04 NOTE — Telephone Encounter (Signed)
-----   Message from Caren Macadam, MD sent at 01/01/2019  7:57 PM EDT ----- Worthy Keeler proir to physical in sept-oct range. Schedule as patient desires (labs ahead of time or at same time is fine)

## 2019-01-04 NOTE — Telephone Encounter (Signed)
Appt scheduled for 10/19 at 9am.

## 2019-02-08 ENCOUNTER — Telehealth: Payer: Self-pay | Admitting: Family Medicine

## 2019-02-08 NOTE — Telephone Encounter (Signed)
I called the pt to get more information and left a message to call the office as I do not see an anxiety medication on her medication list.

## 2019-02-08 NOTE — Telephone Encounter (Signed)
Pt would like to see if she could get her anxiety medication refilled until her appointment on 02/17/2019 she state she is having a hard time getting through with everything.  Pt would like to have a call back to let her know if this medication will be called in.  She also stated if you need to speak with her husband it is okay.

## 2019-02-08 NOTE — Telephone Encounter (Signed)
If she has been taking these regularly, then I am ok with refill until appointment at whatever her current dose is. Please clarify dose with her as there are a couple different ones listed. If wanting on Tuesday would be ok to send to [facility] for fill until visit with me.

## 2019-02-08 NOTE — Telephone Encounter (Signed)
Patient is calling back and states the medication is citalopram (CELEXA) 10 MG tablet [[id number]] DISCONTINUED And Neurontin.   Please advise Cb- [phone] Preferred [place].   Thank you!

## 2019-02-09 ENCOUNTER — Telehealth (INDEPENDENT_AMBULATORY_CARE_PROVIDER_SITE_OTHER): Payer: No Typology Code available for payment source | Admitting: Family Medicine

## 2019-02-09 ENCOUNTER — Encounter: Payer: Self-pay | Admitting: Family Medicine

## 2019-02-09 ENCOUNTER — Other Ambulatory Visit: Payer: Self-pay

## 2019-02-09 VITALS — Temp 98.5°F

## 2019-02-09 DIAGNOSIS — F329 Major depressive disorder, single episode, unspecified: Secondary | ICD-10-CM | POA: Diagnosis not present

## 2019-02-09 DIAGNOSIS — F419 Anxiety disorder, unspecified: Secondary | ICD-10-CM | POA: Diagnosis not present

## 2019-02-09 DIAGNOSIS — F32A Depression, unspecified: Secondary | ICD-10-CM

## 2019-02-09 MED ORDER — GABAPENTIN 100 MG PO CAPS: 100.0000 mg | ORAL_CAPSULE | Freq: Three times a day (TID) | ORAL | 1 refills | Status: DC

## 2019-02-09 MED ORDER — CITALOPRAM HYDROBROMIDE 20 MG PO TABS: 20.0000 mg | ORAL_TABLET | Freq: Every day | ORAL | 1 refills | Status: DC

## 2019-02-09 NOTE — Telephone Encounter (Signed)
Called the pt and informed her of the message below.  Appt scheduled for today at 12pm.

## 2019-02-09 NOTE — Telephone Encounter (Signed)
Can you set her up a virtual visit with me or Dr. Ethlyn Gallery asap? At her last visit she was weaning off of these as she felt they did not work. May need something different. Thanks.

## 2019-02-09 NOTE — Progress Notes (Addendum)
Virtual Visit via Video Note  I connected with Lindsay Wong  on 02/09/19 at 12:00 PM EDT by a video enabled telemedicine application and verified that I am speaking with the correct person using two identifiers.  Location patient: home Location provider:work or home office Persons participating in the virtual visit: patient, provider   HPI:  Acute visit for Anxiety: -longstanding, on Neurontin and celexa in the past - at the last visit she was self weaning off of these, at that time she preferred to see behavioral health Dennison Bulla) for CBT (reports was doing better so did not see Lanny Hurst or do neurology evaluation) -she saw psychiatrist remotely whom put her on the combo of Neurontin and celexa -reports she has taken "lots of medications" in the past and they all were a "disaster" -reports the combination of celexa and neurontin really was the only combination of medication that helped without giving her severe side effects (reports prozac, lexapro, zoloft, paxil all caused side effects) -she had been doing ok, but has had worsening anxiety for about a month and a half, then had a lot of crying and feeling overwhelmed yesterday, poor focus, panic at times -her husband, whom is a physician told her she really did better when stayed on her meds and she wants to restart them -she reports benzos will make her feel out of it for days and she wants to avoid them -today reports: anxiety is the main symptoms, feeling overwhelmed at times, sometimes feel hopeless and emotional, lack of motivation, panic attacks at times -denies: manic symptoms, SI currently, has had feelings of hopelessness but never a plan to hurt herself or others and does not feel she ever would, feels safe -new PCP is Dr. Ethlyn Gallery and she has CPE coming up    ROS: See pertinent positives and negatives per HPI.  Past Medical History:  Diagnosis Date  . Allergy   . Anxiety and depression    per patient diagnosed by psychiatrist   . Appendicitis 07/20/2012  . Blood in stool   . Chicken pox   . Depression    postpartum with one child  . GERD 01/31/2007   Qualifier: Diagnosis of  By: Marca Ancona RMA, [name]    . Hyperlipidemia    postpartum  . PONV (postoperative nausea and vomiting)   . RAYNAUD'S DISEASE 01/31/2007   Qualifier: Diagnosis of  By: Larose Kells      Past Surgical History:  Procedure Laterality Date  . CESAREAN SECTION    . LAPAROSCOPIC APPENDECTOMY Right 07/20/2012   Procedure: APPENDECTOMY LAPAROSCOPIC;  Surgeon: Earnstine Regal, MD;  Location: [facility];  Service: General;  Laterality: Right;  . WISDOM TOOTH EXTRACTION      Family History  Problem Relation Age of Onset  . Early menopause Mother   . Other Father        no contact since infant  . Cancer Maternal Grandmother        melanoma  . Stroke Maternal Grandmother 69  . Arthritis Maternal Grandmother   . Hyperlipidemia Maternal Grandmother   . Hypertension Maternal Grandmother   . Breast cancer Maternal Grandmother 72  . Cancer Maternal Grandfather        bladder  . Lung cancer Other        all but one of grandmother's siblings     SOCIAL HX: see hpi   Current Outpatient Medications:  .  citalopram (CELEXA) 20 MG tablet, Take 1 tablet (20 mg total) by mouth daily., Disp:  30 tablet, Rfl: 1 .  gabapentin (NEURONTIN) 100 MG capsule, Take 1 capsule (100 mg total) by mouth 3 (three) times daily., Disp: 90 capsule, Rfl: 1  EXAM:  VITALS per patient if applicable:  GENERAL: alert, oriented, appears well and in no acute distress  HEENT: atraumatic, conjunttiva clear, no obvious abnormalities on inspection of external nose and ears  NECK: normal movements of the head and neck  LUNGS: on inspection no signs of respiratory distress, breathing rate appears normal, no obvious gross SOB, gasping or wheezing  CV: no obvious cyanosis  MS: moves all visible extremities without noticeable abnormality  PSYCH/NEURO: pleasant and cooperative,  no obvious depression or anxiety, speech and thought processing grossly intact  ASSESSMENT AND PLAN:  Discussed the following assessment and plan: More than 50% of over 25 minutes spent in total in caring for this patient was spent counseling and/or coordinating care.    Anxiety and depression  -we discussed possible serious and likely etiologies, options for evaluation and workup, limitations of telemedicine visit vs in person visit, treatment, treatment risks and precautions. Pt prefers to treat via telemedicine empirically rather then risking or undertaking an in person visit at this moment. She has opted to restart celexa at 20mg  daily and neurontin low dose 100mg  at bedtime, may increase to tid if needed. Sent to pharmacy. Advised CBT as well and she agrees to call - number in patient instructions. May need to see psychiatry again if not improving or worsening. Follow up 1 month, sooner as needed. Verbal agreement to seek emergency care if any severe symptoms or thoughts of harm, SI. Patient agrees to seek prompt in person care if worsening, new symptoms arise, or if is not improving with treatment.   I discussed the assessment and treatment plan with the patient. The patient was provided an opportunity to ask questions and all were answered. The patient agreed with the plan and demonstrated an understanding of the instructions.     , DO   Patient Instructions  Please call [phone] to schedule Cognitive Behavioral Therapy with Terressa Koyanagi or another one of the [place] behavioral health specialists.  Can restart the Celexa 20mg  daily and the Neurontin 100mg  at bedtime. If needed, may increase the neurotin to twice daily or three times daily.  I hope you are feeling better soon! Seek care promptly if your symptoms worsen, new concerns arise or you are not improving with treatment.  Seek emergency care if severe symptoms or thoughts of hurting yourself or  others.  Follow up in 1 month. Keep physical with Dr. [phone].

## 2019-02-09 NOTE — Telephone Encounter (Signed)
I called the pt and she stated she has been off of Citalopram 10mg  daily and Neurontin 300mg -TID since the last visit with Dr Maudie Mercury.  Message sent to Dr Maudie Mercury.

## 2019-02-09 NOTE — Patient Instructions (Signed)
Please call [phone] to schedule Cognitive Behavioral Therapy with Dennison Bulla or another one of the [place] behavioral health specialists.  Can restart the Celexa 20mg  daily and the Neurontin 100mg  at bedtime. If needed, may increase the neurotin to twice daily or three times daily.  I hope you are feeling better soon! Seek care promptly if your symptoms worsen, new concerns arise or you are not improving with treatment.  Seek emergency care if severe symptoms or thoughts of hurting yourself or others.  Follow up in 1 month. Keep physical with Dr. Ethlyn Gallery.

## 2019-02-17 ENCOUNTER — Telehealth: Payer: No Typology Code available for payment source | Admitting: Family Medicine

## 2019-02-17 NOTE — Progress Notes (Deleted)
Virtual Visit via Video Note  I connected with Lindsay Wong  on 02/17/19 at  2:30 PM EDT by a video enabled telemedicine application and verified that I am speaking with the correct person using two identifiers.  Location patient: home Location provider:work or home office Persons participating in the virtual visit: patient, provider  I discussed the limitations of evaluation and management by telemedicine and the availability of in person appointments. The patient expressed understanding and agreed to proceed.   Lindsay Wong DOB: [birth year] Encounter date: 02/17/2019  This is a 38 y.o. female who presents with No chief complaint on file.   History of present illness: Recently saw [facility] virtually and discussed anxiety. celexa and neurontin were restarted. This was best historical combo for her (has not tolerated multiple meds in past; per [facility] note "reports prozac, lexapro, zoloft, paxil all caused side effects" HPI   Allergies  Allergen Reactions  . Clarithromycin Shortness Of Breath    REACTION: hives  . Latex     Skin irritation from gloves   No outpatient medications have been marked as taking for the 02/17/19 encounter (Appointment) with Caren Macadam, MD.    Review of Systems  Objective:  There were no vitals taken for this visit.      BP Readings from Last 3 Encounters:  07/16/18 124/72  01/13/18 98/78  10/13/17 102/80   Wt Readings from Last 3 Encounters:  07/16/18 160 lb 8 oz (72.8 kg)  01/13/18 154 lb 9.6 oz (70.1 kg)  10/13/17 146 lb 3.2 oz (66.3 kg)    EXAM:  GENERAL: alert, oriented, appears well and in no acute distress  HEENT: atraumatic, conjunctiva clear, no obvious abnormalities on inspection of external nose and ears  NECK: normal movements of the head and neck  LUNGS: on inspection no signs of respiratory distress, breathing rate appears normal, no obvious gross SOB, gasping or wheezing  CV: no obvious cyanosis  MS: moves all visible  extremities without noticeable abnormality  PSYCH/NEURO: pleasant and cooperative, no obvious depression or anxiety, speech and thought processing grossly intact ***  Assessment/Plan  There are no diagnoses linked to this encounter.     I discussed the assessment and treatment plan with the patient. The patient was provided an opportunity to ask questions and all were answered. The patient agreed with the plan and demonstrated an understanding of the instructions.   The patient was advised to call back or seek an in-person evaluation if the symptoms worsen or if the condition fails to improve as anticipated.  I provided *** minutes of non-face-to-face time during this encounter.   Micheline Rough, MD

## 2019-03-01 ENCOUNTER — Ambulatory Visit (INDEPENDENT_AMBULATORY_CARE_PROVIDER_SITE_OTHER): Payer: No Typology Code available for payment source | Admitting: Family Medicine

## 2019-03-01 ENCOUNTER — Other Ambulatory Visit: Payer: Self-pay

## 2019-03-01 ENCOUNTER — Encounter: Payer: Self-pay | Admitting: Family Medicine

## 2019-03-01 VITALS — BP 100/80 | HR 99 | Temp 98.0°F | Ht 62.75 in | Wt 149.5 lb

## 2019-03-01 DIAGNOSIS — F419 Anxiety disorder, unspecified: Secondary | ICD-10-CM | POA: Diagnosis not present

## 2019-03-01 DIAGNOSIS — E785 Hyperlipidemia, unspecified: Secondary | ICD-10-CM

## 2019-03-01 DIAGNOSIS — F32A Depression, unspecified: Secondary | ICD-10-CM

## 2019-03-01 DIAGNOSIS — Z0000 Encounter for general adult medical examination without abnormal findings: Secondary | ICD-10-CM

## 2019-03-01 DIAGNOSIS — Z23 Encounter for immunization: Secondary | ICD-10-CM

## 2019-03-01 DIAGNOSIS — N920 Excessive and frequent menstruation with regular cycle: Secondary | ICD-10-CM | POA: Diagnosis not present

## 2019-03-01 DIAGNOSIS — F329 Major depressive disorder, single episode, unspecified: Secondary | ICD-10-CM

## 2019-03-01 DIAGNOSIS — L309 Dermatitis, unspecified: Secondary | ICD-10-CM

## 2019-03-01 LAB — CBC WITH DIFFERENTIAL/PLATELET
Basophils Absolute: 0 10*3/uL (ref 0.0–0.1)
Basophils Relative: 0.4 % (ref 0.0–3.0)
Eosinophils Absolute: 0.1 10*3/uL (ref 0.0–0.7)
Eosinophils Relative: 1.7 % (ref 0.0–5.0)
HCT: 41.1 % (ref 36.0–46.0)
Hemoglobin: 13.8 g/dL (ref 12.0–15.0)
Lymphocytes Relative: 30.1 % (ref 12.0–46.0)
Lymphs Abs: 2.6 10*3/uL (ref 0.7–4.0)
MCHC: 33.5 g/dL (ref 30.0–36.0)
MCV: 85.9 fl (ref 78.0–100.0)
Monocytes Absolute: 0.5 10*3/uL (ref 0.1–1.0)
Monocytes Relative: 6 % (ref 3.0–12.0)
Neutro Abs: 5.4 10*3/uL (ref 1.4–7.7)
Neutrophils Relative %: 61.8 % (ref 43.0–77.0)
Platelets: 295 10*3/uL (ref 150.0–400.0)
RBC: 4.79 Mil/uL (ref 3.87–5.11)
RDW: 13.6 % (ref 11.5–15.5)
WBC: 8.7 10*3/uL (ref 4.0–10.5)

## 2019-03-01 LAB — LIPID PANEL
Cholesterol: 234 mg/dL — ABNORMAL HIGH (ref 0–200)
HDL: 47.8 mg/dL (ref >39.00)
LDL Cholesterol: 161 mg/dL — ABNORMAL HIGH (ref 0–99)
NonHDL: 186.04
Total CHOL/HDL Ratio: 5
Triglycerides: 123 mg/dL (ref 0.0–149.0)
VLDL: 24.6 mg/dL (ref 0.0–40.0)

## 2019-03-01 LAB — IBC + FERRITIN
Ferritin: 23 ng/mL (ref 10.0–291.0)
Iron: 58 ug/dL (ref 42–145)
Saturation Ratios: 15.9 % — ABNORMAL LOW (ref 20.0–50.0)
Transferrin: 260 mg/dL (ref 212.0–360.0)

## 2019-03-01 MED ORDER — BETAMETHASONE DIPROPIONATE 0.05 % EX CREA: TOPICAL_CREAM | Freq: Two times a day (BID) | CUTANEOUS | 0 refills | Status: DC

## 2019-03-01 MED ORDER — GABAPENTIN 300 MG PO CAPS: 300.0000 mg | ORAL_CAPSULE | Freq: Three times a day (TID) | ORAL | 1 refills | Status: DC

## 2019-03-01 MED ORDER — CITALOPRAM HYDROBROMIDE 40 MG PO TABS: 40.0000 mg | ORAL_TABLET | Freq: Every day | ORAL | 1 refills | Status: DC

## 2019-03-01 MED FILL — BETAMETHASONE DP 0.05% CRM: 0.05 | 15 days supply | Qty: 30 | Fill #0

## 2019-03-01 MED FILL — GABAPENTIN 300 MG CAPSULE: 300 | 90 days supply | Qty: 270 | Fill #0

## 2019-03-01 MED FILL — CITALOPRAM HBR 40 MG TABLET: 40 | 90 days supply | Qty: 90 | Fill #0

## 2019-03-01 NOTE — Patient Instructions (Signed)
Consider B12 1039mcg sublingual daily or a few days/week  Consider vitamin D 1000 units daily

## 2019-03-01 NOTE — Progress Notes (Signed)
Lindsay Wong DOB: 1980/07/01 Encounter date: 03/01/2019  This is a 38 y.o. female who presents for complete physical   History of present illness/Additional concerns:  Saw [facility] virtual 9/29 for anxiety: was restarted on celexa and neurontin. Doing a little bit better. Was originally on 300mg  of the neurontin. "I just can't function". Just so overwhelmed with everything. Virtual school, everything she is trying to manage. Hard to get going. Very tired; sleeping more, but never rested. Just goes through phases where she is just physically so tired. Has been like this in past. In past would just go away after awhile. No snoring when sleeping. Feels like the hopelessness has improved. Anxiety is usually constant; has improved some. Usually gets very anxious and then it builds and when she crashes then tends to have more depression. Doesn't do well with other medications: benzos knock her out; worries about addictive potential. Hasn't called for behavioral health. Barely getting through bare minimum of what she needs to do.   HL: working on lifestyle changes.   Followed with Dr. Philis Pique for gyn needs. Last pap was 09/2016. Negative. Periods are normal, regular. Last period more blood clots. Usually doesn't have clots like this. Usually 3-5 days. Has to change every hour on heavy days. Has been heavier after having kids.   Last mammogram 05/2018. Recommend repeat 05/2019.   Past Medical History:  Diagnosis Date  . Allergy   . Anxiety and depression    per patient diagnosed by psychiatrist  . Appendicitis 07/20/2012  . Blood in stool   . Chicken pox   . Depression    postpartum with one child  . GERD 01/31/2007   Qualifier: Diagnosis of  By: Marca Ancona RMA, [name]    . Hyperlipidemia    postpartum  . PONV (postoperative nausea and vomiting)   . RAYNAUD'S DISEASE 01/31/2007   Qualifier: Diagnosis of  By: Larose Kells     Past Surgical History:  Procedure Laterality Date  . CESAREAN SECTION    .  LAPAROSCOPIC APPENDECTOMY Right 07/20/2012   Procedure: APPENDECTOMY LAPAROSCOPIC;  Surgeon: Earnstine Regal, MD;  Location: [facility];  Service: General;  Laterality: Right;  . WISDOM TOOTH EXTRACTION     Allergies  Allergen Reactions  . Clarithromycin Shortness Of Breath    REACTION: hives  . Latex     Skin irritation from gloves   Current Meds  Medication Sig  . cetirizine (ZYRTEC) 10 MG tablet Take 10 mg by mouth daily.  . [DISCONTINUED] citalopram (CELEXA) 20 MG tablet Take 1 tablet (20 mg total) by mouth daily.  . [DISCONTINUED] gabapentin (NEURONTIN) 100 MG capsule Take 1 capsule (100 mg total) by mouth 3 (three) times daily.   Social History   Tobacco Use  . Smoking status: Never Smoker  . Smokeless tobacco: Never Used  Substance Use Topics  . Alcohol use: No   Family History  Problem Relation Age of Onset  . Early menopause Mother   . Other Father        no contact since infant  . Cancer Maternal Grandmother        melanoma  . Stroke Maternal Grandmother 69  . Arthritis Maternal Grandmother   . Hyperlipidemia Maternal Grandmother   . Hypertension Maternal Grandmother   . Breast cancer Maternal Grandmother 72  . Cancer Maternal Grandfather        bladder  . Lung cancer Other        all but one of grandmother's siblings  Review of Systems  Constitutional: Negative for activity change, appetite change, chills, fatigue, fever and unexpected weight change.  HENT: Negative for congestion, ear pain, hearing loss, sinus pressure, sinus pain, sore throat and trouble swallowing.   Eyes: Negative for pain and visual disturbance.  Respiratory: Negative for cough, chest tightness, shortness of breath and wheezing.   Cardiovascular: Negative for chest pain, palpitations and leg swelling.  Gastrointestinal: Negative for abdominal pain, blood in stool, constipation, diarrhea, nausea and vomiting.  Genitourinary: Negative for difficulty urinating and menstrual problem.   Musculoskeletal: Negative for arthralgias and back pain.  Skin: Negative for rash.       Gets dry bump on skin; scratches/picks at these. Tried hydrocortisone without difficulty.  Neurological: Negative for dizziness, weakness, numbness and headaches.  Hematological: Negative for adenopathy. Does not bruise/bleed easily.  Psychiatric/Behavioral: Negative for sleep disturbance and suicidal ideas. The patient is not nervous/anxious.     CBC:  Lab Results  Component Value Date   WBC 6.8 07/17/2018   HGB 14.0 07/17/2018   HCT 40.5 07/17/2018   MCH 29.1 07/20/2012   MCHC 34.5 07/17/2018   RDW 13.2 07/17/2018   PLT 297.0 07/17/2018   CMP: Lab Results  Component Value Date   NA 137 07/17/2018   K 4.4 07/17/2018   CL 101 07/17/2018   CO2 29 07/17/2018   GLUCOSE 90 07/17/2018   BUN 10 07/17/2018   CREATININE 0.92 07/17/2018   GFRAA >90 07/20/2012   CALCIUM 9.8 07/17/2018   PROT 7.4 07/17/2018   BILITOT 0.7 07/17/2018   ALKPHOS 89 07/17/2018   ALT 13 07/17/2018   AST 13 07/17/2018   LIPID: Lab Results  Component Value Date   CHOL 246 (H) 07/17/2018   TRIG 171.0 (H) 07/17/2018   HDL 50.20 07/17/2018   LDLCALC 162 (H) 07/17/2018    Objective:  BP 100/80 (BP Location: Left Arm, Patient Position: Sitting, Cuff Size: Normal)   Pulse 99   Temp 98 F (36.7 C) (Temporal)   Ht 5' 2.75" (1.594 m)   Wt 149 lb 8 oz (67.8 kg)   SpO2 98%   BMI 26.69 kg/m   Weight: 149 lb 8 oz (67.8 kg)   BP Readings from Last 3 Encounters:  03/01/19 100/80  07/16/18 124/72  01/13/18 98/78   Wt Readings from Last 3 Encounters:  03/01/19 149 lb 8 oz (67.8 kg)  07/16/18 160 lb 8 oz (72.8 kg)  01/13/18 154 lb 9.6 oz (70.1 kg)    Physical Exam Constitutional:      General: She is not in acute distress.    Appearance: She is well-developed.  HENT:     Head: Normocephalic and atraumatic.     Right Ear: External ear normal.     Left Ear: External ear normal.     Mouth/Throat:      Pharynx: No oropharyngeal exudate.  Eyes:     Conjunctiva/sclera: Conjunctivae normal.     Pupils: Pupils are equal, round, and reactive to light.  Neck:     Musculoskeletal: Normal range of motion and neck supple.     Thyroid: No thyromegaly.  Cardiovascular:     Rate and Rhythm: Normal rate and regular rhythm.     Heart sounds: Normal heart sounds. No murmur. No friction rub. No gallop.   Pulmonary:     Effort: Pulmonary effort is normal.     Breath sounds: Normal breath sounds.  Abdominal:     General: Bowel sounds are normal. There is no  distension.     Palpations: Abdomen is soft. There is no mass.     Tenderness: There is no abdominal tenderness. There is no guarding.     Hernia: No hernia is present.  Musculoskeletal: Normal range of motion.        General: No tenderness or deformity.  Lymphadenopathy:     Cervical: No cervical adenopathy.  Skin:    General: Skin is warm and dry.     Findings: No rash.     Comments: Erythematous papules right arm; excoriated, thickened.  Neurological:     Mental Status: She is alert and oriented to person, place, and time.     Deep Tendon Reflexes: Reflexes normal.     Reflex Scores:      Tricep reflexes are 2+ on the right side and 2+ on the left side.      Bicep reflexes are 2+ on the right side and 2+ on the left side.      Brachioradialis reflexes are 2+ on the right side and 2+ on the left side.      Patellar reflexes are 2+ on the right side and 2+ on the left side. Psychiatric:        Speech: Speech normal.        Behavior: Behavior normal.        Thought Content: Thought content normal.     Assessment/Plan: Health Maintenance Due  Topic Date Due  . INFLUENZA VACCINE  12/12/2018   Health Maintenance reviewed.  1. Preventative health care She would like to wait until next visit to do Pap smear.  Technically she will be due in May 2021, so we can do this along with her skin exam at that time.  Otherwise she is up-to-date  with mammogram.  2. Anxiety and depression She is still having a difficult time with anxiety and depression.  We are going to increase the Celexa to 40 mg.  This is the dose she was previously on.  We are also going to increase her Neurontin (she was previously doing 300 mg 3 times daily).  She is can to start with 300 mg at bedtime and then work up to 300 mg 3 times daily needed.  She will let us know if any worsening of mood in the meanwhile.  I did encourage her to call for therapy today since there is some delay with getting in with therapy at the present time. - gabapentin (NEURONTIN) 300 MG capsule; Take 1 capsule (300 mg total) by mouth 3 (three) times daily.  Dispense: 270 capsule; Refill: 1 - citalopram (CELEXA) 40 MG tablet; Take 1 tablet (40 mg total) by mouth daily.  Dispense: 90 tablet; Refill: 1  3. Hyperlipidemia, unspecified hyperlipidemia type  - Lipid panel; Future  4. Menorrhagia with regular cycle  - CBC with Differential/Platelet; Future - IBC + Ferritin; Future  5. Need for Tdap vaccination  - Tdap vaccine greater than or equal to 7yo IM  6. Eczema, unspecified type  - betamethasone dipropionate 0.05 % cream; Apply topically 2 (two) times daily.  Dispense: 30 g; Refill: 0  Return in about 1 month (around 04/01/2019) for 1 month virtual with [facility] or myself.  Theodis ShoveJunell Koberlein, MD  Repeat pap in 09/2019; will do skin exam at that time

## 2019-03-02 ENCOUNTER — Encounter: Payer: Self-pay | Admitting: Family Medicine

## 2019-03-18 ENCOUNTER — Other Ambulatory Visit: Payer: Self-pay

## 2019-03-18 DIAGNOSIS — Z20822 Contact with and (suspected) exposure to covid-19: Secondary | ICD-10-CM

## 2019-03-20 LAB — NOVEL CORONAVIRUS, NAA: SARS-CoV-2, NAA: NOT DETECTED

## 2019-05-28 ENCOUNTER — Telehealth (INDEPENDENT_AMBULATORY_CARE_PROVIDER_SITE_OTHER): Payer: No Typology Code available for payment source | Admitting: Family Medicine

## 2019-05-28 DIAGNOSIS — J0100 Acute maxillary sinusitis, unspecified: Secondary | ICD-10-CM | POA: Diagnosis not present

## 2019-05-28 DIAGNOSIS — Z7189 Other specified counseling: Secondary | ICD-10-CM

## 2019-05-28 DIAGNOSIS — J069 Acute upper respiratory infection, unspecified: Secondary | ICD-10-CM | POA: Diagnosis not present

## 2019-05-28 MED ORDER — BENZONATATE 100 MG PO CAPS: 100.0000 mg | ORAL_CAPSULE | Freq: Two times a day (BID) | ORAL | 0 refills | Status: DC | PRN

## 2019-05-28 MED ORDER — AMOXICILLIN 500 MG PO TABS: 500.0000 mg | ORAL_TABLET | Freq: Two times a day (BID) | ORAL | 0 refills | Status: AC

## 2019-05-28 MED FILL — BENZONATATE 100 MG CAPS: 100 | 10 days supply | Qty: 20 | Fill #0

## 2019-05-28 MED FILL — AMOXICILLIN 500 MG CAPSULE: 500 | 7 days supply | Qty: 14 | Fill #0

## 2019-05-28 NOTE — Progress Notes (Addendum)
Virtual Visit via Telephone Note Pt called 2/2 difficulties connecting via doxy. I connected with Lindsay Wong on 05/28/19 at  2:30 PM EST by telephone and verified that I am speaking with the correct person using two identifiers.   I discussed the limitations, risks, security and privacy concerns of performing an evaluation and management service by telephone and the availability of in person appointments. I also discussed with the patient that there may be a patient responsible charge related to this service. The patient expressed understanding and agreed to proceed.  Location patient: home Location provider: work or home office Participants present for the call: patient, provider Patient did not have a visit in the prior 7 days to address this/these issue(s).   History of Present Illness: Pt is a 39 yo female with pmh sig for Raynaud's dz, h/o anxiety and depression followed by Dr. Hassan Rowan.  Pt seen for acute concern of HA and post nasal gtt x 5 days.  Last night developed cough.  Pt notes SOB/"feeling winded" and increased cough when up moving around.  Also endorses mild rhinorrhea and pressure between and underneath eyes. Denies fever, n/v, diarrhea, loss of taste or smell, sick contacts.  Tried Tylenol for symptoms.   Observations/Objective: Patient sounds cheerful and well on the phone. I do not appreciate any SOB. Speech and thought processing are grossly intact. Patient reported vitals:  Assessment and Plan: Acute non-recurrent maxillary sinusitis  - Plan: amoxicillin (AMOXIL) 500 MG tablet -given precautions  Viral URI with cough  -discussed possible causes including but not limited to acute nasopharyngitis, sinusitis, influenza, cannot r/o Covid-19 infection -discussed supportive care -self quarantine -consider COVID testing -Pt given precautions - Plan: benzonatate (TESSALON) 100 MG capsule  Educated about COVID-19 virus infection -discussed s/s -given info on area  testing sites. -self quarantine -for worsening symptoms proceed to nearest UC or ED.  Follow Up Instructions: prn  I did not refer this patient for an OV in the next 24 hours for this/these issue(s).  I discussed the assessment and treatment plan with the patient. The patient was provided an opportunity to ask questions and all were answered. The patient agreed with the plan and demonstrated an understanding of the instructions.   The patient was advised to call back or seek an in-person evaluation if the symptoms worsen or if the condition fails to improve as anticipated.  I provided 11 minutes of non-face-to-face time during this encounter.    Deeann Saint, MD

## 2019-06-07 ENCOUNTER — Encounter: Payer: Self-pay | Admitting: Family Medicine

## 2019-06-08 ENCOUNTER — Other Ambulatory Visit: Payer: Self-pay | Admitting: Family Medicine

## 2019-06-08 DIAGNOSIS — R4189 Other symptoms and signs involving cognitive functions and awareness: Secondary | ICD-10-CM

## 2019-06-08 NOTE — Addendum Note (Signed)
Addended by: Wynn Banker on: 06/08/2019 01:43 PM   Modules accepted: Orders

## 2019-06-30 ENCOUNTER — Other Ambulatory Visit: Payer: Self-pay | Admitting: Family Medicine

## 2019-06-30 DIAGNOSIS — N6489 Other specified disorders of breast: Secondary | ICD-10-CM

## 2019-07-21 ENCOUNTER — Other Ambulatory Visit: Payer: Self-pay | Admitting: Family Medicine

## 2019-07-21 DIAGNOSIS — Z1231 Encounter for screening mammogram for malignant neoplasm of breast: Secondary | ICD-10-CM

## 2019-07-22 ENCOUNTER — Other Ambulatory Visit: Payer: Self-pay

## 2019-07-23 ENCOUNTER — Encounter: Payer: Self-pay | Admitting: Family Medicine

## 2019-07-23 ENCOUNTER — Ambulatory Visit (INDEPENDENT_AMBULATORY_CARE_PROVIDER_SITE_OTHER): Payer: No Typology Code available for payment source | Admitting: Family Medicine

## 2019-07-23 VITALS — BP 120/80 | HR 96 | Temp 97.7°F | Ht 62.75 in | Wt 153.5 lb

## 2019-07-23 DIAGNOSIS — F32A Depression, unspecified: Secondary | ICD-10-CM

## 2019-07-23 DIAGNOSIS — R0602 Shortness of breath: Secondary | ICD-10-CM

## 2019-07-23 DIAGNOSIS — I7300 Raynaud's syndrome without gangrene: Secondary | ICD-10-CM

## 2019-07-23 DIAGNOSIS — F419 Anxiety disorder, unspecified: Secondary | ICD-10-CM

## 2019-07-23 DIAGNOSIS — R5383 Other fatigue: Secondary | ICD-10-CM | POA: Diagnosis not present

## 2019-07-23 DIAGNOSIS — F329 Major depressive disorder, single episode, unspecified: Secondary | ICD-10-CM

## 2019-07-23 DIAGNOSIS — L989 Disorder of the skin and subcutaneous tissue, unspecified: Secondary | ICD-10-CM | POA: Diagnosis not present

## 2019-07-23 NOTE — Progress Notes (Signed)
Lindsay Wong DOB: 10-16-1980 Encounter date: 07/23/2019  This is a 39 y.o. female who presents with Chief Complaint  Patient presents with  . Fatigue    severe for 3 weeks in February  . Headache    severe which lasted for 3 days, states she had jaw pain also  . Back Pain    describes numbess and pain intrascapular area  . patient complains of "snake poop" x2 months    History of present illness: Skin rash still not better. Papules on forearms. She is not sure she is getting more. Steroid cream did not help. Feels like there is some hyperpigmentation around these lesions.  In feb got really tired; slept more in 3 weeks then she didn't sleep. Very different than previous depression; not feeling depressed; really just all her effort to get everyone up and get day started. Very much not her norm. If rolled over in bed, heart racing. Even would get short of breath.   Does still get winded very easily which is not typical for her. Just breathing harder in general. Feels much better than she did.   Having spells where she can't get warm. Using heating blanket; raynauds acting up. Just hard to get warm. Usually feels worse in morning and then better in the afternoon. Struggles first half of day. Not back to normal but better than she was. Had felt like she had fever, but highest she saw was 99.2.   During 3 weeks tried to force self to pick up some in house. Joints stiff, sore, felt like she weighed a ton. Had episode where both sides of shoulder blades where sensation wasn't there - numb on her back.   Has had spells like this before, but was always told it was depression in past. Saw rheumatologist maybe 16 years ago for raynauds; put on bp medicaiton at that time. Doesn't think she had any other work up. Last year had a couple of episodes of similar (but less profound) issues.   Poop not normal. Very thin stools or mush. Feels like she does empty bowels completely.   Taking pepcid  because she has had more aid reflux, burping.   Appetite has been good in general. Eating healthier; good variety.    Allergies  Allergen Reactions  . Clarithromycin Shortness Of Breath    REACTION: hives  . Latex     Skin irritation from gloves   Current Meds  Medication Sig  . betamethasone dipropionate 0.05 % cream Apply topically 2 (two) times daily.  . cetirizine (ZYRTEC) 10 MG tablet Take 10 mg by mouth daily.  . citalopram (CELEXA) 40 MG tablet Take 1 tablet (40 mg total) by mouth daily.  . Cyanocobalamin (B-12 PO) Take by mouth daily.  . Famotidine (PEPCID PO) Take by mouth.  . Ferrous Sulfate (IRON PO) Take by mouth 3 (three) times a week.  Marland Kitchen VITAMIN D PO Take 2,000 Units by mouth.  . [DISCONTINUED] gabapentin (NEURONTIN) 300 MG capsule Take 1 capsule (300 mg total) by mouth 3 (three) times daily. (Patient taking differently: Take 300 mg by mouth at bedtime. )    Review of Systems  Constitutional: Negative for chills, fatigue and fever.  Respiratory: Negative for cough, chest tightness, shortness of breath and wheezing.   Cardiovascular: Negative for chest pain, palpitations and leg swelling.  Gastrointestinal: Negative for abdominal pain, constipation and diarrhea.       Stools have been thinner.  Musculoskeletal:       Currently  not having significant joint pain, but when she was feeling bad she did have swelling of her hands and other joints.  She was very stiff and it made moving around very difficult.    Objective:  BP 120/80 (BP Location: Left Arm, Patient Position: Sitting, Cuff Size: Normal)   Pulse 96   Temp 97.7 F (36.5 C) (Temporal)   Ht 5' 2.75" (1.594 m)   Wt 153 lb 8 oz (69.6 kg)   SpO2 96%   BMI 27.41 kg/m   Weight: 153 lb 8 oz (69.6 kg)   BP Readings from Last 3 Encounters:  07/23/19 120/80  03/01/19 100/80  07/16/18 124/72   Wt Readings from Last 3 Encounters:  07/23/19 153 lb 8 oz (69.6 kg)  03/01/19 149 lb 8 oz (67.8 kg)  07/16/18 160  lb 8 oz (72.8 kg)    Physical Exam Constitutional:      General: She is not in acute distress.    Appearance: She is well-developed.  Cardiovascular:     Rate and Rhythm: Normal rate and regular rhythm.     Heart sounds: Normal heart sounds. No murmur. No friction rub.  Pulmonary:     Effort: Pulmonary effort is normal. No respiratory distress.     Breath sounds: Normal breath sounds. No wheezing or rales.  Abdominal:     General: Bowel sounds are normal. There is no distension.     Palpations: Abdomen is soft.     Tenderness: There is no abdominal tenderness. There is no guarding.  Musculoskeletal:        General: No swelling or tenderness.     Right lower leg: No edema.     Left lower leg: No edema.  Skin:    Comments: Scattered light pink papules on forearms.  There is some faint hyperpigmentation around papules.  Neurological:     Mental Status: She is alert and oriented to person, place, and time.  Psychiatric:        Behavior: Behavior normal.     Assessment/Plan  1. Raynaud's disease without gangrene She has had Raynaud's for a long time, but feels this is getting worse.  I feel that further autoimmune work-up is reasonable.  We did discuss that if all labs are normal, I would be reasonable for [place] to do lab work when she is having another "flare" - ANA; Future  2. Anxiety and depression Mood has been pretty stable overall.  Continue Celexa.  3. Fatigue, unspecified type Very severe/extreme fatigue associated with headache, joint pain further blood work evaluation today and follow-up pending these results. - CBC with Differential/Platelet; Future - C-reactive protein; Future - Rheumatoid factor; Future - Sedimentation rate; Future - SAR CoV2 Serology (COVID 19)AB(IGG)IA; Future - B. burgdorfi Antibody; Future  4. Skin lesions - Ambulatory referral to Dermatology  5. Shortness of breath Lung exam is normal today.  Uncertain if this is secondary to illness that  she had or something more chronic.  Further evaluation pending x-ray results. - DG Chest 2 View; Future    Return for pending lab results.    Theodis Shove, MD

## 2019-07-26 ENCOUNTER — Other Ambulatory Visit: Payer: Self-pay

## 2019-07-27 ENCOUNTER — Other Ambulatory Visit (INDEPENDENT_AMBULATORY_CARE_PROVIDER_SITE_OTHER): Payer: No Typology Code available for payment source

## 2019-07-27 ENCOUNTER — Ambulatory Visit (INDEPENDENT_AMBULATORY_CARE_PROVIDER_SITE_OTHER): Payer: No Typology Code available for payment source

## 2019-07-27 DIAGNOSIS — R0602 Shortness of breath: Secondary | ICD-10-CM

## 2019-07-27 DIAGNOSIS — R5383 Other fatigue: Secondary | ICD-10-CM

## 2019-07-27 DIAGNOSIS — I7300 Raynaud's syndrome without gangrene: Secondary | ICD-10-CM

## 2019-07-27 LAB — CBC WITH DIFFERENTIAL/PLATELET
Basophils Absolute: 0 10*3/uL (ref 0.0–0.1)
Basophils Relative: 0.5 % (ref 0.0–3.0)
Eosinophils Absolute: 0.1 10*3/uL (ref 0.0–0.7)
Eosinophils Relative: 1.4 % (ref 0.0–5.0)
HCT: 41 % (ref 36.0–46.0)
Hemoglobin: 13.7 g/dL (ref 12.0–15.0)
Lymphocytes Relative: 28.9 % (ref 12.0–46.0)
Lymphs Abs: 2.2 10*3/uL (ref 0.7–4.0)
MCHC: 33.4 g/dL (ref 30.0–36.0)
MCV: 86.1 fl (ref 78.0–100.0)
Monocytes Absolute: 0.4 10*3/uL (ref 0.1–1.0)
Monocytes Relative: 5.9 % (ref 3.0–12.0)
Neutro Abs: 4.8 10*3/uL (ref 1.4–7.7)
Neutrophils Relative %: 63.3 % (ref 43.0–77.0)
Platelets: 292 10*3/uL (ref 150.0–400.0)
RBC: 4.76 Mil/uL (ref 3.87–5.11)
RDW: 13.5 % (ref 11.5–15.5)
WBC: 7.6 10*3/uL (ref 4.0–10.5)

## 2019-07-27 LAB — C-REACTIVE PROTEIN: CRP: <1 mg/dL (ref 0.5–20.0)

## 2019-07-27 LAB — SEDIMENTATION RATE: Sed Rate: 22 mm/hr — ABNORMAL HIGH (ref 0–20)

## 2019-07-29 ENCOUNTER — Other Ambulatory Visit: Payer: Self-pay

## 2019-07-29 ENCOUNTER — Ambulatory Visit
Admission: RE | Admit: 2019-07-29 | Discharge: 2019-07-29 | Disposition: A | Discharge status: Home / Self Care | Payer: No Typology Code available for payment source | Source: Ambulatory Visit | Attending: Family Medicine | Admitting: Family Medicine

## 2019-07-29 DIAGNOSIS — Z1231 Encounter for screening mammogram for malignant neoplasm of breast: Secondary | ICD-10-CM

## 2019-07-29 LAB — SAR COV2 SEROLOGY (COVID19)AB(IGG),IA: SARS CoV2 AB IGG: NEGATIVE

## 2019-07-29 LAB — ANTI-NUCLEAR AB-TITER (ANA TITER): ANA Titer 1: 1:640 {titer} — ABNORMAL HIGH

## 2019-07-29 LAB — ANA: Anti Nuclear Antibody (ANA): POSITIVE — AB

## 2019-07-29 LAB — RHEUMATOID FACTOR: Rheumatoid fact SerPl-aCnc: <14 IU/mL (ref <14)

## 2019-07-29 LAB — B. BURGDORFI ANTIBODIES: B burgdorferi Ab IgG+IgM: <0.9 index

## 2019-07-30 ENCOUNTER — Other Ambulatory Visit: Payer: Self-pay | Admitting: Family Medicine

## 2019-07-30 ENCOUNTER — Encounter: Payer: Self-pay | Admitting: Family Medicine

## 2019-07-30 ENCOUNTER — Telehealth: Payer: Self-pay | Admitting: Family Medicine

## 2019-07-30 DIAGNOSIS — R768 Other specified abnormal immunological findings in serum: Secondary | ICD-10-CM

## 2019-07-30 DIAGNOSIS — R5383 Other fatigue: Secondary | ICD-10-CM

## 2019-07-30 NOTE — Telephone Encounter (Signed)
Addressed through mychart

## 2019-07-30 NOTE — Telephone Encounter (Signed)
Had labs done and the ANA titer came back [phone]  She was wanting to know if she can still get her COVID vaccination of Monday.  Please Advise

## 2019-08-03 ENCOUNTER — Encounter: Payer: Self-pay | Admitting: Internal Medicine

## 2019-08-03 ENCOUNTER — Other Ambulatory Visit: Payer: Self-pay

## 2019-08-03 ENCOUNTER — Encounter: Payer: Self-pay | Admitting: Family Medicine

## 2019-08-03 ENCOUNTER — Ambulatory Visit (INDEPENDENT_AMBULATORY_CARE_PROVIDER_SITE_OTHER): Payer: No Typology Code available for payment source | Admitting: Internal Medicine

## 2019-08-03 VITALS — BP 102/78 | HR 99 | Temp 97.5°F | Wt 151.5 lb

## 2019-08-03 DIAGNOSIS — T782XXD Anaphylactic shock, unspecified, subsequent encounter: Secondary | ICD-10-CM

## 2019-08-03 MED ORDER — PREDNISONE 10 MG (21) PO TBPK: ORAL_TABLET | ORAL | 0 refills | Status: DC

## 2019-08-03 MED FILL — predniSONE 10 MG TABS: 10 | 6 days supply | Qty: 21 | Fill #0

## 2019-08-03 NOTE — Patient Instructions (Signed)
-  Nice seeing you today!!  -Take prednisone taper for 6 days as directed.  -Return if any more issues for evaluation.

## 2019-08-03 NOTE — Telephone Encounter (Signed)
Spoke with the pt and informed her Dr Hassan Rowan is not in the office today and does not have openings tomorrow.  Appt scheduled for today with Dr Ardyth Harps at 9:30am.

## 2019-08-03 NOTE — Progress Notes (Signed)
Acute Office Visit     This visit occurred during the SARS-CoV-2 public health emergency.  Safety protocols were in place, including screening questions prior to the visit, additional usage of staff PPE, and extensive cleaning of exam room while observing appropriate contact time as indicated for disinfecting solutions.    CC/Reason for Visit: Covid vaccine reaction  HPI: Lindsay Wong is a 39 y.o. female who is coming in today for the above mentioned reasons.  She went to get her first Covid vaccine yesterday.  About 10 minutes afterwards, while still in her 15-minute observation period, she started describing tingling around her neck and site of injection, soon had difficulty swallowing and trouble breathing.  She notified staff at the vaccination center who injected her with epinephrine and contacted 911.  She was sent to an ED in [place] where she was given Solu-Medrol and observed for 8 hours and was subsequently discharged home.  This morning she feels improved.  No longer has shortness of breath.  She still describes tingling of her arm at the site of injection but otherwise feels well.   Past Medical/Surgical History: Past Medical History:  Diagnosis Date  . Allergy   . Anxiety and depression    per patient diagnosed by psychiatrist  . Appendicitis 07/20/2012  . Blood in stool   . Chicken pox   . Depression    postpartum with one child  . GERD 01/31/2007   Qualifier: Diagnosis of  By: Charlsie Quest RMA, [name]    . Hyperlipidemia    postpartum  . PONV (postoperative nausea and vomiting)   . RAYNAUD'S DISEASE 01/31/2007   Qualifier: Diagnosis of  By: Samara Snide      Past Surgical History:  Procedure Laterality Date  . CESAREAN SECTION    . LAPAROSCOPIC APPENDECTOMY Right 07/20/2012   Procedure: APPENDECTOMY LAPAROSCOPIC;  Surgeon: Velora Heckler, MD;  Location: [facility];  Service: General;  Laterality: Right;  . WISDOM TOOTH EXTRACTION      Social History:  reports  that she has never smoked. She has never used smokeless tobacco. She reports that she does not drink alcohol or use drugs.  Allergies: Allergies  Allergen Reactions  . Clarithromycin Shortness Of Breath    REACTION: hives  . Peg-2000 (Covid-19 Mrna Vaccine Component) Anaphylaxis  . Latex     Skin irritation from gloves    Family History:  Family History  Problem Relation Age of Onset  . Early menopause Mother   . Other Father        no contact since infant  . Cancer Maternal Grandmother        melanoma  . Stroke Maternal Grandmother 47  . Arthritis Maternal Grandmother   . Hyperlipidemia Maternal Grandmother   . Hypertension Maternal Grandmother   . Breast cancer Maternal Grandmother 72  . Cancer Maternal Grandfather        bladder  . Lung cancer Other        all but one of grandmother's siblings   . Lupus Maternal Uncle   . Multiple sclerosis Cousin      Current Outpatient Medications:  .  betamethasone dipropionate 0.05 % cream, Apply topically 2 (two) times daily., Disp: 30 g, Rfl: 0 .  cetirizine (ZYRTEC) 10 MG tablet, Take 10 mg by mouth daily., Disp: , Rfl:  .  citalopram (CELEXA) 40 MG tablet, Take 1 tablet (40 mg total) by mouth daily., Disp: 90 tablet, Rfl: 1 .  Cyanocobalamin (B-12  PO), Take by mouth daily., Disp: , Rfl:  .  Famotidine (PEPCID PO), Take by mouth., Disp: , Rfl:  .  Ferrous Sulfate (IRON PO), Take by mouth 3 (three) times a week., Disp: , Rfl:  .  predniSONE (STERAPRED UNI-PAK 21 TAB) 10 MG (21) TBPK tablet, Take as directed, Disp: 21 tablet, Rfl: 0 .  VITAMIN D PO, Take 2,000 Units by mouth., Disp: , Rfl:   Review of Systems:  Constitutional: Denies fever, chills, diaphoresis, appetite change and fatigue.  HEENT: Denies photophobia, eye pain, redness, hearing loss, ear pain, congestion, sore throat, rhinorrhea, sneezing, mouth sores, trouble swallowing, neck pain, neck stiffness and tinnitus.   Respiratory: Denies SOB, DOE, cough, chest  tightness,  and wheezing.   Cardiovascular: Denies chest pain, palpitations and leg swelling.  Gastrointestinal: Denies nausea, vomiting, abdominal pain, diarrhea, constipation, blood in stool and abdominal distention.  Genitourinary: Denies dysuria, urgency, frequency, hematuria, flank pain and difficulty urinating.  Endocrine: Denies: hot or cold intolerance, sweats, changes in hair or nails, polyuria, polydipsia. Musculoskeletal: Denies myalgias, back pain, joint swelling, arthralgias and gait problem.  Skin: Denies pallor, rash and wound.  Neurological: Denies dizziness, seizures, syncope, weakness, light-headedness, numbness and headaches.  Hematological: Denies adenopathy. Easy bruising, personal or family bleeding history  Psychiatric/Behavioral: Denies suicidal ideation, mood changes, confusion, nervousness, sleep disturbance and agitation    Physical Exam: Vitals:   08/03/19 0914  BP: 102/78  Pulse: 99  Temp: (!) 97.5 F (36.4 C)  TempSrc: Temporal  SpO2: 98%  Weight: 151 lb 8 oz (68.7 kg)    Body mass index is 27.05 kg/m.   Constitutional: NAD, calm, comfortable Eyes: PERRL, lids and conjunctivae normal ENMT: Mucous membranes are moist.  Respiratory: clear to auscultation bilaterally, no wheezing, no crackles. Normal respiratory effort. No accessory muscle use.  Cardiovascular: Regular rate and rhythm, no murmurs / rubs / gallops. No extremity edema. 2+ pedal pulses.  Psychiatric: Normal judgment and insight. Alert and oriented x 3. Normal mood.    Impression and Plan:  Anaphylaxis, subsequent encounter  -Will prescribe a prednisone Dosepak today. -I do not believe she should get the second vaccine. -She will return to clinic if she has any further issues.    Patient Instructions  -Nice seeing you today!!  -Take prednisone taper for 6 days as directed.  -Return if any more issues for evaluation.       Lelon Frohlich, MD [facility]

## 2019-08-20 ENCOUNTER — Encounter: Payer: Self-pay | Admitting: Neurology

## 2019-08-20 ENCOUNTER — Ambulatory Visit (INDEPENDENT_AMBULATORY_CARE_PROVIDER_SITE_OTHER): Payer: No Typology Code available for payment source | Admitting: Neurology

## 2019-08-20 ENCOUNTER — Other Ambulatory Visit: Payer: Self-pay

## 2019-08-20 VITALS — BP 135/90 | HR 95 | Ht 62.5 in | Wt 148.0 lb

## 2019-08-20 DIAGNOSIS — R519 Headache, unspecified: Secondary | ICD-10-CM | POA: Diagnosis not present

## 2019-08-20 DIAGNOSIS — R4189 Other symptoms and signs involving cognitive functions and awareness: Secondary | ICD-10-CM

## 2019-08-20 DIAGNOSIS — R404 Transient alteration of awareness: Secondary | ICD-10-CM

## 2019-08-20 DIAGNOSIS — R292 Abnormal reflex: Secondary | ICD-10-CM | POA: Diagnosis not present

## 2019-08-20 DIAGNOSIS — R5383 Other fatigue: Secondary | ICD-10-CM | POA: Insufficient documentation

## 2019-08-20 NOTE — Progress Notes (Signed)
 NEUROLOGY CONSULTATION NOTE  Luvern G Whitefield MRN: [id number] DOB: 10/14/1980  Referring provider: Dr. Junell Koberlein Primary care provider: Dr. Junell Koberlein  Reason for consult:  Cognitive deficits   Dear Dr Koberlein:  Thank you for your kind referral of Lindsay Wong for consultation of the above symptoms. Although her history is well known to you, please allow me to reiterate it for the purpose of our medical record. Records and images were personally reviewed where available.   HISTORY OF PRESENT ILLNESS: This is a 39 year old right-handed woman with a history of diet-controlled hyperlipidemia, Reynaud's disease, anxiety and depression, presenting for evaluation of cognitive changes. She started having symptoms 2 years ago where she felt really tired, anxious or depressed for no reason. She thought symptoms were related to depression, she had brain fog so bad she could not think straight. She started having word-finding difficulties which frustrates her. These symptoms were episode, lasting for a couple of weeks, gradually getting better until she gets out of the fog and all the symptoms go away. She felt the same way on and off antidepressants. Symptoms were at the worst in February when they lasted for 3 weeks. She felt so foggy she had to think where she was going while driving, missing a turn, spacing out like she was on autopilot. She could not even watch TV. A neighbor told her she would not respond one time. She would have these episodes 2-3 times a year. She denies missing any medications. Her husband manages finances. She has to stand by the stove so she does not burn food. When she has the brain fog, sometimes she may have a migraine, having jaw tightness toward the end of the episode. Last February she had neck pain and had sensitivity to lights and sound. Migraines are not that often, but over the past year she has had 2, with associated vomiting 2 weeks ago. She states the  last couple of weeks have been great, she is not sure it is related to the prednisone she was prescribed for an allergic reaction after her Covid vaccine.   She had vertigo with double vision during some of the foggy episodes. Her neck pain is a lot better, she still has neck, shoulder, and back pain that comes and goes. Over the past couple of months, she has developed bloating, reflux, and change in stool caliber, taking Pepcid BID. She has occasional tinnitus. She has occasional metallic taste when she has the foggy episodes. She denies any focal numbness/tingling, there are times she feels stiff and weak. She has occasional twitching when resting. She was born 2.5 months premature. She had normal early development.  There is no history of febrile convulsions, CNS infections such as meningitis/encephalitis, significant traumatic brain injury, neurosurgical procedures, or family history of seizures. There is a family history of MS, lupus. Her maternal grandmother had strokes with memory problems.   Laboratory Data: ANA 1:640 ESR 22  PAST MEDICAL HISTORY: Past Medical History:  Diagnosis Date  . Allergy   . Anxiety and depression    per patient diagnosed by psychiatrist  . Appendicitis 07/20/2012  . Blood in stool   . Chicken pox   . Depression    postpartum with one child  . GERD 01/31/2007   Qualifier: Diagnosis of  By: Brand RMA, [name]    . Hyperlipidemia    postpartum  . PONV (postoperative nausea and vomiting)   . RAYNAUD'S DISEASE 01/31/2007   Qualifier: Diagnosis of    By: Brand RMA, [name]      PAST SURGICAL HISTORY: Past Surgical History:  Procedure Laterality Date  . CESAREAN SECTION    . LAPAROSCOPIC APPENDECTOMY Right 07/20/2012   Procedure: APPENDECTOMY LAPAROSCOPIC;  Surgeon: Todd M Gerkin, MD;  Location: [facility];  Service: General;  Laterality: Right;  . WISDOM TOOTH EXTRACTION      MEDICATIONS: Current Outpatient Medications on File Prior to Visit  Medication Sig  Dispense Refill  . betamethasone dipropionate 0.05 % cream Apply topically 2 (two) times daily. 30 g 0  . cetirizine (ZYRTEC) 10 MG tablet Take 10 mg by mouth daily.    . citalopram (CELEXA) 40 MG tablet Take 1 tablet (40 mg total) by mouth daily. 90 tablet 1  . Cyanocobalamin (B-12 PO) Take by mouth daily.    . Famotidine (PEPCID PO) Take by mouth.    . Ferrous Sulfate (IRON PO) Take by mouth 3 (three) times a week.    . ibuprofen (ADVIL) 200 MG tablet Take 200 mg by mouth as needed.    . VITAMIN D PO Take 2,000 Units by mouth.     No current facility-administered medications on file prior to visit.    ALLERGIES: Allergies  Allergen Reactions  . Clarithromycin Shortness Of Breath    REACTION: hives  . Peg-2000 (Covid-19 Mrna Vaccine Component) Anaphylaxis  . Latex     Skin irritation from gloves    FAMILY HISTORY: Family History  Problem Relation Age of Onset  . Early menopause Mother   . Other Father        no contact since infant  . Cancer Maternal Grandmother        melanoma  . Stroke Maternal Grandmother 69  . Arthritis Maternal Grandmother   . Hyperlipidemia Maternal Grandmother   . Hypertension Maternal Grandmother   . Breast cancer Maternal Grandmother 72  . Cancer Maternal Grandfather        bladder  . Lung cancer Other        all but one of grandmother's siblings   . Lupus Maternal Uncle   . Multiple sclerosis Cousin     SOCIAL HISTORY: Social History   Socioeconomic History  . Marital status: Married    Spouse name: Not on file  . Number of children: 3  . Years of education: Not on file  . Highest education level: Not on file  Occupational History  . Not on file  Tobacco Use  . Smoking status: Never Smoker  . Smokeless tobacco: Never Used  Substance and Sexual Activity  . Alcohol use: No  . Drug use: No  . Sexual activity: Yes    Birth control/protection: None    Comment: husband had vasectomy  Other Topics Concern  . 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 Difficulty of Paying Living Expenses:   Food Insecurity:   . Worried About Running Out of Food in the Last Year:   . Ran Out of Food in the Last Year:     Transportation Needs:   . Film/video editor (Medical):   Marland Kitchen Lack of Transportation (Non-Medical):   Physical Activity:   . Days of Exercise per Week:   . Minutes of Exercise per Session:   Stress:   . Feeling of Stress :   Social Connections:   . Frequency of Communication with Friends and Family:   . Frequency of Social Gatherings with Friends and Family:   . Attends Religious Services:   . Active Member of Clubs or Organizations:   . Attends Archivist Meetings:   Marland Kitchen Marital Status:   Intimate Partner Violence:   . Fear of Current or Ex-Partner:   . Emotionally Abused:   Marland Kitchen Physically Abused:   . Sexually Abused:     REVIEW OF SYSTEMS: Constitutional: No fevers, chills, or sweats, no generalized fatigue, change in appetite Eyes: No visual changes, double vision, eye pain Ear, nose and throat: No hearing loss, ear pain, nasal congestion, sore throat Cardiovascular: No chest pain, palpitations Respiratory:  No shortness of breath at rest or with exertion, wheezes GastrointestinaI: No nausea, vomiting, diarrhea, abdominal pain, fecal incontinence Genitourinary:  No dysuria, urinary retention or frequency Musculoskeletal:  + neck pain, back pain Integumentary: No rash, pruritus, skin lesions Neurological: as above Psychiatric: + depression, insomnia, anxiety Endocrine: No palpitations, fatigue, diaphoresis, mood swings, change in appetite, change in weight,  increased thirst Hematologic/Lymphatic:  No anemia, purpura, petechiae. Allergic/Immunologic: no itchy/runny eyes, nasal congestion, recent allergic reactions, rashes  PHYSICAL EXAM: Vitals:   08/20/19 0855  BP: 135/90  Pulse: 95  SpO2: 99%   General: No acute distress Head:  Normocephalic/atraumatic Skin/Extremities: No rash, no edema Neurological Exam: Mental status: alert and oriented to person, place, and time, no dysarthria or aphasia, Fund of knowledge is appropriate.  Recent memory impaired. Attention and concentration are reduced. SLUMS score 20/30 [employer] Mental Exam 08/20/2019  Weekday Correct 1  Current year 1  What state are we in? 1  Amount spent 0  Amount left 0  # of Animals 3  5 objects recall 2  Number series 2  Hour markers 2  Time correct 2  Placed X in triangle correctly 1  Largest Figure 1  Name of female 2  Date back to work 2  Type of work [employer] she lived in 0  Total score 20    Cranial nerves: CN I: not tested CN II: pupils equal, round and reactive to light, visual fields intact CN III, IV, VI:  full range of motion, no nystagmus, no ptosis CN V: facial sensation intact CN VII: upper and lower face symmetric CN VIII: hearing intact to conversation Bulk & Tone: normal, no fasciculations. Motor: 5/5 throughout with no pronator drift. Sensation: intact to light touch, cold, pin, vibration and joint position sense.  No extinction to double simultaneous stimulation.  Romberg test negative Deep Tendon Reflexes: brisk +2 throughout, no ankle clonus, Hoffman sign on right Plantar responses: downgoing bilaterally Cerebellar: no incoordination on finger to nose testing Gait: narrow-based and steady, able to tandem walk adequately. Tremor: none   IMPRESSION: This is a 39 year old right-handed woman with a history of diet-controlled hyperlipidemia, Reynaud's disease, anxiety and depression, presenting for evaluation of cognitive changes.  Symptoms are episodic, lasting weeks at a time. Her neurological exam shows hyperreflexia, no focal findings. SLUMS score 20/30. Etiology of symptoms unclear, MRI brain with and without contrast and MRI cervical spine with and without contrast will be ordered to assess  for underlying structural abnormality. She reports episodes of gaps in time and being told she is not responsive, 1-hour EEG will be ordered. ANA 1:640, agree with Rheumatology evaluation to assess for potential underlying autoimmune process. Follow-up after tests, she knows to call for any changes.   Thank you for allowing me to participate in the care of this patient. Please do not hesitate to call for any questions or concerns.   Ellouise Newer, M.D.  CC: Dr. Ethlyn Gallery

## 2019-08-20 NOTE — Patient Instructions (Addendum)
1. Schedule MRI brain with and without contrast  2. Schedule MRI cervical spine with and without contrast  We have sent a referral to [facility] for your MRI and they will call you directly to schedule your appointment. They are located at [address] [facility]. If you need to contact them directly please call [phone].   3. Schedule 1-hour EEG  4. Proceed with Rheumatology evaluation  5. Follow-up after tests, call for any changes

## 2019-08-23 ENCOUNTER — Other Ambulatory Visit: Payer: Self-pay

## 2019-08-23 ENCOUNTER — Ambulatory Visit (INDEPENDENT_AMBULATORY_CARE_PROVIDER_SITE_OTHER): Payer: No Typology Code available for payment source | Admitting: Neurology

## 2019-08-23 DIAGNOSIS — R4189 Other symptoms and signs involving cognitive functions and awareness: Secondary | ICD-10-CM | POA: Diagnosis not present

## 2019-08-23 DIAGNOSIS — R519 Headache, unspecified: Secondary | ICD-10-CM

## 2019-08-23 DIAGNOSIS — R292 Abnormal reflex: Secondary | ICD-10-CM | POA: Diagnosis not present

## 2019-08-23 DIAGNOSIS — R404 Transient alteration of awareness: Secondary | ICD-10-CM

## 2019-08-23 NOTE — Procedures (Signed)
ELECTROENCEPHALOGRAM REPORT  Date of Study: 08/23/2019  Patient's Name: Lindsay Wong MRN: [id number] Date of Birth: 1980-08-27  Referring Provider: Dr. Patrcia Dolly  Clinical History: This is a 39 year old woman with episodes of brain fog   Medications: betamethasone dipropionate 0.05 % cream ZYRTEC 10 MG tablet CELEXA 40 MG tablet Cyanocobalamin (B-12 PO PEPCID PO IRON PO ADVIL 200 MG tablet VITAMIN D PO   Technical Summary: A multichannel digital 1-hour EEG recording measured by the international 10-20 system with electrodes applied with paste and impedances below 5000 ohms performed in our laboratory with EKG monitoring in an awake and drowsy patient.  Hyperventilation was not performed. Photic stimulation was performed.  The digital EEG was referentially recorded, reformatted, and digitally filtered in a variety of bipolar and referential montages for optimal display.    Description: The patient is awake and drowsy during the recording.  During maximal wakefulness, there is a symmetric, medium voltage 10 Hz posterior dominant rhythm that attenuates with eye opening.  There is muscle artifact obscuring the frontal lead throughout most of the study, in between artifact, the record is symmetric.  During drowsiness, there is an increase in theta slowing of the background. Sleep was not captured. Photic stimulation did not elicit any abnormalities.  There were no epileptiform discharges or electrographic seizures seen.    EKG lead was unremarkable.  Impression: This 1-hour awake and drowsy EEG is normal. Study is slightly limited due to muscle artifact over the frontal leads throughout most of the recording.  Clinical Correlation: A normal EEG does not exclude a clinical diagnosis of epilepsy.  If further clinical questions remain, prolonged EEG may be helpful.  Clinical correlation is advised.   Patrcia Dolly, M.D.

## 2019-08-23 NOTE — Progress Notes (Signed)
Authorization number [id number] [place] window 08/20/19-09/19/19 [employer]  ID [id number] Tax id [id number]  Mri bran with and with out  MRI neck spine with and with out  Approval letter sent to scan

## 2019-08-30 ENCOUNTER — Other Ambulatory Visit: Payer: Self-pay | Admitting: Physician Assistant

## 2019-08-30 MED FILL — BETAMETHASONE DP 0.05% CRM: 0.05 | 30 days supply | Qty: 45 | Fill #0

## 2019-09-01 ENCOUNTER — Encounter: Payer: Self-pay | Admitting: Family Medicine

## 2019-09-07 MED FILL — CITALOPRAM HBR 40 MG TABLET: 40 | 30 days supply | Qty: 30 | Fill #0

## 2019-09-07 MED FILL — hydrOXYzine HCL 25 MG TABS: 25 | 30 days supply | Qty: 60 | Fill #0

## 2019-09-08 ENCOUNTER — Encounter: Payer: Self-pay | Admitting: Family Medicine

## 2019-09-08 DIAGNOSIS — T782XXD Anaphylactic shock, unspecified, subsequent encounter: Secondary | ICD-10-CM

## 2019-09-08 MED ORDER — ESOMEPRAZOLE MAGNESIUM 40 MG PO CPDR: 40.0000 mg | DELAYED_RELEASE_CAPSULE | Freq: Every day | ORAL | 3 refills | Status: DC

## 2019-09-08 MED FILL — ESOMEPRAZOLE MAG DR 40 MG C: 40 | 30 days supply | Qty: 30 | Fill #0

## 2019-09-08 NOTE — Addendum Note (Signed)
Addended by: Wynn Banker on: 09/08/2019 04:32 PM   Modules accepted: Orders

## 2019-09-09 ENCOUNTER — Other Ambulatory Visit (HOSPITAL_COMMUNITY): Payer: Self-pay | Admitting: Physician Assistant

## 2019-09-09 MED FILL — LEVOCETIRIZINE 5 MG TABLET: 5 | 30 days supply | Qty: 30 | Fill #0

## 2019-09-18 ENCOUNTER — Ambulatory Visit
Admission: RE | Admit: 2019-09-18 | Discharge: 2019-09-18 | Disposition: A | Discharge status: Home / Self Care | Payer: No Typology Code available for payment source | Source: Ambulatory Visit | Attending: Neurology | Admitting: Neurology

## 2019-09-18 ENCOUNTER — Other Ambulatory Visit: Payer: Self-pay

## 2019-09-18 DIAGNOSIS — R4189 Other symptoms and signs involving cognitive functions and awareness: Secondary | ICD-10-CM

## 2019-09-18 DIAGNOSIS — R519 Headache, unspecified: Secondary | ICD-10-CM

## 2019-09-18 DIAGNOSIS — R404 Transient alteration of awareness: Secondary | ICD-10-CM

## 2019-09-18 DIAGNOSIS — R292 Abnormal reflex: Secondary | ICD-10-CM

## 2019-09-18 MED ORDER — GADOBENATE DIMEGLUMINE 529 MG/ML IV SOLN
14.0000 mL | Freq: Once | INTRAVENOUS | Status: AC | PRN
  Administered 2019-09-18: 14 mL via INTRAVENOUS

## 2019-09-22 ENCOUNTER — Other Ambulatory Visit: Payer: Self-pay

## 2019-09-22 DIAGNOSIS — M4800 Spinal stenosis, site unspecified: Secondary | ICD-10-CM

## 2019-10-05 ENCOUNTER — Ambulatory Visit: Payer: No Typology Code available for payment source | Admitting: Orthopaedic Surgery

## 2019-10-05 MED FILL — ESOMEPRAZOLE MAG DR 40 MG C: 40 | 30 days supply | Qty: 30 | Fill #1

## 2019-10-14 MED FILL — LEVOCETIRIZINE 5 MG TABLET: 5 | 30 days supply | Qty: 30 | Fill #1

## 2019-10-18 NOTE — Progress Notes (Addendum)
Office Visit Note  Patient: Lindsay Wong             Date of Birth: [birth year]           MRN: [id number]             PCP: Caren Macadam, MD Referring: Caren Macadam, MD Visit Date: 11/01/2019 Occupation: @GUAROCC @  Subjective:  Raynaud's, positive ANA.   History of Present Illness: Lindsay Wong is a 39 y.o. female seen in consultation per request of her PCP.  According to the patient her symptoms a started with Raynaud's phenomenon while she was in the high school.  She developed digital ulcer on her toe while she was in the hospital.  She states the Raynaud's is worse in her feet compared to her hands.  She had preeclampsia with each pregnancy.  She had total of 3 pregnancies.  She had premature labor at 35, 35 and 36 weeks respectively.  She has been experiencing increased fatigue and depression since 2010.  She states she has episodic increased fatigue.  She states in the last 1 year she has been experiencing increased generalized achiness.  The pain is in her muscles and joints.  She describes pain mostly in her hips and her knee joints.  She has not noticed any joint swelling.  She states she is very stiff in the morning although she walks about 2 miles every day.  She saw a rheumatologist while in college.  At the time she was placed on amlodipine.  She stopped the medication as Raynaud's improved.  She has been experiencing hives for the last 6 weeks.  The last episode was 2 weeks ago.  She was seen by dermatologist who did a biopsy and the results were consistent with perivascular urticaria.  She had an anaphylactic reaction to COVID-19 vaccine in March 2021.  There is questionable history of autoimmune disease in her maternal uncle.  Activities of Daily Living:  Patient reports morning stiffness for  45 minutes.   Patient Reports nocturnal pain.  Difficulty dressing/grooming: Denies Difficulty climbing stairs: Reports Difficulty getting out of chair: Denies Difficulty  using hands for taps, buttons, cutlery, and/or writing: Denies  Review of Systems  Constitutional: Positive for fatigue. Negative for night sweats, weight gain and weight loss.  HENT: Positive for mouth sores. Negative for trouble swallowing, trouble swallowing, mouth dryness and nose dryness.        History of nasal ulcers  Eyes: Negative for pain, redness, itching, visual disturbance and dryness.  Respiratory: Negative for cough, shortness of breath and difficulty breathing.   Cardiovascular: Positive for palpitations. Negative for chest pain, hypertension, irregular heartbeat and swelling in legs/feet.  Gastrointestinal: Negative for blood in stool, constipation and diarrhea.  Endocrine: Negative for increased urination.  Genitourinary: Positive for nocturia. Negative for difficulty urinating and vaginal dryness.  Musculoskeletal: Positive for arthralgias, joint pain, myalgias, morning stiffness, muscle tenderness and myalgias. Negative for joint swelling and muscle weakness.  Skin: Positive for color change, rash and sensitivity to sunlight. Negative for hair loss, skin tightness and ulcers.  Allergic/Immunologic: Negative for susceptible to infections.  Neurological: Positive for headaches. Negative for dizziness, light-headedness, numbness, memory loss, night sweats and weakness.       History of migraine  Hematological: Negative for bruising/bleeding tendency and swollen glands.  Psychiatric/Behavioral: Positive for depressed mood. Negative for confusion and sleep disturbance. The patient is nervous/anxious.     PMFS History:  Patient Active Problem List  Diagnosis Date Noted  . Fatigue 08/20/2019  . Anxiety and depression 05/19/2017  . Left shoulder pain 07/19/2014  . RAYNAUD'S DISEASE 01/31/2007    Past Medical History:  Diagnosis Date  . Allergy   . Anxiety and depression    per patient diagnosed by psychiatrist  . Appendicitis 07/20/2012  . Blood in stool   . Chicken  pox   . Depression    postpartum with one child  . GERD 01/31/2007   Qualifier: Diagnosis of  By: Marca Ancona RMA, [name]    . Hyperlipidemia    postpartum  . PONV (postoperative nausea and vomiting)   . RAYNAUD'S DISEASE 01/31/2007   Qualifier: Diagnosis of  By: Larose Kells      Family History  Problem Relation Age of Onset  . Early menopause Mother   . Other Father        no contact since infant  . Raynaud syndrome Sister   . Cancer Maternal Grandmother        melanoma  . Stroke Maternal Grandmother 69  . Arthritis Maternal Grandmother   . Hyperlipidemia Maternal Grandmother   . Hypertension Maternal Grandmother   . Breast cancer Maternal Grandmother 72  . Cancer Maternal Grandfather        bladder  . Lung cancer Other        all but one of grandmother's siblings   . Lupus Maternal Uncle   . Multiple sclerosis Cousin   . Autism Son   . Healthy Son   . Healthy Son   . Healthy Son    Past Surgical History:  Procedure Laterality Date  . CESAREAN SECTION     x3  . LAPAROSCOPIC APPENDECTOMY Right 07/20/2012   Procedure: APPENDECTOMY LAPAROSCOPIC;  Surgeon: Earnstine Regal, MD;  Location: [facility];  Service: General;  Laterality: Right;  . 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 Influenza Split 01/04/2011  . Influenza Whole 04/07/2006, 02/01/2008  . Influenza,inj,Quad PF,6+ Mos 01/14/2014, 02/08/2015, 02/20/2016, 02/12/2017, 02/13/2018, 03/01/2019  . Influenza-Unspecified 01/11/2013, 02/11/2016  . Tdap 03/01/2019     Objective: Vital Signs: BP (!) 141/97 (BP Location: Left Arm, Patient Position: Sitting, Cuff  Size: Normal)   Pulse 79   Resp 14   Ht 5' 2.75" (1.594 m)   Wt 149 lb 3.2 oz (67.7 kg)   BMI 26.64 kg/m    Physical Exam Vitals and nursing note reviewed.  Constitutional:      Appearance: She is well-developed.  HENT:     Head: Normocephalic and atraumatic.  Eyes:     Conjunctiva/sclera: Conjunctivae normal.  Cardiovascular:     Rate and Rhythm: Normal rate and regular rhythm.     Heart sounds: Normal heart sounds.  Pulmonary:     Effort: Pulmonary effort is normal.     Breath sounds: Normal breath sounds.  Abdominal:     General: Bowel sounds are normal.     Palpations: Abdomen is soft.  Musculoskeletal:  Cervical back: Normal range of motion.  Lymphadenopathy:     Cervical: No cervical adenopathy.  Skin:    General: Skin is warm and dry.     Capillary Refill: Capillary refill takes less than 2 seconds.  Neurological:     Mental Status: She is alert and oriented to person, place, and time.  Psychiatric:        Behavior: Behavior normal.      Musculoskeletal Exam: C-spine thoracic and lumbar spine with good range of motion.  Shoulder joints, elbow joints, wrist joints, MCPs PIPs and DIPs with good range of motion with no synovitis.  Hip joints, knee joints, ankles, MTPs and PIPs with good range of motion with no synovitis.  CDAI Exam: CDAI Score: -- Patient Global: --; Provider Global: -- Swollen: --; Tender: -- Joint Exam 11/01/2019   No joint exam has been documented for this visit   There is currently no information documented on the homunculus. Go to the Rheumatology activity and complete the homunculus joint exam.  Investigation: No additional findings.  Imaging: No results found.  Recent Labs: Lab Results  Component Value Date   WBC 7.6 07/27/2019   HGB 13.7 07/27/2019   PLT 292.0 07/27/2019   NA 137 07/17/2018   K 4.4 07/17/2018   CL 101 07/17/2018   CO2 29 07/17/2018   GLUCOSE 90 07/17/2018   BUN 10 07/17/2018   CREATININE 0.92  07/17/2018   BILITOT 0.7 07/17/2018   ALKPHOS 89 07/17/2018   AST 13 07/17/2018   ALT 13 07/17/2018   PROT 7.4 07/17/2018   ALBUMIN 4.5 07/17/2018   CALCIUM 9.8 07/17/2018   GFRAA >90 07/20/2012    Speciality Comments: No specialty comments available.  Procedures:  No procedures performed Allergies: Clarithromycin, Peg-2000 (covid-19 mrna vaccine component), and Latex   Assessment / Plan:     Visit Diagnoses: Positive ANA (antinuclear antibody) - 07/27/19: ANA 1:640NS, CRP<1, RF<14, ESR 22 -patient has history of severe Raynaud's since she was in high school.  She also gives history of arthralgia for many years.  She gives history of photosensitivity, oral ulcers and recurrent hives.  She has been seen by allergist and also dermatologist for recurrent hives for the last 6 months.  There is history of preeclampsia with all her 3 pregnancies.  She gives history of significant fatigue.  She developed a digital ulcer when she was in hospital.  I will obtain following labs today.  Plan: CBC with Differential/Platelet, COMPLETE METABOLIC PANEL WITH GFR, Urinalysis, Routine w reflex microscopic, CK, TSH, Cyclic citrul peptide antibody, IgG, Anti-scleroderma antibody, RNP Antibody, Anti-Smith antibody, Sjogrens syndrome-A extractable nuclear antibody, Sjogrens syndrome-B extractable nuclear antibody, Anti-DNA antibody, double-stranded, C3 and C4, Beta-2 glycoprotein antibodies, Cardiolipin antibodies, IgG, IgM, IgA, Lupus Anticoagulant Eval w/Reflex, Glucose 6 phosphate dehydrogenase  Other fatigue-she has been experiencing significant fatigue.  Raynaud's syndrome without gangrene -she has decreased capillary refill.  History of digital ulcer .  She was given amlodipine in the past which was helpful.  I will call in a prescription for amlodipine 5 mg p.o. daily.  Side effects were reviewed.  The blood pressure is also slightly elevated it will help.  Plan: Cryoglobulin, Pan-ANCA  Hives-patient gives  history of hives for the last 6 months.  She has been followed by allergist and dermatologist.  Pain in both hands -he complains of pain in bilateral hands.  No synovitis was noted.  Plan: XR Foot 2 Views Right, XR Foot 2 Views Left.  X-ray bilateral  feet were unremarkable.  X-ray findings were discussed with the patient.  Chronic pain of both hips -she complains of pain in her bilateral hips.  Plan: XR HIPS BILAT W OR W/O PELVIS 3-4 VIEWS x-ray bilateral hip joints were unremarkable.  X-ray findings were discussed with the patient.  Pain in both feet -discomfort in her bilateral feet plan: XR Hand 2 View Right, XR Hand 2 View Left.  X-ray of bilateral hands were unremarkable.  X-ray findings were discussed with the patient.  Chronic pain of both knees -she complains of discomfort in bilateral knee joints but no synovitis was noted.  Plan: XR KNEE 3 VIEW RIGHT, XR KNEE 3 VIEW LEFT.  X-ray showed bilateral moderate osteoarthritis and moderate chondromalacia patella.  X-ray findings were discussed with the patient.  Pre-eclampsia in third trimester-she had 3 pregnancies which all resulted in preterm labor and preeclampsia.  Anxiety and depression-she gives history of anxiety and depression for the last 10 years.  History of esophageal reflux  History of migraine-followed by neurologist.  Family history of multiple sclerosis - Maternal uncle, first cousin  Orders: Orders Placed This Encounter  Procedures  . XR KNEE 3 VIEW RIGHT  . XR KNEE 3 VIEW LEFT  . XR HIPS BILAT W OR W/O PELVIS 3-4 VIEWS  . XR Foot 2 Views Right  . XR Foot 2 Views Left  . XR Hand 2 View Right  . XR Hand 2 View Left  . CBC with Differential/Platelet  . COMPLETE METABOLIC PANEL WITH GFR  . Urinalysis, Routine w reflex microscopic  . CK  . TSH  . Cyclic citrul peptide antibody, IgG  . Anti-scleroderma antibody  . RNP Antibody  . Anti-Smith antibody  . Sjogrens syndrome-A extractable nuclear antibody  . Sjogrens  syndrome-B extractable nuclear antibody  . Anti-DNA antibody, double-stranded  . C3 and C4  . Beta-2 glycoprotein antibodies  . Cardiolipin antibodies, IgG, IgM, IgA  . Lupus Anticoagulant Eval w/Reflex  . Glucose 6 phosphate dehydrogenase  . Cryoglobulin  . Pan-ANCA   Meds ordered this encounter  Medications  . amLODipine (NORVASC) 5 MG tablet    Sig: Take 1 tablet (5 mg total) by mouth daily.    Dispense:  30 tablet    Refill:  3    Face-to-face time spent with patient was 50 minutes. Greater than 50% of time was spent in counseling and coordination of care.  Follow-Up Instructions: Return in about 2 weeks (around 11/15/2019) for +ANA.   Bo Merino, MD  Note - This record has been created using Editor, commissioning.  Chart creation errors have been sought, but may not always  have been located. Such creation errors do not reflect on  the standard of medical care.

## 2019-11-01 ENCOUNTER — Ambulatory Visit: Payer: Self-pay

## 2019-11-01 ENCOUNTER — Other Ambulatory Visit: Payer: Self-pay

## 2019-11-01 ENCOUNTER — Ambulatory Visit: Payer: No Typology Code available for payment source | Admitting: Rheumatology

## 2019-11-01 ENCOUNTER — Encounter: Payer: Self-pay | Admitting: Rheumatology

## 2019-11-01 ENCOUNTER — Other Ambulatory Visit: Payer: Self-pay | Admitting: Rheumatology

## 2019-11-01 VITALS — BP 141/97 | HR 79 | Resp 14 | Ht 62.75 in | Wt 149.2 lb

## 2019-11-01 DIAGNOSIS — M25562 Pain in left knee: Secondary | ICD-10-CM | POA: Diagnosis not present

## 2019-11-01 DIAGNOSIS — I7300 Raynaud's syndrome without gangrene: Secondary | ICD-10-CM | POA: Diagnosis not present

## 2019-11-01 DIAGNOSIS — R5383 Other fatigue: Secondary | ICD-10-CM

## 2019-11-01 DIAGNOSIS — M79672 Pain in left foot: Secondary | ICD-10-CM

## 2019-11-01 DIAGNOSIS — O1493 Unspecified pre-eclampsia, third trimester: Secondary | ICD-10-CM

## 2019-11-01 DIAGNOSIS — M25552 Pain in left hip: Secondary | ICD-10-CM

## 2019-11-01 DIAGNOSIS — M25551 Pain in right hip: Secondary | ICD-10-CM | POA: Diagnosis not present

## 2019-11-01 DIAGNOSIS — M79671 Pain in right foot: Secondary | ICD-10-CM

## 2019-11-01 DIAGNOSIS — M79642 Pain in left hand: Secondary | ICD-10-CM | POA: Diagnosis not present

## 2019-11-01 DIAGNOSIS — F329 Major depressive disorder, single episode, unspecified: Secondary | ICD-10-CM

## 2019-11-01 DIAGNOSIS — M79641 Pain in right hand: Secondary | ICD-10-CM

## 2019-11-01 DIAGNOSIS — F419 Anxiety disorder, unspecified: Secondary | ICD-10-CM

## 2019-11-01 DIAGNOSIS — R768 Other specified abnormal immunological findings in serum: Secondary | ICD-10-CM

## 2019-11-01 DIAGNOSIS — Z820 Family history of epilepsy and other diseases of the nervous system: Secondary | ICD-10-CM

## 2019-11-01 DIAGNOSIS — Z8669 Personal history of other diseases of the nervous system and sense organs: Secondary | ICD-10-CM

## 2019-11-01 DIAGNOSIS — L509 Urticaria, unspecified: Secondary | ICD-10-CM

## 2019-11-01 DIAGNOSIS — F32A Depression, unspecified: Secondary | ICD-10-CM

## 2019-11-01 DIAGNOSIS — Z8719 Personal history of other diseases of the digestive system: Secondary | ICD-10-CM

## 2019-11-01 DIAGNOSIS — G8929 Other chronic pain: Secondary | ICD-10-CM

## 2019-11-01 DIAGNOSIS — M25561 Pain in right knee: Secondary | ICD-10-CM | POA: Diagnosis not present

## 2019-11-01 MED ORDER — AMLODIPINE BESYLATE 5 MG PO TABS: 5.0000 mg | ORAL_TABLET | Freq: Every day | ORAL | 3 refills | Status: DC

## 2019-11-01 MED FILL — AMLODIPINE BESYLATE 5 MG TA: 5 | 30 days supply | Qty: 30 | Fill #0

## 2019-11-02 NOTE — Progress Notes (Signed)
I will discuss results at the follow-up visit.

## 2019-11-03 ENCOUNTER — Encounter: Payer: Self-pay | Admitting: Allergy

## 2019-11-03 ENCOUNTER — Other Ambulatory Visit: Payer: Self-pay | Admitting: Allergy

## 2019-11-03 ENCOUNTER — Ambulatory Visit: Payer: No Typology Code available for payment source | Admitting: Allergy

## 2019-11-03 ENCOUNTER — Other Ambulatory Visit: Payer: Self-pay

## 2019-11-03 VITALS — BP 130/90 | HR 69 | Temp 97.8°F | Resp 16 | Ht 62.5 in | Wt 150.4 lb

## 2019-11-03 DIAGNOSIS — L508 Other urticaria: Secondary | ICD-10-CM

## 2019-11-03 DIAGNOSIS — J4520 Mild intermittent asthma, uncomplicated: Secondary | ICD-10-CM

## 2019-11-03 DIAGNOSIS — J310 Chronic rhinitis: Secondary | ICD-10-CM

## 2019-11-03 MED ORDER — MONTELUKAST SODIUM 10 MG PO TABS
10.0000 mg | ORAL_TABLET | Freq: Every day | ORAL | 5 refills | Status: DC
Start: 2019-11-03 — End: 2020-02-25

## 2019-11-03 MED ORDER — ALBUTEROL SULFATE HFA 108 (90 BASE) MCG/ACT IN AERS
2.0000 | INHALATION_SPRAY | RESPIRATORY_TRACT | 1 refills | Status: DC | PRN
Start: 2019-11-03 — End: 2020-07-03

## 2019-11-03 MED ORDER — LEVOCETIRIZINE DIHYDROCHLORIDE 5 MG PO TABS
5.0000 mg | ORAL_TABLET | Freq: Two times a day (BID) | ORAL | 5 refills | Status: DC | PRN
Start: 2019-11-03 — End: 2020-06-19

## 2019-11-03 MED ORDER — FAMOTIDINE 20 MG PO TABS
20.0000 mg | ORAL_TABLET | Freq: Two times a day (BID) | ORAL | 5 refills | Status: DC
Start: 2019-11-03 — End: 2020-02-25

## 2019-11-03 MED FILL — ALBUTEROL SULFATE HFA 108 (: 108 (90 BAS | 16 days supply | Qty: 18 | Fill #0

## 2019-11-03 MED FILL — FAMOTIDINE 20 MG TABS: 20 | 30 days supply | Qty: 60 | Fill #0

## 2019-11-03 MED FILL — MONTELUKAST SOD 10 MG TAB: 10 | 30 days supply | Qty: 30 | Fill #0

## 2019-11-03 NOTE — Progress Notes (Signed)
New Patient Note  RE: Lindsay Wong MRN: [id number] DOB: 1980/10/04 Date of Office Visit: 11/03/2019  Referring provider: Wynn Banker, MD Primary care provider: Wynn Banker, MD  Chief Complaint: hives  History of present illness: Lindsay Wong is a 39 y.o. female presenting today for consultation for urticaria.   For several years she states she has had throat and ear itching as well as fluid in her ears and sinus issues.  She also reports several episodes of wheeze and shortness of breath.  She also reports dizziness.  She states these symptoms do feel worse in the spring.  She is taking zyrtec daily year-round that helps some.  Not used any nose sprays.  She does not have any inhalers.  She states she has used her husband's albuterol inhaler on the occasion she has had wheeze which does help relieve symptoms.   She also states she has been having a hive like rash that has been ongoing for past 6 weeks.  She states they pop up and go away after a couple hours and pops up in different places.  For the past 3 weeks the hives have been worse and is very itchy.  She reports hives have left bruising. She does feel her joint pain is worse with this rash.  No true fevers with the rash.  She has been taking zyrtec in AM and xyzal in PM.  She states she still has to take benadryl in between that.  She does states the sun seems to make itch worse. She does report sweating can cause itch to be worse.  no significant symptoms at pressure points.  She saw a dermatologist back in March for reports of "sores" on her skin that are different than the hives.  She states she had sores that would not go away.  She is using betamethosone cream that is helping.  She did have a biopsy that was found to be perivascular urticaria.   She states she did have hives once before with swelling in March 2021.    No history of asthma diagnosis or food allergy.  She does follow with rheumatologist  for history of Raynauds as well as "high ANA".  She also reports having joint pains and stiffness, fatigue as well.  she is undergoing a work-up currently.    She did have a reported anaphylactic reaction to Covid-19 mRNA vaccine in March 2021.   Review of systems: Review of Systems  Constitutional: Positive for malaise/fatigue.  HENT: Positive for congestion.   Eyes: Negative.   Respiratory: Negative.   Cardiovascular: Negative.   Gastrointestinal: Negative.   Musculoskeletal: Joint pain.   Skin: Positive for itching and rash.  Neurological: Negative.   All other systems negative unless noted above in HPI  Past medical history: Past Medical History:  Diagnosis Date  . Allergy   . Anxiety and depression    per patient diagnosed by psychiatrist  . Appendicitis 07/20/2012  . Blood in stool   . Chicken pox   . Depression    postpartum with one child  . GERD 01/31/2007   Qualifier: Diagnosis of  By: Charlsie Quest RMA, [name]    . Hyperlipidemia    postpartum  . PONV (postoperative nausea and vomiting)   . RAYNAUD'S DISEASE 01/31/2007   Qualifier: Diagnosis of  By: Samara Snide      Past surgical history: Past Surgical History:  Procedure Laterality Date  . CESAREAN SECTION     x3  .  LAPAROSCOPIC APPENDECTOMY Right 07/20/2012   Procedure: APPENDECTOMY LAPAROSCOPIC;  Surgeon: Velora Heckler, MD;  Location: [facility];  Service: General;  Laterality: Right;  . WISDOM TOOTH EXTRACTION      Family history:  Family History  Problem Relation Age of Onset  . Early menopause Mother   . Other Father        no contact since infant  . Raynaud syndrome Sister   . Cancer Maternal Grandmother        melanoma  . Stroke Maternal Grandmother 3  . Arthritis Maternal Grandmother   . Hyperlipidemia Maternal Grandmother   . Hypertension Maternal Grandmother   . Breast cancer Maternal Grandmother 72  . Cancer Maternal Grandfather        bladder  . Lung cancer Other        all but one of  grandmother's siblings   . Lupus Maternal Uncle   . Multiple sclerosis Cousin   . Autism Son   . Healthy Son   . Healthy Son   . Healthy Son     Social history: Lives in a home with carpeting in the bedroom with electric heating and central cooling.  1 dog and 4 cats in the home.  No concern for water damage, mildew or roaches in the home.  She is a Futures trader.  No smoking history.    Medication List: Current Outpatient Medications  Medication Sig Dispense Refill  . amLODipine (NORVASC) 5 MG tablet Take 1 tablet (5 mg total) by mouth daily. 30 tablet 3  . cetirizine (ZYRTEC) 10 MG tablet Take 10 mg by mouth daily.    . citalopram (CELEXA) 40 MG tablet Take 1 tablet (40 mg total) by mouth daily. (Patient taking differently: Take 20 mg by mouth daily. ) 90 tablet 1  . Cyanocobalamin (B-12 PO) Take by mouth daily.    Marland Kitchen esomeprazole (NEXIUM) 40 MG capsule Take 1 capsule (40 mg total) by mouth daily. 30 capsule 3  . Ferrous Sulfate (IRON PO) Take by mouth 3 (three) times a week.    . levocetirizine (XYZAL) 5 MG tablet SMARTSIG:1 Tablet(s) By Mouth Every Evening PRN    . naproxen sodium (ALEVE) 220 MG tablet Take 220 mg by mouth as needed.    Marland Kitchen VITAMIN D PO Take 2,000 Units by mouth.    Marland Kitchen albuterol (VENTOLIN HFA) 108 (90 Base) MCG/ACT inhaler Inhale 2 puffs into the lungs every 4 (four) hours as needed for wheezing or shortness of breath. 18 g 1  . famotidine (PEPCID) 20 MG tablet Take 1 tablet (20 mg total) by mouth 2 (two) times daily. 60 tablet 5  . levocetirizine (XYZAL) 5 MG tablet Take 1 tablet (5 mg total) by mouth 2 (two) times daily as needed for allergies. 60 tablet 5  . montelukast (SINGULAIR) 10 MG tablet Take 1 tablet (10 mg total) by mouth at bedtime. 30 tablet 5   No current facility-administered medications for this visit.    Known medication allergies: Allergies  Allergen Reactions  . Clarithromycin Shortness Of Breath    REACTION: hives  . Peg-2000 (Covid-19 Mrna  Vaccine Component) Anaphylaxis  . Latex     Skin irritation from gloves     Physical examination: Blood pressure 130/90, pulse 69, temperature 97.8 F (36.6 C), temperature source Temporal, resp. rate 16, height 5' 2.5" (1.588 m), weight 150 lb 6.4 oz (68.2 kg), SpO2 100 %.  General: Alert, interactive, in no acute distress. HEENT: PERRLA, TMs pearly gray, turbinates moderately  edematous with clear discharge, post-pharynx non erythematous. Neck:  Supple without lymphadenopathy. Lungs:           Clear to auscultation without wheezing, rhonchi or rales. {no increased work of breathing. CV:     Normal S1, S2 without murmurs. Abdomen: Nondistended, nontender. Skin:   several faint erythematous macules on arms.  Rt forearm with 3 dark maculs and several healed scars. Extremities:  No clubbing, cyanosis or edema. Neuro:   Grossly intact.   Diagnositics/Labs: Labs:  Component     Latest Ref Rng & Units 11/01/2019  WBC     3.8 - 10.8 Thousand/uL 6.7  RBC     3.80 - 5.10 Million/uL 4.53  Hemoglobin     11.7 - 15.5 g/dL 13.3  HCT     35 - 45 % 39.4  MCV     80.0 - 100.0 fL 87.0  MCH     27.0 - 33.0 pg 29.4  MCHC     32.0 - 36.0 g/dL 33.8  RDW     11.0 - 15.0 % 12.8  Platelets     140 - 400 Thousand/uL 306  MPV     7.5 - 12.5 fL 11.2  NEUT#     1,500 - 7,800 cells/uL 4,134  Lymphocyte #     850 - 3,900 cells/uL 2,017  Absolute Monocytes     200 - 950 cells/uL 422  Eosinophils Absolute     15 - 500 cells/uL 87  Basophils Absolute     0 - 200 cells/uL 40  Neutrophils     % 61.7  Total Lymphocyte     % 30.1  Monocytes Relative     % 6.3  Eosinophil     % 1.3  Basophil     % 0.6  Glucose     65 - 99 mg/dL 91  BUN     7 - 25 mg/dL 6 (L)  Creatinine     0.50 - 1.10 mg/dL 0.89  GFR, Est Non African American     > OR = 60 mL/min/1.23m2 81  GFR, Est African American     > OR = 60 mL/min/1.34m2 93  BUN/Creatinine Ratio     6 - 22 (calc) 7  Sodium     135 - 146  mmol/L 138  Potassium     3.5 - 5.3 mmol/L 4.0  Chloride     98 - 110 mmol/L 102  CO2     20 - 32 mmol/L 27  Calcium     8.6 - 10.2 mg/dL 10.2  Total Protein     6.1 - 8.1 g/dL 7.5  Albumin MSPROF     3.6 - 5.1 g/dL 4.6  Globulin     1.9 - 3.7 g/dL (calc) 2.9  AG Ratio     1.0 - 2.5 (calc) 1.6  Total Bilirubin     0.2 - 1.2 mg/dL 0.8  Alkaline phosphatase (APISO)     31 - 125 U/L 89  AST     10 - 30 U/L 15  ALT     6 - 29 U/L 15  Color, Urine     YELLOW DARK YELLOW  Appearance     CLEAR CLOUDY (A)  Specific Gravity, Urine     1.001 - 1.03 1.021  pH     5.0 - 8.0 6.5  Glucose, UA     NEGATIVE NEGATIVE  Bilirubin Urine     NEGATIVE NEGATIVE  Ketones, ur  NEGATIVE TRACE (A)  Hgb urine dipstick     NEGATIVE NEGATIVE  Protein     NEGATIVE NEGATIVE  Nitrite     NEGATIVE NEGATIVE  Leukocytes,Ua     NEGATIVE NEGATIVE  Beta-2 Glycoprotein I Ab, IgG     U/mL <2.0  Beta-2 Glyco 1 IgM     U/mL 7.1  Beta-2 Glyco 1 IgA     U/mL 2.6  Anticardiolipin Ab,IgA,Qn     APL-U/mL 2.8  Anticardiolipin Ab,IgG,Qn     GPL-U/mL 3.4  Anticardiolipin Ab,IgM,Qn     MPL-U/mL 8.6  ANCA SCREEN     NEGATIVE NEGATIVE  Myeloperoxidase Abs     AI <1.0  Serine Protease 3     AI <1.0  C3 Complement     83 - 193 mg/dL 588  C4 Complement     15 - 57 mg/dL 32  CK Total     50.2 - 143.0 U/L 76  TSH     mIU/L 2.89  Cyclic Citrullin Peptide Ab     UNITS <16  Scleroderma (Scl-70) (ENA) Antibody, IgG     <1.0 NEG AI <1.0 NEG  Ribonucleic Protein(ENA) Antibody, IgG     <1.0 NEG AI <1.0 NEG  ENA SM Ab Ser-aCnc     <1.0 NEG AI <1.0 NEG  SSA (Ro) (ENA) Antibody, IgG     <1.0 NEG AI <1.0 NEG  SSB (La) (ENA) Antibody, IgG     <1.0 NEG AI <1.0 NEG  ds DNA Ab     IU/mL <1  G-6PDH     7.0 - 20.5 U/g Hgb 14.7     Allergy testing: deferred due to ongoing urticaria  Assessment and plan:   Hives (urticaria), chronic  - at this time etiology of hives and swelling is unknown  however you have signs cholinergic urticaria due to increase hives/itch with increase in body temperature.  Hives can be caused by a variety of different triggers including illness/infection, foods, medications, stings, exercise, pressure, vibrations, extremes of temperature to name a few however majority of the time there is no identifiable trigger.  Your symptoms have been ongoing for ~6 weeks making this chronic thus will obtain additional labwork to evaluate: tryptase, hive panel, environmental panel, alpha-gal panel  - reviewed labs done by rheumatologist  - for management of hives recommend the following high-does antihistamine regimen: Xyzal 5mg  twice a day, Pepcid 20mg  twice a day and Singulair 10mg  once a day.    If you notice any change in mood/behavior/sleep after starting Singulair then stop this medication and let know.  Symptoms resolve after stopping the medication.    - if high-dose antihistamine regimen is not effective enough in controlling hives then consider Xolair monthly injections.  Xolair discussed today and brochure provided  - reserve benadryl for breakthrough symptoms  Allergic rhinitis  - as above will obtain environmental allergy panel  - Xyzal and Singulair as above  - for nasal and ear symptoms recommend use of nasal steroid spray like Flonase, Rhinocort or Nasacort 2 sprays each nostril daily.  Use for 1-2 weeks at a time before stopping  Reactive airway  - triggered by allergies  - have access to albuterol inhaler 2 puffs every 4-6 hours as needed for cough/wheeze/shortness of breath/chest tightness.  May use 15-20 minutes prior to activity.   Monitor frequency of use.    Follow-up 2-3 months or sooner if needed  I appreciate the opportunity to take part in Lenoir's care. Please do not  hesitate to contact me with questions.  Sincerely,   Prudy Feeler, MD Allergy/Immunology [facility]

## 2019-11-03 NOTE — Patient Instructions (Addendum)
Hives (urticaria)  - at this time etiology of hives and swelling is unknown however you have signs cholinergic urticaria due to increase hives/itch with increase in body temperature.  Hives can be caused by a variety of different triggers including illness/infection, foods, medications, stings, exercise, pressure, vibrations, extremes of temperature to name a few however majority of the time there is no identifiable trigger.  Your symptoms have been ongoing for ~6 weeks making this chronic thus will obtain additional labwork to evaluate: tryptase, hive panel, environmental panel, alpha-gal panel  - reviewed labs done by rheumatologist  - for management of hives recommend the following high-does antihistamine regimen: Xyzal 5mg  twice a day, Pepcid 20mg  twice a day and Singulair 10mg  once a day.    If you notice any change in mood/behavior/sleep after starting Singulair then stop this medication and let know.  Symptoms resolve after stopping the medication.    - if high-dose antihistamine regimen is not effective enough in controlling hives then consider Xolair monthly injections.  Xolair discussed today and brochure provided  - reserve benadryl for breakthrough symptoms  Allergies  - as above will obtain environmental allergy panel  - Xyzal and Singulair as above  - for nasal and ear symptoms recommend use of nasal steroid spray like Flonase, Rhinocort or Nasacort 2 sprays each nostril daily.  Use for 1-2 weeks at a time before stopping  Reactive airway  - triggered by allergies  - have access to albuterol inhaler 2 puffs every 4-6 hours as needed for cough/wheeze/shortness of breath/chest tightness.  May use 15-20 minutes prior to activity.   Monitor frequency of use.    Follow-up 2-3 months or sooner if needed

## 2019-11-04 MED FILL — CITALOPRAM HBR 40 MG TABLET: 40 | 90 days supply | Qty: 90 | Fill #0

## 2019-11-06 ENCOUNTER — Encounter: Payer: Self-pay | Admitting: Rheumatology

## 2019-11-07 ENCOUNTER — Encounter: Payer: Self-pay | Admitting: Family Medicine

## 2019-11-07 LAB — COMPLETE METABOLIC PANEL WITH GFR
AG Ratio: 1.6 (calc) (ref 1.0–2.5)
ALT: 15 U/L (ref 6–29)
AST: 15 U/L (ref 10–30)
Albumin: 4.6 g/dL (ref 3.6–5.1)
Alkaline phosphatase (APISO): 89 U/L (ref 31–125)
BUN/Creatinine Ratio: 7 (calc) (ref 6–22)
BUN: 6 mg/dL — ABNORMAL LOW (ref 7–25)
CO2: 27 mmol/L (ref 20–32)
Calcium: 10.2 mg/dL (ref 8.6–10.2)
Chloride: 102 mmol/L (ref 98–110)
Creat: 0.89 mg/dL (ref 0.50–1.10)
GFR, Est African American: 93 mL/min/{1.73_m2} (ref >=60)
GFR, Est Non African American: 81 mL/min/{1.73_m2} (ref >=60)
Globulin: 2.9 g/dL (calc) (ref 1.9–3.7)
Glucose, Bld: 91 mg/dL (ref 65–99)
Potassium: 4 mmol/L (ref 3.5–5.3)
Sodium: 138 mmol/L (ref 135–146)
Total Bilirubin: 0.8 mg/dL (ref 0.2–1.2)
Total Protein: 7.5 g/dL (ref 6.1–8.1)

## 2019-11-07 LAB — CBC WITH DIFFERENTIAL/PLATELET
Absolute Monocytes: 422 cells/uL (ref 200–950)
Basophils Absolute: 40 cells/uL (ref 0–200)
Basophils Relative: 0.6 %
Eosinophils Absolute: 87 cells/uL (ref 15–500)
Eosinophils Relative: 1.3 %
HCT: 39.4 % (ref 35.0–45.0)
Hemoglobin: 13.3 g/dL (ref 11.7–15.5)
Lymphs Abs: 2017 cells/uL (ref 850–3900)
MCH: 29.4 pg (ref 27.0–33.0)
MCHC: 33.8 g/dL (ref 32.0–36.0)
MCV: 87 fL (ref 80.0–100.0)
MPV: 11.2 fL (ref 7.5–12.5)
Monocytes Relative: 6.3 %
Neutro Abs: 4134 cells/uL (ref 1500–7800)
Neutrophils Relative %: 61.7 %
Platelets: 306 10*3/uL (ref 140–400)
RBC: 4.53 10*6/uL (ref 3.80–5.10)
RDW: 12.8 % (ref 11.0–15.0)
Total Lymphocyte: 30.1 %
WBC: 6.7 10*3/uL (ref 3.8–10.8)

## 2019-11-07 LAB — URINALYSIS, ROUTINE W REFLEX MICROSCOPIC
Bilirubin Urine: NEGATIVE
Glucose, UA: NEGATIVE
Hgb urine dipstick: NEGATIVE
Leukocytes,Ua: NEGATIVE
Nitrite: NEGATIVE
Protein, ur: NEGATIVE
Specific Gravity, Urine: 1.021 (ref 1.001–1.03)
pH: 6.5 (ref 5.0–8.0)

## 2019-11-07 LAB — BETA-2 GLYCOPROTEIN ANTIBODIES
Beta-2 Glyco 1 IgA: 2.6 U/mL
Beta-2 Glyco 1 IgM: 7.1 U/mL
Beta-2 Glyco I IgG: <2 U/mL

## 2019-11-07 LAB — PAN-ANCA
ANCA Screen: NEGATIVE
Myeloperoxidase Abs: <1 AI
Serine Protease 3: <1 AI

## 2019-11-07 LAB — LUPUS ANTICOAGULANT EVAL W/ REFLEX
PTT-LA Screen: 36 s (ref <=40)
dRVVT: 38 s (ref <=45)

## 2019-11-07 LAB — GLUCOSE 6 PHOSPHATE DEHYDROGENASE: G-6PDH: 14.7 U/g Hgb (ref 7.0–20.5)

## 2019-11-07 LAB — C3 AND C4
C3 Complement: 150 mg/dL (ref 83–193)
C4 Complement: 32 mg/dL (ref 15–57)

## 2019-11-07 LAB — CARDIOLIPIN ANTIBODIES, IGG, IGM, IGA
Anticardiolipin IgA: 2.8 APL-U/mL
Anticardiolipin IgG: 3.4 GPL-U/mL
Anticardiolipin IgM: 8.6 MPL-U/mL

## 2019-11-07 LAB — TSH: TSH: 2.89 mIU/L

## 2019-11-07 LAB — ANTI-DNA ANTIBODY, DOUBLE-STRANDED: ds DNA Ab: <1 IU/mL

## 2019-11-07 LAB — ANTI-SMITH ANTIBODY: ENA SM Ab Ser-aCnc: <1 AI

## 2019-11-07 LAB — CYCLIC CITRUL PEPTIDE ANTIBODY, IGG: Cyclic Citrullin Peptide Ab: <16 UNITS

## 2019-11-07 LAB — ANTI-SCLERODERMA ANTIBODY: Scleroderma (Scl-70) (ENA) Antibody, IgG: <1 AI

## 2019-11-07 LAB — CRYOGLOBULIN: Cryoglobulin, Qualitative Analysis: NOT DETECTED

## 2019-11-07 LAB — RNP ANTIBODY: Ribonucleic Protein(ENA) Antibody, IgG: <1 AI

## 2019-11-07 LAB — SJOGRENS SYNDROME-B EXTRACTABLE NUCLEAR ANTIBODY: SSB (La) (ENA) Antibody, IgG: <1 AI

## 2019-11-07 LAB — CK: Total CK: 76 U/L (ref 29–143)

## 2019-11-07 LAB — SJOGRENS SYNDROME-A EXTRACTABLE NUCLEAR ANTIBODY: SSA (Ro) (ENA) Antibody, IgG: <1 AI

## 2019-11-08 MED FILL — LEVOCETIRIZINE 5 MG TABLET: 5 | 30 days supply | Qty: 60 | Fill #0

## 2019-11-08 NOTE — Telephone Encounter (Signed)
We could squeeze her in at 2:15 on weds or she could come at end of day

## 2019-11-09 NOTE — Addendum Note (Signed)
Addended by: Osa Craver on: 11/09/2019 09:51 AM   Modules accepted: Orders

## 2019-11-10 LAB — ALLERGENS, ZONE 2
Alternaria Alternata IgE: <0.1 kU/L
Amer Sycamore IgE Qn: 2.18 kU/L — AB
Aspergillus Fumigatus IgE: <0.1 kU/L
Bahia Grass IgE: 2.37 kU/L — AB
Bermuda Grass IgE: 1.85 kU/L — AB
Cat Dander IgE: 3.13 kU/L — AB
Cedar, Mountain IgE: 1.4 kU/L — AB
Cladosporium Herbarum IgE: <0.1 kU/L
Cockroach, American IgE: <0.1 kU/L
Common Silver Birch IgE: 1.64 kU/L — AB
D Farinae IgE: <0.1 kU/L
D Pteronyssinus IgE: <0.1 kU/L
Dog Dander IgE: 0.27 kU/L — AB
Elm, American IgE: 1.98 kU/L — AB
Hickory, White IgE: 1.91 kU/L — AB
Johnson Grass IgE: 2.31 kU/L — AB
Maple/Box Elder IgE: 1.78 kU/L — AB
Mucor Racemosus IgE: <0.1 kU/L
Mugwort IgE Qn: 1.45 kU/L — AB
Nettle IgE: 1.59 kU/L — AB
Oak, White IgE: 1.62 kU/L — AB
Penicillium Chrysogen IgE: <0.1 kU/L
Pigweed, Rough IgE: 1.68 kU/L — AB
Plantain, English IgE: 1.96 kU/L — AB
Ragweed, Short IgE: 1.79 kU/L — AB
Sheep Sorrel IgE Qn: 1.67 kU/L — AB
Stemphylium Herbarum IgE: <0.1 kU/L
Sweet gum IgE RAST Ql: 1.86 kU/L — AB
Timothy Grass IgE: 76.8 kU/L — AB
White Mulberry IgE: 1.29 kU/L — AB

## 2019-11-10 LAB — ALPHA-GAL PANEL
Alpha Gal IgE*: <0.1 kU/L (ref <0.10)
Beef (Bos spp) IgE: <0.1 kU/L (ref <0.35)
Class Interpretation: 0
Class Interpretation: 0
Class Interpretation: 0
Lamb/Mutton (Ovis spp) IgE: <0.1 kU/L (ref <0.35)
Pork (Sus spp) IgE: <0.1 kU/L (ref <0.35)

## 2019-11-10 LAB — TRYPTASE: Tryptase: 5.3 ug/L (ref 2.2–13.2)

## 2019-11-10 LAB — CHRONIC URTICARIA: cu index: <2.1 (ref <10)

## 2019-11-14 LAB — SPECIMEN STATUS REPORT

## 2019-11-14 LAB — IGE: IgE (Immunoglobulin E), Serum: 301 IU/mL (ref 6–495)

## 2019-11-14 NOTE — Progress Notes (Signed)
Office Visit Note  Patient: Lindsay Wong             Date of Birth: 1980-08-08           MRN: [id number]             PCP: Wynn BankerKoberlein, Junell C, MD Referring: Wynn BankerKoberlein, Junell C, MD Visit Date: 11/24/2019 Occupation: @GUAROCC @  Subjective:  Fatigue.   History of Present Illness: Lindsay Wong is a 39 y.o. female with history of fatigue and Raynaud's. She states she hasn't had any problems with oral ulcers in the last 4 weeks. She can still continues to have Raynaud's phenomenon. She recently went to the beach and was out in the sun.  She did not develop a rash.  She is not having any problems with hives now.  Although she has been feeling extremely tired.  She denies any joint swelling or inflammation.  She continues to have some stiffness in her joints.  Activities of Daily Living:  Patient reports morning stiffness for a few minutes.   Patient Reports nocturnal pain.  Difficulty dressing/grooming: Denies Difficulty climbing stairs: Reports Difficulty getting out of chair: Denies Difficulty using hands for taps, buttons, cutlery, and/or writing: Denies  Review of Systems  Constitutional: Positive for fatigue. Negative for night sweats, weight gain and weight loss.  HENT: Negative for mouth sores, trouble swallowing, trouble swallowing, mouth dryness and nose dryness.   Eyes: Negative for pain, redness, itching, visual disturbance and dryness.  Respiratory: Negative for cough, shortness of breath and difficulty breathing.   Cardiovascular: Negative for chest pain, palpitations, hypertension, irregular heartbeat and swelling in legs/feet.  Gastrointestinal: Positive for heartburn. Negative for blood in stool, constipation and diarrhea.  Endocrine: Negative for increased urination.  Genitourinary: Negative for difficulty urinating and vaginal dryness.  Musculoskeletal: Positive for arthralgias, joint pain, morning stiffness and muscle tenderness. Negative for joint swelling,  myalgias, muscle weakness and myalgias.  Skin: Positive for color change. Negative for rash, hair loss, redness, skin tightness, ulcers and sensitivity to sunlight.  Allergic/Immunologic: Negative for susceptible to infections.  Neurological: Positive for headaches. Negative for dizziness, numbness, memory loss, night sweats and weakness.       History of migraine  Hematological: Negative for bruising/bleeding tendency and swollen glands.  Psychiatric/Behavioral: Negative for depressed mood, confusion and sleep disturbance. The patient is not nervous/anxious.     PMFS History:  Patient Active Problem List   Diagnosis Date Noted  . Fatigue 08/20/2019  . Anxiety and depression 05/19/2017  . Left shoulder pain 07/19/2014  . RAYNAUD'S DISEASE 01/31/2007    Past Medical History:  Diagnosis Date  . Allergy   . Anxiety and depression    per patient diagnosed by psychiatrist  . Appendicitis 07/20/2012  . Blood in stool   . Chicken pox   . Depression    postpartum with one child  . GERD 01/31/2007   Qualifier: Diagnosis of  By: [name] RMA, [name]    . Hyperlipidemia    postpartum  . PONV (postoperative nausea and vomiting)   . RAYNAUD'S DISEASE 01/31/2007   Qualifier: Diagnosis of  By: Samara SnideBrand RMA, [name]      Family History  Problem Relation Age of Onset  . Early menopause Mother   . Other Father        no contact since infant  . Raynaud syndrome Sister   . Cancer Maternal Grandmother        melanoma  . Stroke Maternal Grandmother [id number]  .  Arthritis Maternal Grandmother   . Hyperlipidemia Maternal Grandmother   . Hypertension Maternal Grandmother   . Breast cancer Maternal Grandmother 72  . Cancer Maternal Grandfather        bladder  . Lung cancer Other        all but one of grandmother's siblings   . Lupus Maternal Uncle   . Multiple sclerosis Cousin   . Autism Son   . Healthy Son   . Healthy Son   . Healthy Son    Past Surgical History:  Procedure Laterality Date  . CESAREAN  SECTION     x3  . LAPAROSCOPIC APPENDECTOMY Right 07/20/2012   Procedure: APPENDECTOMY LAPAROSCOPIC;  Surgeon: Velora Heckler, MD;  Location: [facility];  Service: General;  Laterality: Right;  . 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 Influenza Split 01/04/2011  . Influenza Whole 04/07/2006, 02/01/2008  . Influenza,inj,Quad PF,6+ Mos 01/14/2014, 02/08/2015, 02/20/2016, 02/12/2017, 02/13/2018, 03/01/2019  . Influenza-Unspecified 01/11/2013, 02/11/2016  . Tdap 03/01/2019     Objective: Vital Signs: BP 126/87 (BP Location: Right Arm, Patient Position: Sitting, Cuff Size: Normal)   Pulse 87   Resp 14   Ht 5' 2.5" (1.588 m)   Wt 153 lb 9.6 oz (69.7 kg)   BMI 27.65 kg/m    Physical Exam Vitals and nursing note reviewed.  Constitutional:      Appearance: She is well-developed.  HENT:     Head: Normocephalic and atraumatic.  Eyes:     Conjunctiva/sclera: Conjunctivae normal.  Cardiovascular:     Rate and Rhythm: Normal rate and regular rhythm.     Heart sounds: Normal heart sounds.  Pulmonary:     Effort: Pulmonary effort is normal.     Breath sounds: Normal breath sounds.  Abdominal:     General: Bowel sounds are normal.     Palpations: Abdomen is soft.  Musculoskeletal:     Cervical back: Normal range of motion.  Lymphadenopathy:     Cervical: No cervical adenopathy.  Skin:    General: Skin is warm and dry.     Capillary Refill: Capillary refill takes less than 2 seconds.  Neurological:     Mental Status: She is alert and oriented to person, place, and time.  Psychiatric:        Behavior: Behavior normal.        Musculoskeletal Exam: C-spine thoracic and lumbar spine with good range of motion.  Shoulder joints, elbow joints, wrist joints, MCPs PIPs and DIPs with good range of motion with no synovitis.  Hip joints, knee joints, ankles, MTPs PIPs and DIPs good range of motion with no synovitis.  CDAI Exam: CDAI Score: -- Patient Global: --; Provider Global: -- Swollen: --; Tender: -- Joint Exam 11/24/2019   No joint exam has been documented for this visit   There is currently no information documented on the homunculus. Go to the Rheumatology activity and complete the homunculus joint exam.  Investigation: No additional findings.  Imaging: XR HIPS BILAT W OR W/O PELVIS 3-4 VIEWS  Result Date: 11/01/2019 No SI joint sclerosis or narrowing was noted.  No hip joint narrowing was noted. Impression: Unremarkable x-ray of the hip joints.  XR Foot 2 Views Left  Result Date: 11/01/2019 No MCP, PIP or DIP narrowing was noted.  No intertarsal or tibiotalar joint space narrowing was noted. Impression: Unremarkable x-ray of the foot.  XR Foot 2 Views Right  Result Date: 11/01/2019 No MCP, PIP or DIP narrowing was noted.  No intertarsal or tibiotalar joint space narrowing was noted. Impression: Unremarkable x-ray of the foot.  XR Hand 2 View Left  Result Date: 11/01/2019 No MCP, PIP or DIP narrowing was noted.  No intercarpal radiocarpal joint space narrowing was noted.  No erosive changes were noted. Impression: Unremarkable x-ray of the hand.  XR Hand 2 View Right  Result Date: 11/01/2019 No MCP, PIP or DIP narrowing was noted.  No intercarpal radiocarpal joint space narrowing was noted.  No erosive changes were noted. Impression: Unremarkable x-ray of the hand.  XR KNEE 3 VIEW LEFT  Result Date: 11/01/2019 Moderate medial compartment narrowing was noted.  Moderate patellofemoral narrowing was noted.  No chondrocalcinosis was noted. Impression: These findings are consistent with moderate  osteoarthritis and moderate chondromalacia patella.  XR KNEE 3 VIEW RIGHT  Result Date: 11/01/2019 Moderate medial compartment narrowing was noted.  Moderate patellofemoral narrowing was noted.  No chondrocalcinosis was noted. Impression: These findings are consistent with moderate osteoarthritis and moderate chondromalacia patella.   Recent Labs: Lab Results  Component Value Date   WBC 6.7 11/01/2019   HGB 13.3 11/01/2019   PLT 306 11/01/2019   NA 138 11/01/2019   K 4.0 11/01/2019   CL 102 11/01/2019   CO2 27 11/01/2019   GLUCOSE 91 11/01/2019   BUN 6 (L) 11/01/2019   CREATININE 0.89 11/01/2019   BILITOT 0.8 11/01/2019   ALKPHOS 89 07/17/2018   AST 15 11/01/2019   ALT 15 11/01/2019   PROT 7.5 11/01/2019   ALBUMIN 4.5 07/17/2018   CALCIUM 10.2 11/01/2019   GFRAA 93 11/01/2019  November 01, 2019 UA negative, lupus anticoagulant negative, beta-2 negative, anticardiolipin negative, pan-ANCA negative, C3-C4 normal, ENA negative, cryoglobulins negative, anti-CCP negative, C3-C4 normal, CK normal, TSH normal  Speciality Comments: No specialty comments available.  Procedures:  No procedures performed Allergies: Clarithromycin, Peg-2000 (covid-19 mrna vaccine component), and Latex   Assessment / Plan:     Visit Diagnoses: Positive ANA (antinuclear antibody) - ANA 1: 640 speckled pattern, ENA negative, C3-C4 normal, anticardiolipin, beta-2, LA-,  History of oral ulcers, arthralgias, photosensitivity, Raynaud's and hives.  Patient denies any recent episodes of hives.  She states she has not had any oral ulcers in the last month.  She had sun exposure without photosensitivity lately.  She states she continues to have some stiffness in her joints.  She denies any joint swelling.  We discussed possible use of Plaquenil if her symptoms get worse.  I would like to hold off treatment at this point and monitor.  Raynaud's syndrome without gangrene - History of digital ulcer.  She had decreased  capillary refill.  She was placed on amlodipine at the last visit.  She has had very good response to amlodipine.  She had good capillary refill and her symptoms have improved.  Hives-she had no recent hives.  Pain in both hands - X-rays were unremarkable.  Pain in both feet - X-rays were unremarkable.  Primary osteoarthritis of both  knees - Bilateral moderate osteoarthritis and moderate chondromalacia patella.  Muscle strengthening was discussed.  A handout on exercises was given.  Have also discussed the use of Voltaren gel.  Pre-eclampsia in third trimester - History of preterm labor and preeclampsia with all 3 pregnancies.  Anxiety and depression  History of esophageal reflux-she takes Aleve on as needed basis.  Have advised her to avoid Aleve due to reflux symptoms.  History of migraine - Followed by neurologist.  Family history of multiple sclerosis - Maternal uncle and first cousin  Orders: No orders of the defined types were placed in this encounter.  No orders of the defined types were placed in this encounter.   .  Follow-Up Instructions: Return in about 3 months (around 02/24/2020) for +ANA, fatigue.   Pollyann Savoy, MD  Note - This record has been created using Animal nutritionist.  Chart creation errors have been sought, but may not always  have been located. Such creation errors do not reflect on  the standard of medical care.

## 2019-11-15 MED FILL — ESOMEPRAZOLE MAG DR 40 MG C: 40 | 30 days supply | Qty: 30 | Fill #2

## 2019-11-24 ENCOUNTER — Other Ambulatory Visit: Payer: Self-pay

## 2019-11-24 ENCOUNTER — Encounter: Payer: Self-pay | Admitting: Rheumatology

## 2019-11-24 ENCOUNTER — Ambulatory Visit: Payer: No Typology Code available for payment source | Admitting: Rheumatology

## 2019-11-24 VITALS — BP 126/87 | HR 87 | Resp 14 | Ht 62.5 in | Wt 153.6 lb

## 2019-11-24 DIAGNOSIS — R768 Other specified abnormal immunological findings in serum: Secondary | ICD-10-CM

## 2019-11-24 DIAGNOSIS — F32A Depression, unspecified: Secondary | ICD-10-CM

## 2019-11-24 DIAGNOSIS — M79672 Pain in left foot: Secondary | ICD-10-CM

## 2019-11-24 DIAGNOSIS — M79671 Pain in right foot: Secondary | ICD-10-CM

## 2019-11-24 DIAGNOSIS — F329 Major depressive disorder, single episode, unspecified: Secondary | ICD-10-CM

## 2019-11-24 DIAGNOSIS — Z8669 Personal history of other diseases of the nervous system and sense organs: Secondary | ICD-10-CM

## 2019-11-24 DIAGNOSIS — O1493 Unspecified pre-eclampsia, third trimester: Secondary | ICD-10-CM

## 2019-11-24 DIAGNOSIS — L509 Urticaria, unspecified: Secondary | ICD-10-CM

## 2019-11-24 DIAGNOSIS — M79641 Pain in right hand: Secondary | ICD-10-CM

## 2019-11-24 DIAGNOSIS — M79642 Pain in left hand: Secondary | ICD-10-CM

## 2019-11-24 DIAGNOSIS — I7300 Raynaud's syndrome without gangrene: Secondary | ICD-10-CM | POA: Diagnosis not present

## 2019-11-24 DIAGNOSIS — Z820 Family history of epilepsy and other diseases of the nervous system: Secondary | ICD-10-CM

## 2019-11-24 DIAGNOSIS — F419 Anxiety disorder, unspecified: Secondary | ICD-10-CM

## 2019-11-24 DIAGNOSIS — M170 Bilateral primary osteoarthritis of knee: Secondary | ICD-10-CM

## 2019-11-24 DIAGNOSIS — Z8719 Personal history of other diseases of the digestive system: Secondary | ICD-10-CM

## 2019-11-24 NOTE — Patient Instructions (Signed)
Journal for Nurse Practitioners, 15(4), 263-267. Retrieved February 16, 2018 from http://clinicalkey.com/nursing">  Knee Exercises Ask your health care provider which exercises are safe for you. Do exercises exactly as told by your health care provider and adjust them as directed. It is normal to feel mild stretching, pulling, tightness, or discomfort as you do these exercises. Stop right away if you feel sudden pain or your pain gets worse. Do not begin these exercises until told by your health care provider. Stretching and range-of-motion exercises These exercises warm up your muscles and joints and improve the movement and flexibility of your knee. These exercises also help to relieve pain and swelling. Knee extension, prone 1. Lie on your abdomen (prone position) on a bed. 2. Place your left / right knee just beyond the edge of the surface so your knee is not on the bed. You can put a towel under your left / right thigh just above your kneecap for comfort. 3. Relax your leg muscles and allow gravity to straighten your knee (extension). You should feel a stretch behind your left / right knee. 4. Hold this position for __________ seconds. 5. Scoot up so your knee is supported between repetitions. Repeat __________ times. Complete this exercise __________ times a day. Knee flexion, active  1. Lie on your back with both legs straight. If this causes back discomfort, bend your left / right knee so your foot is flat on the floor. 2. Slowly slide your left / right heel back toward your buttocks. Stop when you feel a gentle stretch in the front of your knee or thigh (flexion). 3. Hold this position for __________ seconds. 4. Slowly slide your left / right heel back to the starting position. Repeat __________ times. Complete this exercise __________ times a day. Quadriceps stretch, prone  1. Lie on your abdomen on a firm surface, such as a bed or padded floor. 2. Bend your left / right knee and hold  your ankle. If you cannot reach your ankle or pant leg, loop a belt around your foot and grab the belt instead. 3. Gently pull your heel toward your buttocks. Your knee should not slide out to the side. You should feel a stretch in the front of your thigh and knee (quadriceps). 4. Hold this position for __________ seconds. Repeat __________ times. Complete this exercise __________ times a day. Hamstring, supine 1. Lie on your back (supine position). 2. Loop a belt or towel over the ball of your left / right foot. The ball of your foot is on the walking surface, right under your toes. 3. Straighten your left / right knee and slowly pull on the belt to raise your leg until you feel a gentle stretch behind your knee (hamstring). ? Do not let your knee bend while you do this. ? Keep your other leg flat on the floor. 4. Hold this position for __________ seconds. Repeat __________ times. Complete this exercise __________ times a day. Strengthening exercises These exercises build strength and endurance in your knee. Endurance is the ability to use your muscles for a long time, even after they get tired. Quadriceps, isometric This exercise stretches the muscles in front of your thigh (quadriceps) without moving your knee joint (isometric). 1. Lie on your back with your left / right leg extended and your other knee bent. Put a rolled towel or small pillow under your knee if told by your health care provider. 2. Slowly tense the muscles in the front of your left /   right thigh. You should see your kneecap slide up toward your hip or see increased dimpling just above the knee. This motion will push the back of the knee toward the floor. 3. For __________ seconds, hold the muscle as tight as you can without increasing your pain. 4. Relax the muscles slowly and completely. Repeat __________ times. Complete this exercise __________ times a day. Straight leg raises This exercise stretches the muscles in front  of your thigh (quadriceps) and the muscles that move your hips (hip flexors). 1. Lie on your back with your left / right leg extended and your other knee bent. 2. Tense the muscles in the front of your left / right thigh. You should see your kneecap slide up or see increased dimpling just above the knee. Your thigh may even shake a bit. 3. Keep these muscles tight as you raise your leg 4-6 inches (10-15 cm) off the floor. Do not let your knee bend. 4. Hold this position for __________ seconds. 5. Keep these muscles tense as you lower your leg. 6. Relax your muscles slowly and completely after each repetition. Repeat __________ times. Complete this exercise __________ times a day. Hamstring, isometric 1. Lie on your back on a firm surface. 2. Bend your left / right knee about __________ degrees. 3. Dig your left / right heel into the surface as if you are trying to pull it toward your buttocks. Tighten the muscles in the back of your thighs (hamstring) to "dig" as hard as you can without increasing any pain. 4. Hold this position for __________ seconds. 5. Release the tension gradually and allow your muscles to relax completely for __________ seconds after each repetition. Repeat __________ times. Complete this exercise __________ times a day. Hamstring curls If told by your health care provider, do this exercise while wearing ankle weights. Begin with __________ lb weights. Then increase the weight by 1 lb (0.5 kg) increments. Do not wear ankle weights that are more than __________ lb. 1. Lie on your abdomen with your legs straight. 2. Bend your left / right knee as far as you can without feeling pain. Keep your hips flat against the floor. 3. Hold this position for __________ seconds. 4. Slowly lower your leg to the starting position. Repeat __________ times. Complete this exercise __________ times a day. Squats This exercise strengthens the muscles in front of your thigh and knee  (quadriceps). 1. Stand in front of a table, with your feet and knees pointing straight ahead. You may rest your hands on the table for balance but not for support. 2. Slowly bend your knees and lower your hips like you are going to sit in a chair. ? Keep your weight over your heels, not over your toes. ? Keep your lower legs upright so they are parallel with the table legs. ? Do not let your hips go lower than your knees. ? Do not bend lower than told by your health care provider. ? If your knee pain increases, do not bend as low. 3. Hold the squat position for __________ seconds. 4. Slowly push with your legs to return to standing. Do not use your hands to pull yourself to standing. Repeat __________ times. Complete this exercise __________ times a day. Wall slides This exercise strengthens the muscles in front of your thigh and knee (quadriceps). 1. Lean your back against a smooth wall or door, and walk your feet out 18-24 inches (46-61 cm) from it. 2. Place your feet hip-width apart. 3.   Slowly slide down the wall or door until your knees bend __________ degrees. Keep your knees over your heels, not over your toes. Keep your knees in line with your hips. 4. Hold this position for __________ seconds. Repeat __________ times. Complete this exercise __________ times a day. Straight leg raises This exercise strengthens the muscles that rotate the leg at the hip and move it away from your body (hip abductors). 1. Lie on your side with your left / right leg in the top position. Lie so your head, shoulder, knee, and hip line up. You may bend your bottom knee to help you keep your balance. 2. Roll your hips slightly forward so your hips are stacked directly over each other and your left / right knee is facing forward. 3. Leading with your heel, lift your top leg 4-6 inches (10-15 cm). You should feel the muscles in your outer hip lifting. ? Do not let your foot drift forward. ? Do not let your knee  roll toward the ceiling. 4. Hold this position for __________ seconds. 5. Slowly return your leg to the starting position. 6. Let your muscles relax completely after each repetition. Repeat __________ times. Complete this exercise __________ times a day. Straight leg raises This exercise stretches the muscles that move your hips away from the front of the pelvis (hip extensors). 1. Lie on your abdomen on a firm surface. You can put a pillow under your hips if that is more comfortable. 2. Tense the muscles in your buttocks and lift your left / right leg about 4-6 inches (10-15 cm). Keep your knee straight as you lift your leg. 3. Hold this position for __________ seconds. 4. Slowly lower your leg to the starting position. 5. Let your leg relax completely after each repetition. Repeat __________ times. Complete this exercise __________ times a day. This information is not intended to replace advice given to you by your health care provider. Make sure you discuss any questions you have with your health care provider. Document Revised: 02/17/2018 Document Reviewed: 02/17/2018 Elsevier Patient Education  2020 [employer]  

## 2019-11-24 NOTE — Telephone Encounter (Signed)
Patient has history of Raynaud's and positive ANA.  She also has episodes of oral ulcers, photosensitivity.  We discussed use of Plaquenil today in the office but she declined.  She wants to start on the Plaquenil.  Please schedule an appointment to discuss Plaquenil and start her on Plaquenil.

## 2019-11-26 NOTE — Progress Notes (Deleted)
Office Visit Note  Patient: Lindsay Wong             Date of Birth: 21-Oct-1980           MRN: [id number]             PCP: Wynn Banker, MD Referring: Wynn Banker, MD Visit Date: 12/09/2019 Occupation: @GUAROCC @  Subjective:  No chief complaint on file.   History of Present Illness: Lindsay Wong is a 39 y.o. female ***   Activities of Daily Living:  Patient reports morning stiffness for *** {minute/hour:19697}.   Patient {ACTIONS;DENIES/REPORTS:21021675::"Denies"} nocturnal pain.  Difficulty dressing/grooming: {ACTIONS;DENIES/REPORTS:21021675::"Denies"} Difficulty climbing stairs: {ACTIONS;DENIES/REPORTS:21021675::"Denies"} Difficulty getting out of chair: {ACTIONS;DENIES/REPORTS:21021675::"Denies"} Difficulty using hands for taps, buttons, cutlery, and/or writing: {ACTIONS;DENIES/REPORTS:21021675::"Denies"}  No Rheumatology ROS completed.   PMFS History:  Patient Active Problem List   Diagnosis Date Noted   Fatigue 08/20/2019   Anxiety and depression 05/19/2017   Left shoulder pain 07/19/2014   RAYNAUD'S DISEASE 01/31/2007    Past Medical History:  Diagnosis Date   Allergy    Anxiety and depression    per patient diagnosed by psychiatrist   Appendicitis 07/20/2012   Blood in stool    Chicken pox    Depression    postpartum with one child   GERD 01/31/2007   Qualifier: Diagnosis of  By: 02/02/2007 RMA, [name]     Hyperlipidemia    postpartum   PONV (postoperative nausea and vomiting)    RAYNAUD'S DISEASE 01/31/2007   Qualifier: Diagnosis of  By: 02/02/2007      Family History  Problem Relation Age of Onset   Early menopause Mother    Other Father        no contact since infant   Raynaud syndrome Sister    Cancer Maternal Grandmother        melanoma   Stroke Maternal Grandmother 67   Arthritis Maternal Grandmother    Hyperlipidemia Maternal Grandmother    Hypertension Maternal Grandmother    Breast cancer Maternal  Grandmother 72   Cancer Maternal Grandfather        bladder   Lung cancer Other        all but one of grandmother's siblings    Lupus Maternal Uncle    Multiple sclerosis Cousin    Autism Son    Healthy Son    Healthy Son    Healthy Son    Past Surgical History:  Procedure Laterality Date   CESAREAN SECTION     x3   LAPAROSCOPIC APPENDECTOMY Right 07/20/2012   Procedure: APPENDECTOMY LAPAROSCOPIC;  Surgeon: 09/19/2012, MD;  Location: [facility];  Service: General;  Laterality: Right;   WISDOM TOOTH EXTRACTION     Social History   Social History Narrative   Work or School: homemaker, 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   Physical Exam   Musculoskeletal Exam: ***  CDAI Exam: CDAI Score: -- Patient Global: --; Provider Global: -- Swollen: --; Tender: -- Joint Exam 12/09/2019   No joint exam has been documented for this visit   There is currently no information documented on the homunculus. Go to the Rheumatology activity and complete the homunculus joint exam.  Investigation: No additional findings.  Imaging: XR HIPS BILAT W OR W/O PELVIS 3-4 VIEWS  Result Date: 11/01/2019 No SI joint sclerosis or narrowing was noted.  No hip joint narrowing was noted. Impression: Unremarkable x-ray of the hip joints.  XR Foot 2 Views Left  Result Date: 11/01/2019 No MCP, PIP or DIP narrowing was  noted.  No intertarsal or tibiotalar joint space narrowing was noted. Impression: Unremarkable x-ray of the foot.  XR Foot 2 Views Right  Result Date: 11/01/2019 No MCP, PIP or DIP narrowing was noted.  No intertarsal or tibiotalar joint space narrowing was noted. Impression: Unremarkable x-ray of the foot.  XR Hand 2 View Left  Result Date: 11/01/2019 No MCP, PIP or DIP narrowing was noted.  No intercarpal radiocarpal joint space narrowing was noted.  No erosive changes were noted. Impression: Unremarkable x-ray of the hand.  XR Hand 2 View Right  Result Date: 11/01/2019 No MCP, PIP or DIP narrowing was noted.  No intercarpal radiocarpal joint space narrowing was noted.  No erosive changes were noted. Impression: Unremarkable x-ray of the hand.  XR KNEE 3 VIEW LEFT  Result Date: 11/01/2019 Moderate medial compartment narrowing was noted.  Moderate patellofemoral narrowing was noted.  No chondrocalcinosis was noted. Impression: These findings are consistent with moderate osteoarthritis and moderate chondromalacia patella.  XR KNEE 3 VIEW RIGHT  Result Date: 11/01/2019 Moderate medial compartment narrowing was noted.  Moderate patellofemoral narrowing was noted.  No chondrocalcinosis was noted. Impression: These findings are consistent with moderate osteoarthritis and moderate chondromalacia patella.   Recent Labs: Lab Results  Component Value Date   WBC 6.7 11/01/2019   HGB 13.3 11/01/2019   PLT 306 11/01/2019   NA 138 11/01/2019   K 4.0 11/01/2019   CL 102 11/01/2019   CO2 27 11/01/2019   GLUCOSE 91 11/01/2019   BUN 6 (L) 11/01/2019   CREATININE 0.89 11/01/2019   BILITOT 0.8 11/01/2019   ALKPHOS 89 07/17/2018   AST 15 11/01/2019   ALT 15 11/01/2019   PROT 7.5 11/01/2019   ALBUMIN 4.5 07/17/2018   CALCIUM 10.2 11/01/2019   GFRAA 93 11/01/2019    Speciality Comments: No specialty comments available.  Procedures:  No procedures performed Allergies: Clarithromycin,  Peg-2000 (covid-19 mrna vaccine component), and Latex   Assessment / Plan:     Visit Diagnoses: No diagnosis found.  Orders: No orders of the defined types were placed in this encounter.  No orders of the defined types were placed in this encounter.   Face-to-face time spent with patient was *** minutes. Greater than 50% of time was spent in counseling and coordination of care.  Follow-Up Instructions: No follow-ups on file.   Ellen Henri, CMA  Note - This record has been created using Animal nutritionist.  Chart creation errors have been sought, but may not always  have been located. Such creation errors do not reflect on  the standard of medical care.

## 2019-11-29 MED FILL — AMLODIPINE BESYLATE 5 MG TA: 5 | 30 days supply | Qty: 30 | Fill #1

## 2019-11-30 ENCOUNTER — Encounter: Payer: Self-pay | Admitting: Rheumatology

## 2019-12-09 ENCOUNTER — Ambulatory Visit: Payer: No Typology Code available for payment source | Admitting: Physician Assistant

## 2019-12-20 MED FILL — ESOMEPRAZOLE MAG DR 40 MG C: 40 | 30 days supply | Qty: 30 | Fill #3

## 2019-12-21 ENCOUNTER — Telehealth: Payer: Self-pay | Admitting: Rheumatology

## 2019-12-21 NOTE — Telephone Encounter (Signed)
Patient states she is not feeling well. Patient states she had a migraine x 3 days. Patient states over the weekend the back of her thighs were burning and her arms were weak. Patient states she was fatigued and slept all weekend. Patient denies any fever. Patient states she had pain in her hips and lower back last week. Patient states she is having soreness in her nostrils. Please advise.

## 2019-12-21 NOTE — Telephone Encounter (Signed)
She should be evaluated by her PCP 

## 2019-12-21 NOTE — Telephone Encounter (Signed)
Patient calling because last week she has been experiencing general muscle pain, and weakness. Patient has a nose sore on both sides now.Patient had a bad migraine for 3 days. Please call to advise.

## 2019-12-22 NOTE — Telephone Encounter (Signed)
Patient advised she should be evaluated by her PCP.

## 2019-12-23 ENCOUNTER — Encounter: Payer: Self-pay | Admitting: Family Medicine

## 2019-12-23 DIAGNOSIS — E785 Hyperlipidemia, unspecified: Secondary | ICD-10-CM

## 2019-12-23 DIAGNOSIS — G43809 Other migraine, not intractable, without status migrainosus: Secondary | ICD-10-CM

## 2019-12-23 DIAGNOSIS — R5383 Other fatigue: Secondary | ICD-10-CM

## 2019-12-23 DIAGNOSIS — R768 Other specified abnormal immunological findings in serum: Secondary | ICD-10-CM

## 2019-12-23 DIAGNOSIS — Z1159 Encounter for screening for other viral diseases: Secondary | ICD-10-CM

## 2019-12-23 MED FILL — DULoxetine HCL 30 MG CPEP: 30 | 30 days supply | Qty: 30 | Fill #0

## 2019-12-28 ENCOUNTER — Other Ambulatory Visit: Payer: Self-pay

## 2019-12-28 ENCOUNTER — Ambulatory Visit (INDEPENDENT_AMBULATORY_CARE_PROVIDER_SITE_OTHER): Payer: No Typology Code available for payment source

## 2019-12-28 ENCOUNTER — Ambulatory Visit: Payer: No Typology Code available for payment source | Admitting: Orthopaedic Surgery

## 2019-12-28 ENCOUNTER — Encounter: Payer: Self-pay | Admitting: Orthopaedic Surgery

## 2019-12-28 VITALS — BP 95/65 | HR 78 | Ht 62.0 in | Wt 153.0 lb

## 2019-12-28 DIAGNOSIS — M4802 Spinal stenosis, cervical region: Secondary | ICD-10-CM | POA: Insufficient documentation

## 2019-12-28 DIAGNOSIS — M542 Cervicalgia: Secondary | ICD-10-CM | POA: Diagnosis not present

## 2019-12-28 NOTE — Telephone Encounter (Signed)
Please repeat ANA titer . Thank you, Janalyn Rouse

## 2019-12-28 NOTE — Telephone Encounter (Signed)
Lindsay Wong I reviewed her chart.  Besides fatigue and Raynaud's she did not have any other symptoms.  She has significant ANA titer.  All other autoimmune work-up was negative.  She had no synovitis on my examination.  If all her recent labs are negative and she wants to try Plaquenil then she can call us to schedule an appointment to discuss possible use of Plaquenil.  We can give it a trial over the 3 months and see if she has response to it.  I cannot make a definite diagnosis of a particular autoimmune disease .

## 2019-12-28 NOTE — Progress Notes (Signed)
Office Visit Note   Patient: Lindsay Wong           Date of Birth: 1981-05-03           MRN: [id number] Visit Date: 12/28/2019              Requested by: Van Clines, MD [address] [place],  [place] [address] PCP: Wynn Banker, MD   Assessment & Plan: Visit Diagnoses:  1. Neck pain   2. Spinal stenosis of cervical region     Plan: Patient has normal cord signal some moderate stenosis at C4-5 C5-6 but no evidence of radiculopathy or myelopathy.  I plan to recheck her in 6 months we discussed signs and symptoms that would be of concern.  If they develop she can return earlier.  MRI scan was reviewed as well as plain x-rays pathophysiology discussed.  No indications for surgery at this time she understands some point she may require two-level cervical fusion.  Follow-Up Instructions: Return in about 6 months (around 06/29/2020).   Orders:  Orders Placed This Encounter  Procedures  . XR Cervical Spine 2 or 3 views   No orders of the defined types were placed in this encounter.     Procedures: No procedures performed   Clinical Data: No additional findings.   Subjective: Chief Complaint  Patient presents with  . Neck - Pain    HPI 40 year old female with 3 teenage boys has had problems with some headaches intermittently some numbness tends to come and go a little bit more in the left upper extremity than right.  She has noticed some discomfort when she keeps her arms up overhead rest to do some overhead lifting or fixing her hair for period of time.  She had a positive ANA and more extensive lab work-up was not revealing for any specific type of mixed connective tissue disorder.  Aleve bothers her stomach she is used some intermittent heat she is not noticed any problems with gait.  She states she has had some problems with her knees sometimes in her hips pain usually bothers her for a week or so and then she states that she feels much better and gets  around great until it tends to happen again sometimes several months later.  Does not seem to be associated with migraine problems.  No associated bowel or bladder symptoms. Cervical MRI scan was obtained.  She had a chest x-ray that showed some right thoracic curvature with minimal lumbar curve and curvature in the cervical spine with cervical stenosis to level at C4-5 and C5-6.  No cord abnormality and stenosis was moderate. Review of Systems positive for Raynaud's anxiety depression cervical stenosis.  The rest 14 point systems is noncontributory.   Objective: Vital Signs: BP 95/65   Pulse 78   Ht 5\' 2"  (1.575 m)   Wt 153 lb (69.4 kg)   BMI 27.98 kg/m   Physical Exam Constitutional:      Appearance: She is well-developed.  HENT:     Head: Normocephalic.     Right Ear: External ear normal.     Left Ear: External ear normal.  Eyes:     Pupils: Pupils are equal, round, and reactive to light.  Neck:     Thyroid: No thyromegaly.     Trachea: No tracheal deviation.  Cardiovascular:     Rate and Rhythm: Normal rate.  Pulmonary:     Effort: Pulmonary effort is normal.  Abdominal:  Palpations: Abdomen is soft.  Skin:    General: Skin is warm and dry.  Neurological:     Mental Status: She is alert and oriented to person, place, and time.  Psychiatric:        Behavior: Behavior normal.     Ortho Exam patient has full cervical range of motion she talks she moves her neck and head rapidly without discomfort.  Negative Spurling right and left negative tension of the shoulders.  Upper extremity reflexes are 3+ and symmetrical.  Lower extremities are 3+ no clonus.  Biceps triceps are strong no impingement of the shoulder deltoids are strong.  Wrist flexion extension grip is all normal. Specialty Comments:  No specialty comments available.  Imaging:  CLINICAL DATA:  Initial evaluation for worsening headaches with cognitive changes, hyper reflexia, brain fog,  migraines.  EXAM: MRI HEAD WITHOUT AND WITH CONTRAST  MRI CERVICAL SPINE WITHOUT AND WITH CONTRAST  TECHNIQUE: Multiplanar, multiecho pulse sequences of the brain and surrounding structures, and cervical spine, to include the craniocervical junction and cervicothoracic junction, were obtained without and with intravenous contrast.  CONTRAST:  71mL MULTIHANCE GADOBENATE DIMEGLUMINE 529 MG/ML IV SOLN  COMPARISON:  None available.  FINDINGS: MRI HEAD FINDINGS  Brain: Cerebral volume within normal limits for patient age. No focal parenchymal signal abnormality identified. Subcentimeter dilated perivascular space noted adjacent to the frontal horn of the left lateral ventricle.  No abnormal foci of restricted diffusion to suggest acute or subacute ischemia. Gray-white matter differentiation well maintained. No encephalomalacia to suggest chronic infarction. No foci of susceptibility artifact to suggest acute or chronic intracranial hemorrhage.  No mass lesion, midline shift or mass effect. No hydrocephalus. No extra-axial fluid collection. Major dural sinuses are grossly patent.  Pituitary gland and suprasellar region are normal. Midline structures intact and normal.  No abnormal enhancement.  Vascular: Major intracranial vascular flow voids well maintained and normal in appearance.  Skull and upper cervical spine: Craniocervical junction normal. Visualized upper cervical spine within normal limits. Bone marrow signal intensity normal. No scalp soft tissue abnormality.  Sinuses/Orbits: Globes and orbital soft tissues within normal limits.  Paranasal sinuses are clear. Trace left mastoid effusion noted, of doubtful significance. Inner ear structures normal.  Other: None.  MRI CERVICAL SPINE FINDINGS  Alignment: Straightening of the normal cervical lordosis with underlying dextroscoliosis. Trace 2 mm retrolisthesis of C4 on C5, chronic and  degenerative.  Vertebrae: Vertebral body height maintained without evidence for acute or chronic fracture. Bone marrow signal intensity within normal limits. Subcentimeter benign hemangioma noted within the T1 vertebral body. No worrisome osseous lesions. Discogenic reactive endplate changes noted about the C4-5 and C5-6 interspaces. No other abnormal marrow edema or enhancement.  Cord: Signal intensity within the cervical spinal cord is normal. No abnormal enhancement.  Posterior Fossa, vertebral arteries, paraspinal tissues: Craniocervical junction within normal limits. Paraspinous and prevertebral soft tissues are normal. Normal flow voids seen within the vertebral arteries bilaterally. Strongly dominant right vertebral artery noted.  Disc levels:  C2-C3: Unremarkable.  C3-C4:  Mild diffuse disc bulge.  No canal or foraminal stenosis.  C4-C5: Chronic intervertebral disc space narrowing with diffuse degenerative disc osteophyte, eccentric to the left. Broad posterior component flattens and partially effaces the ventral thecal sac and resultant moderate spinal stenosis. Mild cord flattening without cord signal changes. Severe left with moderate right C5 foraminal stenosis.  C5-C6: Chronic intervertebral disc space narrowing with diffuse degenerative disc osteophyte. Broad posterior component flattens and effaces the ventral thecal sac resultant  moderate spinal stenosis. Mild cord flattening without cord signal changes. Severe right with moderate left C6 foraminal stenosis.  C6-C7: Mild disc bulge with superimposed small central disc protrusion indents the ventral thecal sac. No significant spinal stenosis or cord deformity. Foramina remain patent.  C7-T1:  Normal interspace.  Mild facet hypertrophy.  No stenosis.  Visualized upper thoracic spine demonstrates mild disc bulging at T2-3 without significant stenosis.  IMPRESSION: MRI HEAD IMPRESSION:  Normal  brain MRI for age. No findings to explain patient's symptoms identified.  MRI CERVICAL SPINE IMPRESSION:  1. Normal MRI appearance of the cervical spinal cord. No cord signal changes to suggest myelopathy or other abnormality. 2. Degenerative disc osteophyte complexes at C4-5 and C5-6 with resultant moderate spinal stenosis and mild cord flattening. Associated severe left with moderate right C5 foraminal stenosis, with severe right and moderate left C6 foraminal narrowing.   Electronically Signed   By: Rise Mu M.D.   On: 09/19/2019 07:58   PMFS History: Patient Active Problem List   Diagnosis Date Noted  . Spinal stenosis of cervical region 12/28/2019  . Fatigue 08/20/2019  . Anxiety and depression 05/19/2017  . Left shoulder pain 07/19/2014  . RAYNAUD'S DISEASE 01/31/2007   Past Medical History:  Diagnosis Date  . Allergy   . Anxiety and depression    per patient diagnosed by psychiatrist  . Appendicitis 07/20/2012  . Blood in stool   . Chicken pox   . Depression    postpartum with one child  . GERD 01/31/2007   Qualifier: Diagnosis of  By: Charlsie Quest RMA, [name]    . Hyperlipidemia    postpartum  . PONV (postoperative nausea and vomiting)   . RAYNAUD'S DISEASE 01/31/2007   Qualifier: Diagnosis of  By: Samara Snide      Family History  Problem Relation Age of Onset  . Early menopause Mother   . Other Father        no contact since infant  . Raynaud syndrome Sister   . Cancer Maternal Grandmother        melanoma  . Stroke Maternal Grandmother 65  . Arthritis Maternal Grandmother   . Hyperlipidemia Maternal Grandmother   . Hypertension Maternal Grandmother   . Breast cancer Maternal Grandmother 72  . Cancer Maternal Grandfather        bladder  . Lung cancer Other        all but one of grandmother's siblings   . Lupus Maternal Uncle   . Multiple sclerosis Cousin   . Autism Son   . Healthy Son   . Healthy Son   . Healthy Son     Past  Surgical History:  Procedure Laterality Date  . CESAREAN SECTION     x3  . LAPAROSCOPIC APPENDECTOMY Right 07/20/2012   Procedure: APPENDECTOMY LAPAROSCOPIC;  Surgeon: Velora Heckler, MD;  Location: [facility];  Service: General;  Laterality: Right;  . WISDOM TOOTH EXTRACTION     Social History   Occupational History  . Not on file  Tobacco Use  . Smoking status: Never Smoker  . Smokeless tobacco: Never Used  Vaping Use  . Vaping Use: Never used  Substance and Sexual Activity  . Alcohol use: No  . Drug use: No  . Sexual activity: Yes    Birth control/protection: None    Comment: husband had vasectomy

## 2019-12-29 ENCOUNTER — Ambulatory Visit (INDEPENDENT_AMBULATORY_CARE_PROVIDER_SITE_OTHER): Payer: No Typology Code available for payment source | Admitting: Family Medicine

## 2019-12-29 ENCOUNTER — Other Ambulatory Visit (HOSPITAL_COMMUNITY)
Admission: RE | Admit: 2019-12-29 | Discharge: 2019-12-29 | Disposition: A | Discharge status: Home / Self Care | Payer: No Typology Code available for payment source | Source: Ambulatory Visit | Attending: Family Medicine | Admitting: Family Medicine

## 2019-12-29 ENCOUNTER — Encounter: Payer: Self-pay | Admitting: Family Medicine

## 2019-12-29 VITALS — BP 102/82 | HR 89 | Temp 98.1°F | Ht 62.5 in | Wt 150.0 lb

## 2019-12-29 DIAGNOSIS — Z0000 Encounter for general adult medical examination without abnormal findings: Secondary | ICD-10-CM | POA: Diagnosis not present

## 2019-12-29 DIAGNOSIS — R768 Other specified abnormal immunological findings in serum: Secondary | ICD-10-CM | POA: Diagnosis not present

## 2019-12-29 DIAGNOSIS — R5383 Other fatigue: Secondary | ICD-10-CM

## 2019-12-29 DIAGNOSIS — G43809 Other migraine, not intractable, without status migrainosus: Secondary | ICD-10-CM | POA: Diagnosis not present

## 2019-12-29 DIAGNOSIS — E785 Hyperlipidemia, unspecified: Secondary | ICD-10-CM

## 2019-12-29 DIAGNOSIS — Z1159 Encounter for screening for other viral diseases: Secondary | ICD-10-CM | POA: Diagnosis not present

## 2019-12-29 DIAGNOSIS — Z124 Encounter for screening for malignant neoplasm of cervix: Secondary | ICD-10-CM | POA: Diagnosis present

## 2019-12-29 NOTE — Progress Notes (Signed)
Lindsay Wong DOB: 11/10/1980 Encounter date: 12/29/2019  This is a 39 y.o. female who presents for complete physical   History of present illness/Additional concerns: States that this week is better than last. States that she can deal with the aches and pains, but the fatigue is really bad. Neck is bad, knees are bad. Just doesn't feel like things should be as bad without previous injury. Pain started lifting over weekend. Had gone for a couple of months without having some discomfort. She resumed walking 2 miles regularly. When on period has more back/hip aches. But last week couldn't make it up 6 steps without thighs burning. Has not noted joint swelling. Still has some blue skin in toes/comes and goes. Also has this in fingers.   On depression screening - feeling the severe fatigue is what causes the severe symptoms.   Past Medical History:  Diagnosis Date   Allergy    Anxiety and depression    per patient diagnosed by psychiatrist   Appendicitis 07/20/2012   Blood in stool    Chicken pox    Depression    postpartum with one child   GERD 01/31/2007   Qualifier: Diagnosis of  By: Charlsie Quest RMA, [name]     Hyperlipidemia    postpartum   PONV (postoperative nausea and vomiting)    RAYNAUD'S DISEASE 01/31/2007   Qualifier: Diagnosis of  By: Samara Snide     Past Surgical History:  Procedure Laterality Date   CESAREAN SECTION     x3   LAPAROSCOPIC APPENDECTOMY Right 07/20/2012   Procedure: APPENDECTOMY LAPAROSCOPIC;  Surgeon: Velora Heckler, MD;  Location: [facility];  Service: General;  Laterality: Right;   WISDOM TOOTH EXTRACTION     Allergies  Allergen Reactions   Clarithromycin Shortness Of Breath    REACTION: hives   Peg-2000 (Covid-19 Mrna Vaccine Component) Anaphylaxis   Latex     Skin irritation from gloves   Current Meds  Medication Sig   albuterol (VENTOLIN HFA) 108 (90 Base) MCG/ACT inhaler Inhale 2 puffs into the lungs every 4 (four) hours as needed for  wheezing or shortness of breath.   amLODipine (NORVASC) 5 MG tablet Take 1 tablet (5 mg total) by mouth daily.   cetirizine (ZYRTEC) 10 MG tablet Take 10 mg by mouth daily.   Cyanocobalamin (B-12 PO) Take by mouth daily.   DULoxetine (CYMBALTA) 30 MG capsule Take 30 mg by mouth every morning.   esomeprazole (NEXIUM) 40 MG capsule Take 1 capsule (40 mg total) by mouth daily.   famotidine (PEPCID) 20 MG tablet Take 1 tablet (20 mg total) by mouth 2 (two) times daily.   Ferrous Sulfate (IRON PO) Take by mouth 3 (three) times a week.   levocetirizine (XYZAL) 5 MG tablet Take 1 tablet (5 mg total) by mouth 2 (two) times daily as needed for allergies.   montelukast (SINGULAIR) 10 MG tablet Take 1 tablet (10 mg total) by mouth at bedtime.   naproxen sodium (ALEVE) 220 MG tablet Take 220 mg by mouth as needed.   VITAMIN D PO Take 2,000 Units by mouth.   Social History   Tobacco Use   Smoking status: Never Smoker   Smokeless tobacco: Never Used  Substance Use Topics   Alcohol use: No   Family History  Problem Relation Age of Onset   Early menopause Mother    Other Father        no contact since infant   Raynaud syndrome Sister  Cancer Maternal Grandmother        melanoma   Stroke Maternal Grandmother 12   Arthritis Maternal Grandmother    Hyperlipidemia Maternal Grandmother    Hypertension Maternal Grandmother    Breast cancer Maternal Grandmother 72   Cancer Maternal Grandfather        bladder   Lung cancer Other        all but one of grandmother's siblings    Lupus Maternal Uncle    Multiple sclerosis Cousin    Autism Son    Healthy Son    Healthy Son    Healthy Son      Review of Systems  Constitutional: Positive for fatigue. Negative for activity change, appetite change, chills, fever and unexpected weight change.  HENT: Negative for congestion, ear pain, hearing loss, sinus pressure, sinus pain, sore throat and trouble swallowing.   Eyes:  Negative for pain and visual disturbance.  Respiratory: Negative for cough, chest tightness, shortness of breath and wheezing.   Cardiovascular: Negative for chest pain, palpitations and leg swelling.  Gastrointestinal: Negative for abdominal pain, blood in stool, constipation, diarrhea, nausea and vomiting.  Genitourinary: Negative for difficulty urinating and menstrual problem.  Musculoskeletal: Positive for arthralgias and neck pain. Negative for back pain, joint swelling and neck stiffness.  Skin: Negative for rash.  Neurological: Negative for dizziness, weakness, numbness and headaches.  Hematological: Negative for adenopathy. Does not bruise/bleed easily.  Psychiatric/Behavioral: Negative for sleep disturbance and suicidal ideas. The patient is not nervous/anxious.     CBC:  Lab Results  Component Value Date   WBC 6.7 11/01/2019   HGB 13.3 11/01/2019   HCT 39.4 11/01/2019   MCH 29.4 11/01/2019   MCHC 33.8 11/01/2019   RDW 12.8 11/01/2019   PLT 306 11/01/2019   MPV 11.2 11/01/2019   CMP: Lab Results  Component Value Date   NA 138 11/01/2019   K 4.0 11/01/2019   CL 102 11/01/2019   CO2 27 11/01/2019   GLUCOSE 91 11/01/2019   BUN 6 (L) 11/01/2019   CREATININE 0.89 11/01/2019   GFRAA 93 11/01/2019   CALCIUM 10.2 11/01/2019   PROT 7.5 11/01/2019   BILITOT 0.8 11/01/2019   ALKPHOS 89 07/17/2018   ALT 15 11/01/2019   AST 15 11/01/2019   LIPID: Lab Results  Component Value Date   CHOL 234 (H) 03/01/2019   TRIG 123.0 03/01/2019   HDL 47.80 03/01/2019   LDLCALC 161 (H) 03/01/2019    Objective:  BP 102/82 (BP Location: Left Arm, Patient Position: Sitting, Cuff Size: Normal)    Pulse 89    Temp 98.1 F (36.7 C) (Oral)    Ht 5' 2.5" (1.588 m)    Wt 150 lb (68 kg)    LMP 12/13/2019 (Exact Date)    BMI 27.00 kg/m   Weight: 150 lb (68 kg)   BP Readings from Last 3 Encounters:  12/29/19 102/82  12/28/19 95/65  11/24/19 126/87   Wt Readings from Last 3 Encounters:   12/29/19 150 lb (68 kg)  12/28/19 153 lb (69.4 kg)  11/24/19 153 lb 9.6 oz (69.7 kg)    Physical Exam Exam conducted with a chaperone present.  Constitutional:      General: She is not in acute distress.    Appearance: She is well-developed.  HENT:     Head: Normocephalic and atraumatic.     Right Ear: External ear normal.     Left Ear: External ear normal.     Mouth/Throat:  Pharynx: No oropharyngeal exudate.  Eyes:     Conjunctiva/sclera: Conjunctivae normal.     Pupils: Pupils are equal, round, and reactive to light.  Neck:     Thyroid: No thyromegaly.  Cardiovascular:     Rate and Rhythm: Normal rate and regular rhythm.     Heart sounds: Normal heart sounds. No murmur heard.  No friction rub. No gallop.   Pulmonary:     Effort: Pulmonary effort is normal.     Breath sounds: Normal breath sounds.  Abdominal:     General: Bowel sounds are normal. There is no distension.     Palpations: Abdomen is soft. There is no mass.     Tenderness: There is no abdominal tenderness. There is no guarding.     Hernia: No hernia is present.  Genitourinary:    General: Normal vulva.     Exam position: Supine.     Labia:        Right: No rash or tenderness.        Left: No rash or tenderness.      Vagina: Normal.     Cervix: Normal.     Uterus: Normal.      Adnexa: Right adnexa normal and left adnexa normal.     Comments: anteroflexed Musculoskeletal:        General: No tenderness or deformity. Normal range of motion.     Cervical back: Normal range of motion and neck supple.  Lymphadenopathy:     Cervical: No cervical adenopathy.  Skin:    General: Skin is warm and dry.     Findings: No rash.  Neurological:     Mental Status: She is alert and oriented to person, place, and time.     Deep Tendon Reflexes: Reflexes normal.     Reflex Scores:      Tricep reflexes are 2+ on the right side and 2+ on the left side.      Bicep reflexes are 2+ on the right side and 2+ on the  left side.      Brachioradialis reflexes are 2+ on the right side and 2+ on the left side.      Patellar reflexes are 2+ on the right side and 2+ on the left side. Psychiatric:        Speech: Speech normal.        Behavior: Behavior normal.        Thought Content: Thought content normal.     Assessment/Plan: Health Maintenance Due  Topic Date Due   Hepatitis C Screening  Never done   PAP SMEAR-Modifier  09/20/2019   INFLUENZA VACCINE  12/12/2019   Health Maintenance reviewed.  1. Preventative health care Keep up with regular walking, healthy eating. We discussed importance of cholesterol control, esp if underlying inflammatory condition.   2. Encounter for hepatitis C screening test for low risk patient - Hepatitis C antibody  3. Other migraine without status migrainosus, not intractable - Hepatitis C antibody - Magnesium, RBC  4. Positive ANA (antinuclear antibody) Following with rheumatology. Discussed with patient what Dr. Corliss Skainseveshwar had mentioned; that consideration of plaquenil trial might be considered given fatigue, joint pain with positive ANA. Asked patient to consider. Will let her know when bloodwork returns. She did mention increase dry eyes/mouth last week when sx were more severe. Has one small split in nasal mucosa that has been tender incidentally. - C-reactive protein - Sedimentation rate  5. Hyperlipidemia, unspecified hyperlipidemia type - Lipid panel  6. Fatigue,  unspecified type See above - Iron, TIBC and Ferritin Panel  7. Cervical cancer screening - PAP [[place]]   Return for pending labs.  Theodis Shove, MD

## 2019-12-29 NOTE — Telephone Encounter (Signed)
Per Lindsay Wong in the lab, she will add the test tomorrow as the lab has already picked up the specimens for today.  Message sent to PCP.

## 2020-01-03 LAB — CYTOLOGY - PAP
Comment: NEGATIVE
Diagnosis: UNDETERMINED — AB
High risk HPV: NEGATIVE

## 2020-01-03 MED FILL — AMLODIPINE BESYLATE 5 MG TA: 5 | 30 days supply | Qty: 30 | Fill #2

## 2020-01-03 MED FILL — LEVOCETIRIZINE 5 MG TABLET: 5 | 30 days supply | Qty: 60 | Fill #1

## 2020-01-04 LAB — IRON,TIBC AND FERRITIN PANEL
%SAT: 23 % (calc) (ref 16–45)
Ferritin: 23 ng/mL (ref 16–232)
Iron: 81 ug/dL (ref 40–190)
TIBC: 355 mcg/dL (calc) (ref 250–450)

## 2020-01-04 LAB — LIPID PANEL
Cholesterol: 276 mg/dL — ABNORMAL HIGH (ref <200)
HDL: 51 mg/dL (ref >=50)
LDL Cholesterol (Calc): 201 mg/dL (calc) — ABNORMAL HIGH
Non-HDL Cholesterol (Calc): 225 mg/dL (calc) — ABNORMAL HIGH (ref <130)
Total CHOL/HDL Ratio: 5.4 (calc) — ABNORMAL HIGH (ref <5.0)
Triglycerides: 110 mg/dL (ref <150)

## 2020-01-04 LAB — ANTI-NUCLEAR AB-TITER (ANA TITER): ANA Titer 1: 1:320 {titer} — ABNORMAL HIGH

## 2020-01-04 LAB — TEST AUTHORIZATION

## 2020-01-04 LAB — HEPATITIS C ANTIBODY
Hepatitis C Ab: NONREACTIVE
SIGNAL TO CUT-OFF: 0.01 (ref <1.00)

## 2020-01-04 LAB — SEDIMENTATION RATE: Sed Rate: 25 mm/h — ABNORMAL HIGH (ref 0–20)

## 2020-01-04 LAB — C-REACTIVE PROTEIN: CRP: 6.6 mg/L (ref <8.0)

## 2020-01-04 LAB — ANA: Anti Nuclear Antibody (ANA): POSITIVE — AB

## 2020-01-04 LAB — MAGNESIUM, RBC: Magnesium RBC: 4.5 mg/dL (ref 4.0–6.4)

## 2020-01-12 ENCOUNTER — Other Ambulatory Visit: Payer: Self-pay | Admitting: Family Medicine

## 2020-01-12 HISTORY — PX: COLONOSCOPY: SHX174

## 2020-01-12 NOTE — Telephone Encounter (Signed)
Patient has been scheduled in [facility] 9/17 at 8:30am for component testing. Informed it is a 3-4 hour testing, no antihistamines for 3 days prior, no additional visitors and to wear a mask.

## 2020-01-13 MED FILL — ESOMEPRAZOLE MAG DR 40 MG C: 40 | 30 days supply | Qty: 30 | Fill #0

## 2020-01-20 MED FILL — DULoxetine HCL 30 MG CPEP: 30 | 30 days supply | Qty: 60 | Fill #0

## 2020-01-21 ENCOUNTER — Encounter: Payer: Self-pay | Admitting: Family Medicine

## 2020-01-21 DIAGNOSIS — K921 Melena: Secondary | ICD-10-CM

## 2020-01-26 NOTE — Telephone Encounter (Signed)
Referral coordinator was informed of the message below and stated she left a message on the voicemail for  GI requesting an urgent appt for the pt.  Message sent to PCP.

## 2020-01-27 DIAGNOSIS — T50B95D Adverse effect of other viral vaccines, subsequent encounter: Secondary | ICD-10-CM | POA: Insufficient documentation

## 2020-01-27 NOTE — Progress Notes (Signed)
Follow Up Note  RE: Lindsay Wong MRN: [id number] DOB: Dec 20, 1980 Date of Office Visit: 01/28/2020  Referring provider: Wynn Banker, MD Primary care provider: Wynn Banker, MD  Chief Complaint: Allergy Testing (Covid Component Testing)  Assessment and Plan: Lindsay Wong is a 39 y.o. female with: Adverse effect of other viral vaccines, subsequent encounter Throat tightness and difficulty swallowing 8 minutes after first Pfizer vaccine on 08/02/2019. Treated with Epinephrine with good benefit. No other reactions to vaccines.  Today's skin prick and intradermal testing was negative to Miralax (source of PEG 3350), methylprednisolone acetate (source of PEG 3350), and triamcinolone acetonide (source of polysorbate-80).  J&J vaccine given today with 30 minute wait period. Patient tolerated the vaccine with no issues.  For next 24 hours monitor for hives, swelling, shortness of breath and dizziness. If you see these symptoms, use Benadryl 50mg  for mild symptoms.  If symptoms worsen or if you have severe symptoms including breathing issues, throat closure, significant swelling, whole body hives, severe diarrhea and vomiting, lightheadedness then seek immediate medical care.  I still recommend that she avoids Pfizer or Moderna for now as she did have what seems like an IgE mediated reaction to which is an [employer] based vaccine like Moderna. As of now patient is fully vaccinated if she requires any future boosters advised her to contact our office.  Plan: Challenge drug: J&J Challenge as per protocol: Passed  History of Present Illness: I had the pleasure of seeing Lindsay Wong for a follow up visit at the [facility] on 01/28/2020. She is a 39 y.o. female, who is being followed for chronic hives, allergic rhinitic and reactive airway disease. Her previous allergy office visit was on 11/03/2019 with Dr. 11/05/2019. Today she is here for COVID-19 vaccine component  testing and challenge.   History of Reaction:  Patient received her first Pfizer injection on 08/02/2019 and within 8 minutes she felt throat tightness and unable to swallow. She also felt some tingling in her throat. She received Epipen and within minutes she felt better.  She was given a 7 day course of prednisone due to facial erythema.   Any known reactions to polyethylene glycol or polysorbate?  Not that patient she knows of.   Any history of anaphylaxis to vaccinations?  No prior reactions to vaccines.   Any history of reactions to injectable medications? No recent injectable medications.   Any history of anaphylaxis to colonoscopy preps (i.e.Miralax)? No prior Miralax ingestion.   Any history of dermal filler treatments in the last year? No  08/03/2019 PCP visit: "Lindsay Wong is a 39 y.o. female who is coming in today for the above mentioned reasons.  She went to get her first Covid vaccine yesterday.  About 10 minutes afterwards, while still in her 15-minute observation period, she started describing tingling around her neck and site of injection, soon had difficulty swallowing and trouble breathing.  She notified staff at the vaccination center who injected her with epinephrine and contacted 911.  She was sent to an ED in [place] where she was given Solu-Medrol and observed for 8 hours and was subsequently discharged home.  This morning she feels improved.  No longer has shortness of breath.  She still describes tingling of her arm at the site of injection but otherwise feels well."  08/02/2019 ER visit: "HPI patient is a healthy 39 year old female who presents by EMS for an allergic reaction. Patient had her first dose of the  5 vaccine this morning. She states about 10 minutes after the vaccine she felt like her tongue was swelling and her throat was closing up. She went and told the person in charge and she was given epinephrine. She states she had almost immediate resolution  of her symptoms. Patient denies urticaria, wheezing, shortness of breath, history of anaphylaxis."  Interval History: Patient has not been ill, she has not had any accidental exposures to the culprit medication.   Recent/Current History: Pulmonary disease: no Cardiac disease: no Respiratory infection: no Rash: no Itch: yes - at baseline Swelling: no Cough: no Shortness of breath: no Runny/stuffy nose: no Itchy eyes: no Beta-blocker use: no  Patient/guardian was informed of the test procedure with verbalized understanding of the risk of anaphylaxis. Consent was signed.   Last antihistamine use: none in the past 3 days Last beta-blocker use: n/a  Medication List:  Current Outpatient Medications  Medication Sig Dispense Refill  . albuterol (VENTOLIN HFA) 108 (90 Base) MCG/ACT inhaler Inhale 2 puffs into the lungs every 4 (four) hours as needed for wheezing or shortness of breath. 18 g 1  . amLODipine (NORVASC) 5 MG tablet Take 1 tablet (5 mg total) by mouth daily. 30 tablet 3  . cetirizine (ZYRTEC) 10 MG tablet Take 10 mg by mouth daily.    . Cyanocobalamin (B-12 PO) Take by mouth daily.    . DULoxetine (CYMBALTA) 30 MG capsule Take 30 mg by mouth every morning.    Marland Kitchen esomeprazole (NEXIUM) 40 MG capsule TAKE 1 CAPSULE (40 MG TOTAL) BY MOUTH DAILY. 30 capsule 3  . famotidine (PEPCID) 20 MG tablet Take 1 tablet (20 mg total) by mouth 2 (two) times daily. 60 tablet 5  . Ferrous Sulfate (IRON PO) Take by mouth 3 (three) times a week.    . levocetirizine (XYZAL) 5 MG tablet Take 1 tablet (5 mg total) by mouth 2 (two) times daily as needed for allergies. 60 tablet 5  . montelukast (SINGULAIR) 10 MG tablet Take 1 tablet (10 mg total) by mouth at bedtime. 30 tablet 5  . VITAMIN D PO Take 2,000 Units by mouth.    . naproxen sodium (ALEVE) 220 MG tablet Take 220 mg by mouth as needed. (Patient not taking: Reported on 01/28/2020)     No current facility-administered medications for this visit.    Allergies: Allergies  Allergen Reactions  . Clarithromycin Shortness Of Breath    REACTION: hives  . Peg-2000 (Covid-19 Mrna Vaccine Component) Anaphylaxis  . Latex     Skin irritation from gloves   I reviewed her past medical history, social history, family history, and environmental history and no significant changes have been reported from her previous visit.  Review of Systems  Constitutional: Negative for appetite change, chills, fever and unexpected weight change.  HENT: Negative for congestion and rhinorrhea.   Eyes: Negative for itching.  Respiratory: Negative for cough, chest tightness, shortness of breath and wheezing.   Gastrointestinal: Negative for abdominal pain.  Skin: Positive for rash.  Neurological: Negative for headaches.   Objective: BP 120/74   Pulse 82   Temp 97.6 F (36.4 C) (Temporal)   Resp 18   Ht 5\' 3"  (1.6 m)   Wt 149 lb (67.6 kg)   SpO2 100%   BMI 26.39 kg/m  Body mass index is 26.39 kg/m. Physical Exam Vitals and nursing note reviewed.  Constitutional:      Appearance: Normal appearance. She is well-developed.  HENT:     Head:  Normocephalic and atraumatic.     Right Ear: External ear normal.     Left Ear: External ear normal.     Nose: Nose normal.     Mouth/Throat:     Mouth: Mucous membranes are moist.     Pharynx: Oropharynx is clear.  Eyes:     Conjunctiva/sclera: Conjunctivae normal.  Cardiovascular:     Rate and Rhythm: Normal rate and regular rhythm.     Heart sounds: Normal heart sounds. No murmur heard.   Pulmonary:     Effort: Pulmonary effort is normal.     Breath sounds: Normal breath sounds. No wheezing, rhonchi or rales.  Musculoskeletal:     Cervical back: Neck supple.  Skin:    General: Skin is warm.     Findings: Rash present.     Comments: Biopsy scars on right forearm  Neurological:     Mental Status: She is alert and oriented to person, place, and time.  Psychiatric:        Behavior: Behavior normal.       Diagnostics: Results discussed with patient/family.  COVID Vaccine Testing - 01/28/20 1217      Test Information   Consent Yes    Medications Miralax    Triamcinolone Lot # [id number]    Triamcinolone EXP DATE 03/13/21    Methylprednisolone Lot # [id number]    Methylprednisolone EXP DATE 07/11/20    Miralax Lot # [id number]    Miralax EXP DATE 09/10/20      Pre Test Vitals   BP 120/74    Pulse 82    Resp 18      SKIN PRICK TESTING - Arm #1   Location Right Arm    Select Select      HISTAMINE (1mg /mL) Skin Prick Arm #1   Histamine Time Testing Placed 0825    Histamine Wheal 2+      Control (negative - HSA) Skin Prick Arm #1   Control Time Testing Placed 0825    Control Wheal Negative      Triamcinolone (40mg /mL) Skin Prick Arm #1   Triamcinolone Time Testing Placed 0825    Triamcinolone Wheal --   +/-     Methylprednisolone (40mg /mL) Skin Prick Arm #1   Methylprednisolone Time Testing Placed 0825    Methylprednisolone Wheal Negative      Miralax (1:100 or 1.7 mg/mL) Skin Prick Arm #1   Miralax Time Testing Placed 0825    Miralax Wheal Negative      Miralax (1:10 or 17mg /mL) Skin Prick Arm #1   Miralax Time Testing Placed 0851    Miralax Wheal Negative      Miralax (1:1 or 170mg /mL) Skin Prick Arm #1   Miralax Time Testing Placed 0914    Miralax Wheal Negative      INTRADERMAL TESTING - Arm #2   Location Left Arm    Select Select      Control (negative - HSA) Intradermal Arm #2   Control Time Testing Placed  0851    Control Wheal Negative      Triamcinolone (1:100) Intradermal Arm #2   Triamcinolone Time Testing Placed 0851    Triamcinolone Wheal Negative      Methylprednisolone (1:100) Intradermal Arm #2   Methylprednisolone Time Testing Placed  0851    Methylprednisolone Wheal Negative      Triamcinolone (1:10) Intradermal Arm #2   Triamcinolone Time Testing Placed 0914    Triamcinolone Wheal Negative      Methylprednisolone (1:10) Intradermal Arm  #  2   Methylprednisolone Time Testing Placed  0914    Methylprednisolone Wheal Negative      Triamcinolone (1:1) Intradermal Arm #2   Triamcinolone Time Testing Placed 0931    Triamcinolone Wheal Negative      Skin Prick/Intradermal Post Testing   Skin Prick/Intradermal Testing Total Pricks 13      Post Test Vitals   BP 124/70    Pulse 84    Resp 17           Previous notes and tests were reviewed. The plan was reviewed with the patient/family, and all questions/concerned were addressed.  It was my pleasure to see Lindsay Wong today and participate in her care. Please feel free to contact me with any questions or concerns.  Sincerely,  Wyline MoodYoon Clemie General, DO Allergy & Immunology  [facility] [place] office: [phone] [facility] [place] [place] office:[phone] [place] office: [phone]

## 2020-01-28 ENCOUNTER — Encounter: Payer: Self-pay | Admitting: Allergy

## 2020-01-28 ENCOUNTER — Ambulatory Visit: Payer: No Typology Code available for payment source

## 2020-01-28 ENCOUNTER — Other Ambulatory Visit: Payer: Self-pay

## 2020-01-28 ENCOUNTER — Ambulatory Visit: Payer: No Typology Code available for payment source | Admitting: Allergy

## 2020-01-28 VITALS — BP 120/74 | HR 82 | Temp 97.6°F | Resp 18 | Ht 63.0 in | Wt 149.0 lb

## 2020-01-28 DIAGNOSIS — Z23 Encounter for immunization: Secondary | ICD-10-CM | POA: Diagnosis not present

## 2020-01-28 DIAGNOSIS — T50B95D Adverse effect of other viral vaccines, subsequent encounter: Secondary | ICD-10-CM

## 2020-01-28 NOTE — Patient Instructions (Addendum)
   Today's skin prick and intradermal testing was negative to Miralax (source of PEG 3350), methylprednisolone acetate (source of PEG 3350), and triamcinolone acetonide (source of polysorbate-80).   For next 24 hours monitor for hives, swelling, shortness of breath and dizziness. If you see these symptoms, use Benadryl 50mg  for mild symptoms.    If symptoms worsen or if you have severe symptoms including breathing issues, throat closure, significant swelling, whole body hives, severe diarrhea and vomiting, lightheadedness then seek immediate medical care.   You received J&J vaccine today.   I still recommend that you avoid Pfizer or Moderna for now as you did have an allergic reaction based on your history.

## 2020-01-28 NOTE — Progress Notes (Signed)
° °  Covid-19 Vaccination Clinic  Name:  Lindsay Wong    MRN: [id number] DOB: [birth year]  01/28/2020  [name] was observed post Covid-19 immunization for 30 minutes based on pre-vaccination screening without incident. She was provided with Vaccine Information Sheet and instruction to access the V-Safe system.   [name] was instructed to call 911 with any severe reactions post vaccine:  Difficulty breathing   Swelling of face and throat   A fast heartbeat   A bad rash all over body   Dizziness and weakness   Immunizations Administered    Name Date Dose VIS Date Route   JANSSEN COVID-19 VACCINE 01/28/2020 10:00 AM 0.5 mL 07/10/2019 Intramuscular   Manufacturer: [employer]   Lot: [id number]   NDC: 03212-248-25

## 2020-01-28 NOTE — Assessment & Plan Note (Signed)
Throat tightness and difficulty swallowing 8 minutes after first Pfizer vaccine on 08/02/2019. Treated with Epinephrine with good benefit. No other reactions to vaccines.  Today's skin prick and intradermal testing was negative to Miralax (source of PEG 3350), methylprednisolone acetate (source of PEG 3350), and triamcinolone acetonide (source of polysorbate-80).  J&J vaccine given today with 30 minute wait period. Patient tolerated the vaccine with no issues.  For next 24 hours monitor for hives, swelling, shortness of breath and dizziness. If you see these symptoms, use Benadryl 50mg  for mild symptoms.  If symptoms worsen or if you have severe symptoms including breathing issues, throat closure, significant swelling, whole body hives, severe diarrhea and vomiting, lightheadedness then seek immediate medical care.  I still recommend that she avoids Pfizer or Moderna for now as she did have what seems like an IgE mediated reaction to which is an [employer] based vaccine like Moderna. As of now patient is fully vaccinated if she requires any future boosters advised her to contact our office.

## 2020-01-31 ENCOUNTER — Encounter: Payer: Self-pay | Admitting: Internal Medicine

## 2020-01-31 ENCOUNTER — Encounter: Payer: Self-pay | Admitting: *Deleted

## 2020-02-01 ENCOUNTER — Encounter: Payer: Self-pay | Admitting: *Deleted

## 2020-02-01 ENCOUNTER — Ambulatory Visit: Payer: No Typology Code available for payment source | Admitting: Internal Medicine

## 2020-02-01 VITALS — BP 110/72 | HR 98 | Ht 63.0 in | Wt 150.4 lb

## 2020-02-01 DIAGNOSIS — R194 Change in bowel habit: Secondary | ICD-10-CM

## 2020-02-01 DIAGNOSIS — K625 Hemorrhage of anus and rectum: Secondary | ICD-10-CM | POA: Diagnosis not present

## 2020-02-01 DIAGNOSIS — R6889 Other general symptoms and signs: Secondary | ICD-10-CM

## 2020-02-01 MED ORDER — SUPREP BOWEL PREP KIT 17.5-3.13-1.6 GM/177ML PO SOLN: 1.0000 | ORAL | 0 refills | Status: DC

## 2020-02-01 MED FILL — SUPREP BOWEL PREP KIT: 17.5-3.13-1 | 1 days supply | Qty: 354 | Fill #0

## 2020-02-01 NOTE — Progress Notes (Signed)
Patient ID: Lindsay Wong, female   DOB: 04-26-1981, 39 y.o.   MRN: [id number] HPI: Lindsay Wong is a 39 year old female with PMH of GERD, anxiety and depression, prior appendicitis in 2014, history of fatty liver by imaging who is seen in consult at the request of Dr. Ethlyn Gallery to evaluate rectal bleeding and change in bowel habit.  She is here alone today.  She reports that over the last 9 to 10 months she has had a change in her bowel pattern.  Prior to January 2021 her bowel pattern had been regular with normal formed brown stool.  Then around January 2021 her frequency increased and she noticed either Moshe or pencil thin stools.  This seemed to last for several months until a couple months ago when she noticed that her bowel movements seem to slow down and become less frequent.  And then over the last several weeks she has noticed bright red blood on the stool and in the toilet.  There is times when it hurts to pass her bowel movements and she feels like the tissue in the perianal area is inflamed.  She has had some lower abdominal cramping and abdominal bloating symptom.  No weight loss.  She has been taking MiraLAX most recently to help loosen the stools and with this stools have been easier and less painful to pass.  She does have a history of anemia and takes iron 3 times a week.  She is still menstruating and cycles are regular though at times heavy.  She has a history of reflux which is been present on and off for years though this year it has become again more problematic.  She has been taking Nexium 40 mg daily with good control of reflux symptoms.  She is seeing Dr. Estanislado Pandy with rheumatology.  She had a positive ANA and elevated ESR.  No definitive rheumatologic diagnosis has been made to date  Past Medical History:  Diagnosis Date  . Allergy   . Anxiety and depression    per patient diagnosed by psychiatrist  . Appendicitis 07/20/2012  . Blood in stool   . Chicken pox   . Depression     postpartum with one child  . Fatty liver   . GERD 01/31/2007   Qualifier: Diagnosis of  By: Marca Ancona RMA, [name]    . Hyperlipidemia    postpartum  . PONV (postoperative nausea and vomiting)   . Positive ANA (antinuclear antibody)   . RAYNAUD'S DISEASE 01/31/2007   Qualifier: Diagnosis of  By: Marca Ancona RMA, [name]    . Urticaria     Past Surgical History:  Procedure Laterality Date  . CESAREAN SECTION     x3  . LAPAROSCOPIC APPENDECTOMY Right 07/20/2012   Procedure: APPENDECTOMY LAPAROSCOPIC;  Surgeon: Earnstine Regal, MD;  Location: [facility];  Service: General;  Laterality: Right;  . WISDOM TOOTH EXTRACTION      Outpatient Medications Prior to Visit  Medication Sig Dispense Refill  . albuterol (VENTOLIN HFA) 108 (90 Base) MCG/ACT inhaler Inhale 2 puffs into the lungs every 4 (four) hours as needed for wheezing or shortness of breath. 18 g 1  . amLODipine (NORVASC) 5 MG tablet Take 1 tablet (5 mg total) by mouth daily. 30 tablet 3  . Cyanocobalamin (B-12 PO) Take by mouth daily.    . DULoxetine (CYMBALTA) 30 MG capsule Take 30 mg by mouth every morning.    Marland Kitchen esomeprazole (NEXIUM) 40 MG capsule TAKE 1 CAPSULE (40 MG TOTAL)  BY MOUTH DAILY. 30 capsule 3  . famotidine (PEPCID) 20 MG tablet Take 1 tablet (20 mg total) by mouth 2 (two) times daily. 60 tablet 5  . Ferrous Sulfate (IRON PO) Take by mouth 3 (three) times a week.    . levocetirizine (XYZAL) 5 MG tablet Take 1 tablet (5 mg total) by mouth 2 (two) times daily as needed for allergies. 60 tablet 5  . montelukast (SINGULAIR) 10 MG tablet Take 1 tablet (10 mg total) by mouth at bedtime. 30 tablet 5  . naproxen sodium (ALEVE) 220 MG tablet Take 220 mg by mouth as needed.     Marland Kitchen VITAMIN D PO Take 2,000 Units by mouth daily.     . cetirizine (ZYRTEC) 10 MG tablet Take 10 mg by mouth daily. (Patient not taking: Reported on 02/01/2020)     No facility-administered medications prior to visit.    Allergies  Allergen Reactions  . Clarithromycin  Shortness Of Breath    REACTION: hives  . Peg-2000 (Covid-19 Mrna Vaccine Component) Anaphylaxis  . Latex     Skin irritation from gloves    Family History  Problem Relation Age of Onset  . Early menopause Mother   . Other Father        no contact since infant  . Raynaud syndrome Sister   . Cancer Maternal Grandmother        melanoma  . Stroke Maternal Grandmother 69  . Arthritis Maternal Grandmother   . Hyperlipidemia Maternal Grandmother   . Hypertension Maternal Grandmother   . Breast cancer Maternal Grandmother 72  . Cancer Maternal Grandfather        bladder  . Bladder Cancer Maternal Grandfather   . Lung cancer Other        all but one of grandmother's siblings   . Lupus Maternal Uncle   . Leukemia Maternal Uncle   . Multiple sclerosis Cousin   . Autism Son   . Healthy Son   . Healthy Son   . Healthy Son   . Immunodeficiency Maternal Aunt     Social History   Tobacco Use  . Smoking status: Never Smoker  . Smokeless tobacco: Never Used  Vaping Use  . Vaping Use: Never used  Substance Use Topics  . Alcohol use: No  . Drug use: No    ROS: As per history of present illness, otherwise negative  BP 110/72 (BP Location: Right Arm, Patient Position: Sitting, Cuff Size: Normal)   Pulse 98   Ht 5' 3"  (1.6 m)   Wt 150 lb 6 oz (68.2 kg)   BMI 26.64 kg/m  Constitutional: Well-developed and well-nourished. No distress. HEENT: Normocephalic and atraumatic.   No scleral icterus. Neck: Neck supple. Trachea midline. Cardiovascular: Normal rate, regular rhythm and intact distal pulses. No M/R/G Pulmonary/chest: Effort normal and breath sounds normal. No wheezing, rales or rhonchi. Abdominal: Soft, nontender, nondistended. Bowel sounds active throughout. No hepatosplenomegaly. Rectal: Normal external exam, no visible or palpable fissure, in the posterior distal rectum there is a nodular area of mucosa, exam was not overly tender or painful for the patient Extremities:  no clubbing, cyanosis, or edema Neurological: Alert and oriented to person place and time. Skin: Skin is warm and dry.  Psychiatric: Normal mood and affect. Behavior is normal.  RELEVANT LABS AND IMAGING: CBC    Component Value Date/Time   WBC 6.7 11/01/2019 1012   RBC 4.53 11/01/2019 1012   HGB 13.3 11/01/2019 1012   HCT 39.4 11/01/2019 1012  PLT 306 11/01/2019 1012   MCV 87.0 11/01/2019 1012   MCH 29.4 11/01/2019 1012   MCHC 33.8 11/01/2019 1012   RDW 12.8 11/01/2019 1012   LYMPHSABS 2,017 11/01/2019 1012   MONOABS 0.4 07/27/2019 0741   EOSABS 87 11/01/2019 1012   BASOSABS 40 11/01/2019 1012    CMP     Component Value Date/Time   NA 138 11/01/2019 1012   K 4.0 11/01/2019 1012   CL 102 11/01/2019 1012   CO2 27 11/01/2019 1012   GLUCOSE 91 11/01/2019 1012   BUN 6 (L) 11/01/2019 1012   CREATININE 0.89 11/01/2019 1012   CALCIUM 10.2 11/01/2019 1012   PROT 7.5 11/01/2019 1012   ALBUMIN 4.5 07/17/2018 0833   AST 15 11/01/2019 1012   ALT 15 11/01/2019 1012   ALKPHOS 89 07/17/2018 0833   BILITOT 0.8 11/01/2019 1012   GFRNONAA 81 11/01/2019 1012   GFRAA 93 11/01/2019 1012    ASSESSMENT/PLAN: 39 year old female with PMH of GERD, anxiety and depression, prior appendicitis in 2014, history of fatty liver by imaging who is seen in consult at the request of Dr. Ethlyn Gallery to evaluate rectal bleeding and change in bowel habit.   1.  Rectal bleeding/change in bowel habits/abnormal digital rectal exam --colonoscopy is recommended as soon as possible.  Next Tuesday for colonoscopy will work with her schedule.  We will prep with Suprep.  We reviewed the risk, benefits and alternatives and she is agreeable wishes to proceed.  Rule out polyp, mass lesion, colitis particularly in the setting of her probable yet to be confirmed autoimmune condition and also of note she had sacroiliitis visible on a prior CT scan years ago.      WH:[id number], [address], [address]  [place],  [place] [id number]

## 2020-02-01 NOTE — Patient Instructions (Signed)
You have been scheduled for a colonoscopy. Please follow written instructions given to you at your visit today.  Please pick up your prep supplies at the pharmacy within the next 1-3 days. If you use inhalers (even only as needed), please bring them with you on the day of your procedure.  If you are age 41 or older, your body mass index should be between 23-30. Your Body mass index is 26.64 kg/m. If this is out of the aforementioned range listed, please consider follow up with your Primary Care Provider.  If you are age 21 or younger, your body mass index should be between 19-25. Your Body mass index is 26.64 kg/m. If this is out of the aformentioned range listed, please consider follow up with your Primary Care Provider.   Due to recent changes in healthcare laws, you may see the results of your imaging and laboratory studies on MyChart before your provider has had a chance to review them.  We understand that in some cases there may be results that are confusing or concerning to you. Not all laboratory results come back in the same time frame and the provider may be waiting for multiple results in order to interpret others.  Please give [place] 48 hours in order for your provider to thoroughly review all the results before contacting the office for clarification of your results.

## 2020-02-07 ENCOUNTER — Encounter: Payer: Self-pay | Admitting: Certified Registered Nurse Anesthetist

## 2020-02-07 MED FILL — ESOMEPRAZOLE MAG DR 40 MG C: 40 | 30 days supply | Qty: 30 | Fill #1

## 2020-02-08 ENCOUNTER — Ambulatory Visit (AMBULATORY_SURGERY_CENTER): Payer: No Typology Code available for payment source | Admitting: Internal Medicine

## 2020-02-08 ENCOUNTER — Other Ambulatory Visit: Payer: Self-pay

## 2020-02-08 ENCOUNTER — Encounter: Payer: Self-pay | Admitting: Internal Medicine

## 2020-02-08 VITALS — BP 129/83 | HR 77 | Temp 97.5°F | Resp 15 | Ht 63.0 in | Wt 150.0 lb

## 2020-02-08 DIAGNOSIS — R194 Change in bowel habit: Secondary | ICD-10-CM | POA: Diagnosis not present

## 2020-02-08 DIAGNOSIS — K625 Hemorrhage of anus and rectum: Secondary | ICD-10-CM | POA: Diagnosis not present

## 2020-02-08 DIAGNOSIS — K6289 Other specified diseases of anus and rectum: Secondary | ICD-10-CM | POA: Diagnosis not present

## 2020-02-08 DIAGNOSIS — K620 Anal polyp: Secondary | ICD-10-CM

## 2020-02-08 MED ORDER — SODIUM CHLORIDE 0.9 % IV SOLN: 500.0000 mL | Freq: Once | INTRAVENOUS | Status: DC

## 2020-02-08 NOTE — Op Note (Signed)
[facility] Patient Name: Lindsay Wong Procedure Date: 02/08/2020 11:14 AM MRN: [id number] Endoscopist: Beverley FiedlerJay M Brixon Zhen , MD Age: [age] Referring MD:  Date of Birth: March 10, 1981 Gender: Female Account #: [id number] Procedure:                Colonoscopy Indications:              Rectal bleeding, Abnormal rectal exam, Change in                            bowel habits Medicines:                Monitored Anesthesia Care Procedure:                Pre-Anesthesia Assessment:                           - Prior to the procedure, a History and Physical                            was performed, and patient medications and                            allergies were reviewed. The patient's tolerance of                            previous anesthesia was also reviewed. The risks                            and benefits of the procedure and the sedation                            options and risks were discussed with the patient.                            All questions were answered, and informed consent                            was obtained. Prior Anticoagulants: The patient has                            taken no previous anticoagulant or antiplatelet                            agents. ASA Grade Assessment: II - A patient with                            mild systemic disease. After reviewing the risks                            and benefits, the patient was deemed in                            satisfactory condition to undergo the procedure.  After obtaining informed consent, the colonoscope                            was passed under direct vision. Throughout the                            procedure, the patient's blood pressure, pulse, and                            oxygen saturations were monitored continuously. The                            Colonoscope was introduced through the anus and                            advanced to the cecum, identified by appendiceal                             orifice and ileocecal valve. The colonoscopy was                            performed without difficulty. The patient tolerated                            the procedure well. The quality of the bowel                            preparation was good after copious irrigation and                            lavage. The ileocecal valve, appendiceal orifice,                            and rectum were photographed. Scope In: 11:28:07 AM Scope Out: 11:52:35 AM Scope Withdrawal Time: 0 hours 19 minutes 53 seconds  Total Procedure Duration: 0 hours 24 minutes 28 seconds  Findings:                 The digital rectal exam revealed a firm distal                            rectal polyp. The mass was non-circumferential and                            located predominantly at the posterior bowel wall                            at the distal rectum/proximal anal canal.                           A 10 mm polyp was found in the anus. The polyp was                            semi-pedunculated. This may represent a  hypertrophied anal papillae. Biopsies were taken                            with a cold forceps for histology.                           Internal hemorrhoids were found during                            retroflexion. The hemorrhoids were small.                           The exam was otherwise without abnormality. Complications:            No immediate complications. Estimated Blood Loss:     Estimated blood loss was minimal. Impression:               - One 10 mm polyp versus hypertrophied anal                            papillae at the anus. Biopsied.                           - Small internal hemorrhoids.                           - The examination was otherwise normal. Recommendation:           - Patient has a contact number available for                            emergencies. The signs and symptoms of potential                            delayed  complications were discussed with the                            patient. Return to normal activities tomorrow.                            Written discharge instructions were provided to the                            patient.                           - Resume previous diet.                           - Continue present medications.                           - Await pathology results. Consider surgical                            referral for excision given rectal bleeding.                           -  In setting of constipation consider addition of                            Linzess or Amitiza.                           - Repeat colonoscopy is recommended. The                            colonoscopy date will be determined after pathology                            results from today's exam become available for                            review. Beverley Fiedler, MD 02/08/2020 11:59:41 AM This report has been signed electronically.

## 2020-02-08 NOTE — Progress Notes (Signed)
Called to room to assist during endoscopic procedure.  Patient ID and intended procedure confirmed with present staff. Received instructions for my participation in the procedure from the performing physician.  

## 2020-02-08 NOTE — Patient Instructions (Addendum)
Handouts were given to you on polyps and hemorrhoids. You may resume your current medications today. Await biopsy results.  Usually it takes 2-3 weeks to receive pathology results. Please call if any questions or concerns.  Linzess samples given to you.    YOU HAD AN ENDOSCOPIC PROCEDURE TODAY AT THE [facility]:   Refer to the procedure report that was given to you for any specific questions about what was found during the examination.  If the procedure report does not answer your questions, please call your gastroenterologist to clarify.  If you requested that your care partner not be given the details of your procedure findings, then the procedure report has been included in a sealed envelope for you to review at your convenience later.  YOU SHOULD EXPECT: Some feelings of bloating in the abdomen. Passage of more gas than usual.  Walking can help get rid of the air that was put into your GI tract during the procedure and reduce the bloating. If you had a lower endoscopy (such as a colonoscopy or flexible sigmoidoscopy) you may notice spotting of blood in your stool or on the toilet paper. If you underwent a bowel prep for your procedure, you may not have a normal bowel movement for a few days.  Please Note:  You might notice some irritation and congestion in your nose or some drainage.  This is from the oxygen used during your procedure.  There is no need for concern and it should clear up in a day or so.  SYMPTOMS TO REPORT IMMEDIATELY:   Following lower endoscopy (colonoscopy or flexible sigmoidoscopy):  Excessive amounts of blood in the stool  Significant tenderness or worsening of abdominal pains  Swelling of the abdomen that is new, acute  Fever of 100F or higher    For urgent or emergent issues, a gastroenterologist can be reached at any hour by calling [phone]. Do not use MyChart messaging for urgent concerns.    DIET:  We do recommend a small meal at  first, but then you may proceed to your regular diet.  Drink plenty of fluids but you should avoid alcoholic beverages for 24 hours.  ACTIVITY:  You should plan to take it easy for the rest of today and you should NOT DRIVE or use heavy machinery until tomorrow (because of the sedation medicines used during the test).    FOLLOW UP: Our staff will call the number listed on your records 48-72 hours following your procedure to check on you and address any questions or concerns that you may have regarding the information given to you following your procedure. If we do not reach you, we will leave a message.  We will attempt to reach you two times.  During this call, we will ask if you have developed any symptoms of COVID 19. If you develop any symptoms (ie: fever, flu-like symptoms, shortness of breath, cough etc.) before then, please call [phone].  If you test positive for Covid 19 in the 2 weeks post procedure, please call and report this information to [place].    If any biopsies were taken you will be contacted by phone or by letter within the next 1-3 weeks.  Please call us at [phone] if you have not heard about the biopsies in 3 weeks.    SIGNATURES/CONFIDENTIALITY: You and/or your care partner have signed paperwork which will be entered into your electronic medical record.  These signatures attest to the fact that that the information  above on your After Visit Summary has been reviewed and is understood.  Full responsibility of the confidentiality of this discharge information lies with you and/or your care-partner.

## 2020-02-08 NOTE — Progress Notes (Signed)
Pt's states no medical or surgical changes since previsit or office visit.Pt's states no medical or surgical changes since previsit or office visit.  SF - vitals

## 2020-02-08 NOTE — Progress Notes (Signed)
Report given to PACU, vss 

## 2020-02-10 ENCOUNTER — Telehealth: Payer: Self-pay

## 2020-02-10 NOTE — Telephone Encounter (Signed)
°  Follow up Call-  Call back number 02/08/2020  Post procedure Call Back phone  # [phone]  Permission to leave phone message Yes  Some recent data might be hidden     Patient questions:  Do you have a fever, pain , or abdominal swelling? No. Pain Score  0 *  Have you tolerated food without any problems? Yes.    Have you been able to return to your normal activities? Yes.    Do you have any questions about your discharge instructions: Diet   No. Medications  No. Follow up visit  No.  Do you have questions or concerns about your Care? No.  Actions: * If pain score is 4 or above: No action needed, pain <4.  1. Have you developed a fever since your procedure? no  2.   Have you had an respiratory symptoms (SOB or cough) since your procedure? no  3.   Have you tested positive for COVID 19 since your procedure no  4.   Have you had any family members/close contacts diagnosed with the COVID 19 since your procedure?  no   If yes to any of these questions please route to Laverna Peace, RN and Karlton Lemon, RN

## 2020-02-14 ENCOUNTER — Encounter: Payer: Self-pay | Admitting: Internal Medicine

## 2020-02-14 ENCOUNTER — Other Ambulatory Visit: Payer: Self-pay

## 2020-02-14 MED ORDER — LINACLOTIDE 72 MCG PO CAPS: 72.0000 ug | ORAL_CAPSULE | Freq: Every day | ORAL | 11 refills | Status: DC

## 2020-02-14 MED FILL — AMLODIPINE BESYLATE 5 MG TA: 5 | 30 days supply | Qty: 30 | Fill #0

## 2020-02-14 NOTE — Progress Notes (Signed)
Office Visit Note  Patient: Lindsay Wong             Date of Birth: [birth year]           MRN: [id number]             PCP: Caren Macadam, MD Referring: Caren Macadam, MD Visit Date: 02/25/2020 Occupation: @GUAROCC @  Subjective:  Increased joint pain and fatigue   History of Present Illness: Lindsay Wong is a 39 y.o. female with history of Raynaud's and positive ANA.  She was placed on amlodipine at the last visit.  She has been doing better on amlodipine with decreased Raynauds phenomenon.  She has not experienced any digital ulcers.  She states she had episodes of GI bleed and had a colonoscopy which was negative except for rectal polyp.  She has been placed on Linzess.  She still has episodic diarrhea.  She had some recent labs by her PCP which showed positive ANA and mildly elevated sedimentation rate.  She complains of increased fatigue, increased joint pain, oral ulcers and she felt some lymph nodes in her neck.  Activities of Daily Living:  Patient reports morning stiffness for more than 30 minutes.   Patient Denies nocturnal pain.  Difficulty dressing/grooming: Denies Difficulty climbing stairs: Reports Difficulty getting out of chair: Denies Difficulty using hands for taps, buttons, cutlery, and/or writing: Denies  Review of Systems  Constitutional: Positive for fatigue and weight gain. Negative for night sweats and weight loss.  HENT: Positive for mouth dryness and nose dryness. Negative for mouth sores, trouble swallowing and trouble swallowing.   Eyes: Negative for pain, redness, itching, visual disturbance and dryness.  Respiratory: Negative for cough, shortness of breath and difficulty breathing.   Cardiovascular: Negative for chest pain, palpitations, hypertension, irregular heartbeat and swelling in legs/feet.  Gastrointestinal: Negative for abdominal pain, blood in stool, constipation and diarrhea.  Endocrine: Negative for increased urination.    Genitourinary: Positive for nocturia. Negative for difficulty urinating, painful urination and vaginal dryness.  Musculoskeletal: Positive for arthralgias, joint pain, myalgias, morning stiffness, muscle tenderness and myalgias. Negative for joint swelling and muscle weakness.  Skin: Positive for color change, rash and sensitivity to sunlight. Negative for hair loss, skin tightness and ulcers.  Allergic/Immunologic: Negative for susceptible to infections.  Neurological: Positive for numbness. Negative for dizziness, headaches, memory loss, night sweats and weakness.  Hematological: Positive for bruising/bleeding tendency and swollen glands.  Psychiatric/Behavioral: Negative for depressed mood, confusion and sleep disturbance. The patient is not nervous/anxious.     PMFS History:  Patient Active Problem List   Diagnosis Date Noted  . Adverse effect of other viral vaccines, subsequent encounter 01/27/2020  . Spinal stenosis of cervical region 12/28/2019  . Fatigue 08/20/2019  . Anxiety and depression 05/19/2017  . Left shoulder pain 07/19/2014  . RAYNAUD'S DISEASE 01/31/2007    Past Medical History:  Diagnosis Date  . Allergy   . Anxiety and depression    per patient diagnosed by psychiatrist  . Appendicitis 07/20/2012  . Blood in stool   . Chicken pox   . Depression    postpartum with one child  . Fatty liver   . GERD 01/31/2007   Qualifier: Diagnosis of  By: Marca Ancona RMA, [name]    . Hyperlipidemia    postpartum  . PONV (postoperative nausea and vomiting)   . Positive ANA (antinuclear antibody)   . RAYNAUD'S DISEASE 01/31/2007   Qualifier: Diagnosis of  By: Marca Ancona RMA, [name]    .  Urticaria     Family History  Problem Relation Age of Onset  . Early menopause Mother   . Other Father        no contact since infant  . Raynaud syndrome Sister   . Cancer Maternal Grandmother        melanoma  . Stroke Maternal Grandmother 69  . Arthritis Maternal Grandmother   . Hyperlipidemia  Maternal Grandmother   . Hypertension Maternal Grandmother   . Breast cancer Maternal Grandmother 72  . Cancer Maternal Grandfather        bladder  . Bladder Cancer Maternal Grandfather   . Lung cancer Other        all but one of grandmother's siblings   . Lupus Maternal Uncle   . Leukemia Maternal Uncle   . Multiple sclerosis Cousin   . Autism Son   . Healthy Son   . Healthy Son   . Healthy Son   . Immunodeficiency Maternal Aunt   . Colon cancer Neg Hx   . Esophageal cancer Neg Hx   . Stomach cancer Neg Hx   . Rectal cancer Neg Hx    Past Surgical History:  Procedure Laterality Date  . CESAREAN SECTION     x3  . COLONOSCOPY  01/2020  . LAPAROSCOPIC APPENDECTOMY Right 07/20/2012   Procedure: APPENDECTOMY LAPAROSCOPIC;  Surgeon: Earnstine Regal, MD;  Location: [facility];  Service: General;  Laterality: Right;  . 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 Influenza Split 01/04/2011  . Influenza Whole 04/07/2006, 02/01/2008  . Influenza,inj,Quad PF,6+ Mos 01/14/2014, 02/08/2015, 02/20/2016, 02/12/2017, 02/13/2018, 03/01/2019  . Influenza-Unspecified 01/11/2013, 02/11/2016  . Janssen (J&J) SARS-COV-2 Vaccination 01/28/2020  . Tdap 03/01/2019     Objective: Vital Signs: BP (!) 144/95 (BP Location: Left Arm, Patient Position: Sitting, Cuff Size: Normal)   Pulse 92   Resp 13   Ht 5' 3"  (1.6 m)   Wt 159 lb (72.1 kg)   BMI 28.17 kg/m    Physical Exam Vitals and nursing note reviewed.  Constitutional:      Appearance: She is well-developed.  HENT:     Head: Normocephalic and atraumatic.  Eyes:      Conjunctiva/sclera: Conjunctivae normal.  Cardiovascular:     Rate and Rhythm: Normal rate and regular rhythm.     Heart sounds: Normal heart sounds.  Pulmonary:     Effort: Pulmonary effort is normal.     Breath sounds: Normal breath sounds.  Abdominal:     General: Bowel sounds are normal.     Palpations: Abdomen is soft.  Musculoskeletal:     Cervical back: Normal range of motion.  Lymphadenopathy:     Cervical: No cervical adenopathy.  Skin:    General: Skin is warm and dry.     Capillary Refill: Capillary refill takes less than 2 seconds.  Neurological:     Mental Status: She is alert and oriented to person, place, and time.  Psychiatric:  Behavior: Behavior normal.      Musculoskeletal Exam: C-spine thoracic and lumbar spine were in good range of motion.  She has some discomfort in the lower lumbar region and SI joint region.  Shoulder joints, elbow joints, wrist joints, MCPs PIPs and DIPs with good range of motion with no synovitis.  Hip joints, knee joints, ankles, MTPs and PIPs with good range of motion with no synovitis.  CDAI Exam: CDAI Score: -- Patient Global: --; Provider Global: -- Swollen: --; Tender: -- Joint Exam 02/25/2020   No joint exam has been documented for this visit   There is currently no information documented on the homunculus. Go to the Rheumatology activity and complete the homunculus joint exam.  Investigation: No additional findings.  Imaging: No results found.  Recent Labs: Lab Results  Component Value Date   WBC 6.7 11/01/2019   HGB 13.3 11/01/2019   PLT 306 11/01/2019   NA 138 11/01/2019   K 4.0 11/01/2019   CL 102 11/01/2019   CO2 27 11/01/2019   GLUCOSE 91 11/01/2019   BUN 6 (L) 11/01/2019   CREATININE 0.89 11/01/2019   BILITOT 0.8 11/01/2019   ALKPHOS 89 07/17/2018   AST 15 11/01/2019   ALT 15 11/01/2019   PROT 7.5 11/01/2019   ALBUMIN 4.5 07/17/2018   CALCIUM 10.2 11/01/2019   GFRAA 93 11/01/2019   December 29, 2019 ANA 1: 320 speckled, ESR 25  C-reactive protein 8.3 on February 17, 2020  Speciality Comments: No specialty comments available.  Procedures:  No procedures performed Allergies: Clarithromycin, Peg-2000 (covid-19 mrna vaccine component), and Latex   Assessment / Plan:     Visit Diagnoses: Autoimmune disease ([place]) - ANA 1: 640 speckled pattern, ENA negative, C3-C4 normal, anticardiolipin, beta-2, LA-,  History of oral ulcers, arthralgias, photosensitivity, sicca symptoms, Raynaud's and hives.  She has been having increased fatigue and increased arthralgias.  We had detailed discussion regarding Plaquenil again.  Indications side effects contraindications were discussed.  She wants to proceed with Plaquenil.  Handout was given and consent was taken.  She will be starting on Plaquenil 200 mg p.o. twice daily Monday to Friday.  We will get labs in a month and every 3 months to monitor for drug toxicity.  She will need a baseline eye examination and then yearly eye examination.  Patient was counseled on the purpose, proper use, and adverse effects of hydroxychloroquine including nausea/diarrhea, skin rash, headaches, and sun sensitivity.  Discussed importance of annual eye exams while on hydroxychloroquine to monitor to ocular toxicity and discussed importance of frequent laboratory monitoring.  Provided patient with eye exam form for baseline ophthalmologic exam.  Provided patient with educational materials on hydroxychloroquine and answered all questions.  Patient consented to hydroxychloroquine.  Will upload consent in the media tab.     Raynaud's syndrome without gangrene - History of digital ulcer.  She is doing much better on amlodipine.  Hives-she continues to have intermittent hives.  Pain in both hands - X-rays were unremarkable.  Pain in both feet - X-rays were unremarkable.  Primary osteoarthritis of both knees - Bilateral moderate osteoarthritis and moderate chondromalacia  patella  Pre-eclampsia in third trimester - History of preterm labor and preeclampsia with all 3 pregnancies.  History of esophageal reflux  History of migraine - Followed by neurologist.  Anxiety and depression  Family history of multiple sclerosis - Maternal uncle and first cousin  Educated about COVID-70 virus infection-she is fully vaccinated against COVID-19.  Have advised a  booster once available to her.  Use of mask, social distancing and hand hygiene was discussed.  I also discussed use of monoclonal antibody infusion in case she develops COVID-19 infection.  Orders: No orders of the defined types were placed in this encounter.  Meds ordered this encounter  Medications  . hydroxychloroquine (PLAQUENIL) 200 MG tablet    Sig: Take 287m by mouth twice daily, Monday through Friday only.    Dispense:  40 tablet    Refill:  0     Follow-Up Instructions: Return in about 6 weeks (around 04/07/2020) for Autoimmune disease.   SBo Merino MD  Note - This record has been created using DEditor, commissioning  Chart creation errors have been sought, but may not always  have been located. Such creation errors do not reflect on  the standard of medical care.

## 2020-02-15 MED FILL — LEVOCETIRIZINE 5 MG TABLET: 5 | 30 days supply | Qty: 60 | Fill #2

## 2020-02-21 MED FILL — LINZESS 72 MCG CAPSULE: 72 | 30 days supply | Qty: 30 | Fill #0

## 2020-02-25 ENCOUNTER — Other Ambulatory Visit: Payer: Self-pay

## 2020-02-25 ENCOUNTER — Encounter: Payer: Self-pay | Admitting: Family Medicine

## 2020-02-25 ENCOUNTER — Ambulatory Visit: Payer: No Typology Code available for payment source | Admitting: Rheumatology

## 2020-02-25 ENCOUNTER — Other Ambulatory Visit: Payer: Self-pay | Admitting: Rheumatology

## 2020-02-25 ENCOUNTER — Encounter: Payer: Self-pay | Admitting: Rheumatology

## 2020-02-25 ENCOUNTER — Telehealth: Payer: Self-pay | Admitting: Rheumatology

## 2020-02-25 VITALS — BP 144/95 | HR 92 | Resp 13 | Ht 63.0 in | Wt 159.0 lb

## 2020-02-25 DIAGNOSIS — M79672 Pain in left foot: Secondary | ICD-10-CM

## 2020-02-25 DIAGNOSIS — M359 Systemic involvement of connective tissue, unspecified: Secondary | ICD-10-CM

## 2020-02-25 DIAGNOSIS — M79671 Pain in right foot: Secondary | ICD-10-CM

## 2020-02-25 DIAGNOSIS — L509 Urticaria, unspecified: Secondary | ICD-10-CM

## 2020-02-25 DIAGNOSIS — F32A Depression, unspecified: Secondary | ICD-10-CM

## 2020-02-25 DIAGNOSIS — M79641 Pain in right hand: Secondary | ICD-10-CM | POA: Diagnosis not present

## 2020-02-25 DIAGNOSIS — Z8669 Personal history of other diseases of the nervous system and sense organs: Secondary | ICD-10-CM

## 2020-02-25 DIAGNOSIS — I7300 Raynaud's syndrome without gangrene: Secondary | ICD-10-CM | POA: Diagnosis not present

## 2020-02-25 DIAGNOSIS — M170 Bilateral primary osteoarthritis of knee: Secondary | ICD-10-CM

## 2020-02-25 DIAGNOSIS — Z79899 Other long term (current) drug therapy: Secondary | ICD-10-CM

## 2020-02-25 DIAGNOSIS — Z8719 Personal history of other diseases of the digestive system: Secondary | ICD-10-CM

## 2020-02-25 DIAGNOSIS — M79642 Pain in left hand: Secondary | ICD-10-CM

## 2020-02-25 DIAGNOSIS — F419 Anxiety disorder, unspecified: Secondary | ICD-10-CM

## 2020-02-25 DIAGNOSIS — O1493 Unspecified pre-eclampsia, third trimester: Secondary | ICD-10-CM

## 2020-02-25 DIAGNOSIS — Z820 Family history of epilepsy and other diseases of the nervous system: Secondary | ICD-10-CM

## 2020-02-25 MED ORDER — HYDROXYCHLOROQUINE SULFATE 200 MG PO TABS: ORAL_TABLET | ORAL | 0 refills | Status: DC

## 2020-02-25 MED FILL — HYDROXYCHLOROQUINE 200 MG T: 200 | 28 days supply | Qty: 40 | Fill #0

## 2020-02-25 MED FILL — DULoxetine HCL 30 MG CPEP: 30 | 30 days supply | Qty: 60 | Fill #1

## 2020-02-25 MED FILL — LEVOCETIRIZINE 5 MG TABLET: 5 | 30 days supply | Qty: 60 | Fill #0

## 2020-02-25 NOTE — Telephone Encounter (Signed)
Opened in error

## 2020-02-25 NOTE — Patient Instructions (Signed)
COVID-19 vaccine recommendations:   COVID-19 vaccine is recommended for everyone (unless you are allergic to a vaccine component), even if you are on a medication that suppresses your immune system. Do not take Tylenol or any anti-inflammatory medications (NSAIDs) 24 hours prior to the COVID-19 vaccination.   There is no direct evidence about the efficacy of the COVID-19 vaccine in individuals who are on medications that suppress the immune system.   Even if you are fully vaccinated, and you are on any medications that suppress your immune system, please continue to wear a mask, maintain at least six feet social distance and practice hand hygiene.   If you develop a COVID-19 infection, please contact your PCP or our office to determine if you need antibody infusion.  The booster vaccine is now available for immunocompromised patients. It is advised that if you had Pfizer vaccine you should get [employer] booster.  If you had a Moderna vaccine then you should get a Moderna booster. Johnson and Laural Benes does not have a booster vaccine at this time.  Please see the following web sites for updated information.   https://[email or web address].org/Portals/0/Files/[email or web address]-Vaccination-Patient-Resources.[email or web address]     Hydroxychloroquine tablets What is this medicine? HYDROXYCHLOROQUINE (hye drox ee KLOR oh kwin) is used to treat rheumatoid arthritis and systemic lupus erythematosus. It is also used to treat malaria. This medicine may be used for other purposes; ask your health care provider or pharmacist if you have questions. COMMON BRAND NAME(S): Plaquenil, Quineprox What should I tell my health care provider before I take this medicine? They need to know if you have any of these conditions:  diabetes  eye disease, vision problems  G6PD deficiency  heart disease  history of irregular heartbeat  if you often drink alcohol  kidney disease  liver disease  porphyria  psoriasis  an unusual or  allergic reaction to chloroquine, hydroxychloroquine, other medicines, foods, dyes, or preservatives  pregnant or trying to get pregnant  breast-feeding How should I use this medicine? Take this medicine by mouth with a glass of water. Follow the directions on the prescription label. Do not cut, crush or chew this medicine. Swallow the tablets whole. Take this medicine with food. Avoid taking antacids within 4 hours of taking this medicine. It is best to separate these medicines by at least 4 hours. Take your medicine at regular intervals. Do not take it more often than directed. Take all of your medicine as directed even if you think you are better. Do not skip doses or stop your medicine early. Talk to your pediatrician regarding the use of this medicine in children. While this drug may be prescribed for selected conditions, precautions do apply. Overdosage: If you think you have taken too much of this medicine contact a poison control center or emergency room at once. NOTE: This medicine is only for you. Do not share this medicine with others. What if I miss a dose? If you miss a dose, take it as soon as you can. If it is almost time for your next dose, take only that dose. Do not take double or extra doses. What may interact with this medicine? Do not take this medicine with any of the following medications:  cisapride  dronedarone  pimozide  thioridazine This medicine may also interact with the following medications:  ampicillin  antacids  cimetidine  cyclosporine  digoxin  kaolin  medicines for diabetes, like insulin, glipizide, glyburide  medicines for seizures like carbamazepine, phenobarbital, phenytoin  mefloquine  methotrexate  other medicines  that prolong the QT interval (cause an abnormal heart rhythm)  praziquantel This list may not describe all possible interactions. Give your health care provider a list of all the medicines, herbs, non-prescription  drugs, or dietary supplements you use. Also tell them if you smoke, drink alcohol, or use illegal drugs. Some items may interact with your medicine. What should I watch for while using this medicine? Visit your health care professional for regular checks on your progress. Tell your health care professional if your symptoms do not start to get better or if they get worse. You may need blood work done while you are taking this medicine. If you take other medicines that can affect heart rhythm, you may need more testing. Talk to your health care professional if you have questions. Your vision may be tested before and during use of this medicine. Tell your health care professional right away if you have any change in your eyesight. What side effects may I notice from receiving this medicine? Side effects that you should report to your doctor or health care professional as soon as possible:  allergic reactions like skin rash, itching or hives, swelling of the face, lips, or tongue  changes in vision  decreased hearing or ringing of the ears  muscle weakness  redness, blistering, peeling or loosening of the skin, including inside the mouth  sensitivity to light  signs and symptoms of a dangerous change in heartbeat or heart rhythm like chest pain; dizziness; fast or irregular heartbeat; palpitations; feeling faint or lightheaded, falls; breathing problems  signs and symptoms of liver injury like dark yellow or brown urine; general ill feeling or flu-like symptoms; light-colored stools; loss of appetite; nausea; right upper belly pain; unusually weak or tired; yellowing of the eyes or skin  signs and symptoms of low blood sugar such as feeling anxious; confusion; dizziness; increased hunger; unusually weak or tired; sweating; shakiness; cold; irritable; headache; blurred vision; fast heartbeat; loss of consciousness  suicidal thoughts  uncontrollable head, mouth, neck, arm, or leg  movements Side effects that usually do not require medical attention (report to your doctor or health care professional if they continue or are bothersome):  diarrhea  dizziness  hair loss  headache  irritable  loss of appetite  nausea, vomiting  stomach pain This list may not describe all possible side effects. Call your doctor for medical advice about side effects. You may report side effects to FDA at 1-800-FDA-1088. Where should I keep my medicine? Keep out of the reach of children. Store at room temperature between 15 and 30 degrees C (59 and 86 degrees F). Protect from moisture and light. Throw away any unused medicine after the expiration date. NOTE: This sheet is a summary. It may not cover all possible information. If you have questions about this medicine, talk to your doctor, pharmacist, or health care provider.  2020 Elsevier/Gold Standard (2018-09-07 12:56:32)  Standing Labs We placed an order today for your standing lab work.   Please have your standing labs drawn in in 1 month and every 3 months  If possible, please have your labs drawn 2 weeks prior to your appointment so that the provider can discuss your results at your appointment.  We have open lab daily Monday through Thursday from 8:30-12:30 PM and 1:30-4:30 PM and Friday from 8:30-12:30 PM and 1:30-4:00 PM at the office of Dr. Pollyann Savoy, [facility].   Please be advised, patients with office appointments requiring lab work will take precedents  over walk-in lab work.  If possible, please come for your lab work on Monday and Friday afternoons, as you may experience shorter wait times. The office is located at [address], [address], [place], [place] [address] No appointment is necessary.   Labs are drawn by [facility]. Please bring your co-pay at the time of your lab draw.  You may receive a bill from [facility] for your lab work.  If you wish to have your labs drawn at another location,  please call the office 24 hours in advance to send orders.  If you have any questions regarding directions or hours of operation,  please call [phone].   As a reminder, please drink plenty of water prior to coming for your lab work. Thanks!

## 2020-03-03 ENCOUNTER — Other Ambulatory Visit: Payer: Self-pay

## 2020-03-03 MED ORDER — LUBIPROSTONE 8 MCG PO CAPS: 8.0000 ug | ORAL_CAPSULE | Freq: Two times a day (BID) | ORAL | 3 refills | Status: DC

## 2020-03-03 MED FILL — LUBIPROSTONE 8 MCG CAPS: 8 | 30 days supply | Qty: 60 | Fill #0

## 2020-03-03 NOTE — Telephone Encounter (Signed)
Please let Kerrie know that the hypertrophied anal papilla should not interfere with the urge to defecate so I do not think it needs to be removed for this reason.  Reasons for removal would be pain, bleeding or interfering with wiping/cleaning.  It sounds like she is having loose stools but only every 3 to 4 days and is still having bloating and borborygmi. To me this sounds like Linzess is not being effective, I also am concerned that if I increase Linzess dose stools would be even more explosive/loose  I would recommend we stop the Linzess and try Amitiza 8 mcg twice daily with food  She should keep me updated as to if this medication is helping after trying it for about 2 weeks

## 2020-03-10 MED FILL — ESOMEPRAZOLE MAG DR 40 MG C: 40 | 30 days supply | Qty: 30 | Fill #2

## 2020-03-13 ENCOUNTER — Other Ambulatory Visit: Payer: Self-pay | Admitting: Rheumatology

## 2020-03-13 ENCOUNTER — Other Ambulatory Visit: Payer: Self-pay | Admitting: Physician Assistant

## 2020-03-13 MED FILL — AMLODIPINE BESYLATE 5 MG TA: 5 | 90 days supply | Qty: 90 | Fill #0

## 2020-03-13 NOTE — Telephone Encounter (Signed)
Last Visit: 02/25/2020 Next Visit: 04/11/2020  Current Dose per office note on 02/25/2020: dose not specified.  DX: Raynaud's syndrome without gangrene  Okay to refill amlodipine?

## 2020-03-22 ENCOUNTER — Other Ambulatory Visit: Payer: Self-pay | Admitting: Rheumatology

## 2020-03-22 ENCOUNTER — Other Ambulatory Visit: Payer: Self-pay | Admitting: Physician Assistant

## 2020-03-22 MED FILL — HYDROXYCHLOROQUINE 200 MG T: 200 | 28 days supply | Qty: 40 | Fill #0

## 2020-03-22 MED FILL — DULoxetine HCL 30 MG CPEP: 30 | 30 days supply | Qty: 60 | Fill #0

## 2020-03-22 MED FILL — LEVOCETIRIZINE 5 MG TABLET: 5 | 30 days supply | Qty: 60 | Fill #1

## 2020-03-22 NOTE — Telephone Encounter (Signed)
Ok to refill 30 day supply only until we have updated eye exam.

## 2020-03-22 NOTE — Telephone Encounter (Signed)
Last Visit: 02/25/2020 Next Visit: 04/11/2020 Labs: 11/01/2019 BUN 6 Eye exam: no baseline on file. Patient states she has her eye exam scheduled for 03/24/19 and will have results faxed after.   Current Dose per office note 02/25/2020: Plaquenil 200 mg p.o. twice daily Monday to Friday DX:  Autoimmune disease   Okay to refill Plaquenil?

## 2020-03-27 ENCOUNTER — Other Ambulatory Visit: Payer: Self-pay | Admitting: *Deleted

## 2020-03-27 DIAGNOSIS — M170 Bilateral primary osteoarthritis of knee: Secondary | ICD-10-CM

## 2020-03-27 DIAGNOSIS — R5383 Other fatigue: Secondary | ICD-10-CM

## 2020-03-27 DIAGNOSIS — M359 Systemic involvement of connective tissue, unspecified: Secondary | ICD-10-CM

## 2020-03-27 DIAGNOSIS — I7300 Raynaud's syndrome without gangrene: Secondary | ICD-10-CM

## 2020-03-27 DIAGNOSIS — M79641 Pain in right hand: Secondary | ICD-10-CM

## 2020-03-27 DIAGNOSIS — M79642 Pain in left hand: Secondary | ICD-10-CM

## 2020-03-28 LAB — CBC WITH DIFFERENTIAL/PLATELET
Absolute Monocytes: 648 cells/uL (ref 200–950)
Basophils Absolute: 49 cells/uL (ref 0–200)
Basophils Relative: 0.6 %
Eosinophils Absolute: 197 cells/uL (ref 15–500)
Eosinophils Relative: 2.4 %
HCT: 36.4 % (ref 35.0–45.0)
Hemoglobin: 12.1 g/dL (ref 11.7–15.5)
Lymphs Abs: 2501 cells/uL (ref 850–3900)
MCH: 27.6 pg (ref 27.0–33.0)
MCHC: 33.2 g/dL (ref 32.0–36.0)
MCV: 83.1 fL (ref 80.0–100.0)
MPV: 11.5 fL (ref 7.5–12.5)
Monocytes Relative: 7.9 %
Neutro Abs: 4805 cells/uL (ref 1500–7800)
Neutrophils Relative %: 58.6 %
Platelets: 345 10*3/uL (ref 140–400)
RBC: 4.38 10*6/uL (ref 3.80–5.10)
RDW: 12.8 % (ref 11.0–15.0)
Total Lymphocyte: 30.5 %
WBC: 8.2 10*3/uL (ref 3.8–10.8)

## 2020-03-28 LAB — COMPLETE METABOLIC PANEL WITH GFR
AG Ratio: 1.6 (calc) (ref 1.0–2.5)
ALT: 13 U/L (ref 6–29)
AST: 12 U/L (ref 10–30)
Albumin: 4.4 g/dL (ref 3.6–5.1)
Alkaline phosphatase (APISO): 95 U/L (ref 31–125)
BUN: 8 mg/dL (ref 7–25)
CO2: 27 mmol/L (ref 20–32)
Calcium: 9.6 mg/dL (ref 8.6–10.2)
Chloride: 102 mmol/L (ref 98–110)
Creat: 0.82 mg/dL (ref 0.50–1.10)
GFR, Est African American: 103 mL/min/{1.73_m2} (ref >=60)
GFR, Est Non African American: 89 mL/min/{1.73_m2} (ref >=60)
Globulin: 2.8 g/dL (calc) (ref 1.9–3.7)
Glucose, Bld: 58 mg/dL — ABNORMAL LOW (ref 65–139)
Potassium: 3.9 mmol/L (ref 3.5–5.3)
Sodium: 138 mmol/L (ref 135–146)
Total Bilirubin: 0.4 mg/dL (ref 0.2–1.2)
Total Protein: 7.2 g/dL (ref 6.1–8.1)

## 2020-03-28 NOTE — Progress Notes (Signed)
Office Visit Note  Patient: Lindsay Wong             Date of Birth: Aug 17, 1980           MRN: [id number]             PCP: Wynn Banker, MD Referring: Wynn Banker, MD Visit Date: 04/11/2020 Occupation: @GUAROCC @  Subjective:  Medication monitoring.   History of Present Illness: Lindsay Wong is a 39 y.o. female with history of autoimmune disease.  She has been tolerating Plaquenil well.  She has noticed improvement in her fatigue.  She states the joint pain has improved.  She is returned back to kickboxing.  She states she feels some deconditioning and is gradually getting better.  She still gets some shortness of breath on exertion which gets better after a while.  She denies any palpitations.  The Raynauds is a still active but she has not noticed any major discoloration in her fingers all digital ulcers.  She denies any history of oral or nasal ulcers.  She has mild sicca symptoms which are tolerable.  Activities of Daily Living:  Patient reports morning stiffness for 15 minutes.   Patient Denies nocturnal pain.  Difficulty dressing/grooming: Denies Difficulty climbing stairs: Denies Difficulty getting out of chair: Denies Difficulty using hands for taps, buttons, cutlery, and/or writing: Denies  Review of Systems  Constitutional: Positive for fatigue.  HENT: Positive for mouth dryness and nose dryness. Negative for mouth sores.   Eyes: Positive for dryness. Negative for pain and itching.  Respiratory: Positive for shortness of breath. Negative for difficulty breathing.   Cardiovascular: Negative for chest pain and palpitations.  Gastrointestinal: Negative for blood in stool, constipation and diarrhea.  Endocrine: Negative for increased urination.  Genitourinary: Negative for difficulty urinating and painful urination.  Musculoskeletal: Positive for arthralgias, joint pain, myalgias, morning stiffness and myalgias. Negative for joint swelling and muscle  tenderness.  Skin: Positive for color change. Negative for rash and redness.  Allergic/Immunologic: Negative for susceptible to infections.  Neurological: Positive for numbness. Negative for dizziness, headaches, memory loss and weakness.  Hematological: Positive for bruising/bleeding tendency.  Psychiatric/Behavioral: Negative for confusion.    PMFS History:  Patient Active Problem List   Diagnosis Date Noted  . Adverse effect of other viral vaccines, subsequent encounter 01/27/2020  . Spinal stenosis of cervical region 12/28/2019  . Fatigue 08/20/2019  . Anxiety and depression 05/19/2017  . Left shoulder pain 07/19/2014  . RAYNAUD'S DISEASE 01/31/2007    Past Medical History:  Diagnosis Date  . Allergy   . Anxiety and depression    per patient diagnosed by psychiatrist  . Appendicitis 07/20/2012  . Blood in stool   . Chicken pox   . Depression    postpartum with one child  . Fatty liver   . GERD 01/31/2007   Qualifier: Diagnosis of  By: 02/02/2007 RMA, [name]    . Hyperlipidemia    postpartum  . PONV (postoperative nausea and vomiting)   . Positive ANA (antinuclear antibody)   . RAYNAUD'S DISEASE 01/31/2007   Qualifier: Diagnosis of  By: 02/02/2007 RMA, [name]    . Urticaria     Family History  Problem Relation Age of Onset  . Early menopause Mother   . Other Father        no contact since infant  . Raynaud syndrome Sister   . Cancer Maternal Grandmother        melanoma  . Stroke Maternal Grandmother  69  . Arthritis Maternal Grandmother   . Hyperlipidemia Maternal Grandmother   . Hypertension Maternal Grandmother   . Breast cancer Maternal Grandmother 72  . Cancer Maternal Grandfather        bladder  . Bladder Cancer Maternal Grandfather   . Lung cancer Other        all but one of grandmother's siblings   . Lupus Maternal Uncle   . Leukemia Maternal Uncle   . Multiple sclerosis Cousin   . Autism Son   . Healthy Son   . Healthy Son   . Healthy Son   . Immunodeficiency  Maternal Aunt   . Colon cancer Neg Hx   . Esophageal cancer Neg Hx   . Stomach cancer Neg Hx   . Rectal cancer Neg Hx    Past Surgical History:  Procedure Laterality Date  . CESAREAN SECTION     x3  . COLONOSCOPY  01/2020  . LAPAROSCOPIC APPENDECTOMY Right 07/20/2012   Procedure: APPENDECTOMY LAPAROSCOPIC;  Surgeon: Velora Heckler, MD;  Location: [facility];  Service: General;  Laterality: Right;  . 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 Influenza Split 01/04/2011  . Influenza Whole 04/07/2006, 02/01/2008  . Influenza,inj,Quad PF,6+ Mos 01/14/2014, 02/08/2015, 02/20/2016, 02/12/2017, 02/13/2018, 03/01/2019  . Influenza-Unspecified 01/11/2013, 02/11/2016  . Janssen (J&J) SARS-COV-2 Vaccination 01/28/2020  . Tdap 03/01/2019     Objective: Vital Signs: BP 132/82 (BP Location: Left Arm, Patient Position: Sitting, Cuff Size: Normal)   Pulse 98   Resp 15   Ht 5\' 3"  (1.6 m)   Wt 160 lb 9.6 oz (72.8 kg)   BMI 28.45 kg/m    Physical Exam Vitals and nursing note reviewed.  Constitutional:      Appearance: She is well-developed.  HENT:     Head: Normocephalic and atraumatic.  Eyes:     Conjunctiva/sclera: Conjunctivae normal.  Cardiovascular:     Rate and Rhythm: Normal rate and regular rhythm.     Heart sounds: Normal heart sounds.  Pulmonary:     Effort: Pulmonary effort is normal.     Breath sounds: Normal breath sounds.  Abdominal:     General: Bowel sounds are normal.     Palpations: Abdomen is soft.  Musculoskeletal:     Cervical back: Normal range of motion.  Lymphadenopathy:     Cervical: No cervical  adenopathy.  Skin:    General: Skin is warm and dry.     Capillary Refill: Capillary refill takes 2 to 3 seconds.     Comments: No sclerodactyly or nailbed capillary changes were noted.  Neurological:     Mental Status: She is alert and oriented to person, place, and time.  Psychiatric:        Behavior: Behavior normal.      Musculoskeletal Exam: C-spine, thoracic and lumbar spine with good range of motion.  Shoulder joints, elbow joints, wrist joints, MCPs PIPs and DIPs with good range of motion with no synovitis.  Hip joints, knee joints, ankles, MTPs and PIPs with good range of motion with no synovitis.  CDAI Exam: CDAI Score: -- Patient Global: --; Provider Global: -- Swollen: --; Tender: -- Joint Exam 04/11/2020   No joint exam has been documented for this visit   There is currently no information documented on the homunculus. Go to the Rheumatology activity and complete the homunculus joint exam.  Investigation: No additional findings.  Imaging: No results found.  Recent Labs: Lab Results  Component Value Date   WBC 8.2 03/27/2020   HGB 12.1 03/27/2020   PLT 345 03/27/2020   NA 138 03/27/2020   K 3.9 03/27/2020   CL 102 03/27/2020   CO2 27 03/27/2020   GLUCOSE 58 (L) 03/27/2020   BUN 8 03/27/2020   CREATININE 0.82 03/27/2020   BILITOT 0.4 03/27/2020   ALKPHOS 89 07/17/2018   AST 12 03/27/2020   ALT 13 03/27/2020   PROT 7.2 03/27/2020   ALBUMIN 4.5 07/17/2018   CALCIUM 9.6 03/27/2020   GFRAA 103 03/27/2020    Speciality Comments: Eye exam Dr. Georges Mouse- [facility] 11/21  Procedures:  No procedures performed Allergies: Clarithromycin, Peg-2000 (covid-19 mrna vaccine component), and Latex   Assessment / Plan:     Visit Diagnoses: Autoimmune disease (HCC) - ANA 1: 640 speckled pattern, ENA negative, C3-C4 normal, anticardiolipin, beta-2, LA-,  History of fatigue,oral ulcers, arthralgias, photosensitivity, sicca symptoms, Raynaud's  phenomenon.  She is clinically doing much better since she has been on Plaquenil.  She has noticed improvement in fatigue.  She has not had any issues with oral or nasal ulcers.  She denies joint pain.  She is back to exercising.  She still has mild sicca symptoms and Raynaud's phenomenon which is manageable with over-the-counter medications.  High risk medication use - PLQ 200 mg BID M-F.  Astra has been tolerating Plaquenil well.  She states she had an eye examination with Dr. Georges [facility] at [facility] in November which was normal.  I do not have results available.  Raynaud's syndrome without gangrene-keeping core temperature warm was discussed.  Primary osteoarthritis of both knees - Moderate OA.  She is currently not having much discomfort in her knee joints.  Other fatigue-improvement noted by patient.  Shortness of breath-she complains of some shortness of breath on exertion.  She relates to deconditioning.  I have advised her to discuss this further with her PCP.  She may need further work-up.  Pain in both hands - XRs unremarkable  Pain in both feet - XRs unremarkable  Hives-she denies any recent hives.  Other medical problems are listed as follows:  Pre-eclampsia in third trimester  History of esophageal reflux  History of migraine  Anxiety and depression  Family history of multiple sclerosis  Orders: No orders of the defined types were placed in this encounter.  No orders of the defined types were placed in this encounter.    Follow-Up Instructions: Return in about 3 months (around 07/10/2020) for Autoimmune disease.   Pollyann Savoy, MD  Note - This record has been created using Animal nutritionist.  Chart creation errors have been sought, but may not always  have been located. Such creation errors do not reflect on  the standard of medical care.

## 2020-03-31 ENCOUNTER — Telehealth: Payer: Self-pay | Admitting: *Deleted

## 2020-03-31 NOTE — Telephone Encounter (Signed)
-----   Message from Wynn Banker, MD sent at 03/28/2020  6:02 PM EST ----- I think it would be good to set her up for visit to discuss. We could also send in glucometer for her to check at home with dx of hypoglycemia. May need additional evaluation through labs or to see endocrinology if this is happening frequently. ----- Message ----- From: Audrie Lia, RT Sent: 03/28/2020   9:17 AM EST To: Wynn Banker, MD  Please review mutual patient's lab results. Patient states she has issues with her glucose feeling low and shaky. Patient stated she had eaten before labs were drawn. Thank you.

## 2020-03-31 NOTE — Telephone Encounter (Signed)
Spoke with the pt and informed her the appt will be scheduled on 12/6 to arrive at 10:15am.  Message sent to [facility] to add the time slot.

## 2020-04-03 MED FILL — LUBIPROSTONE 8 MCG CAPS: 8 | 30 days supply | Qty: 60 | Fill #1

## 2020-04-11 ENCOUNTER — Other Ambulatory Visit: Payer: Self-pay

## 2020-04-11 ENCOUNTER — Encounter: Payer: Self-pay | Admitting: Rheumatology

## 2020-04-11 ENCOUNTER — Ambulatory Visit: Payer: No Typology Code available for payment source | Admitting: Rheumatology

## 2020-04-11 VITALS — BP 132/82 | HR 98 | Resp 15 | Ht 63.0 in | Wt 160.6 lb

## 2020-04-11 DIAGNOSIS — M359 Systemic involvement of connective tissue, unspecified: Secondary | ICD-10-CM | POA: Diagnosis not present

## 2020-04-11 DIAGNOSIS — M170 Bilateral primary osteoarthritis of knee: Secondary | ICD-10-CM | POA: Diagnosis not present

## 2020-04-11 DIAGNOSIS — I7300 Raynaud's syndrome without gangrene: Secondary | ICD-10-CM | POA: Diagnosis not present

## 2020-04-11 DIAGNOSIS — R5383 Other fatigue: Secondary | ICD-10-CM

## 2020-04-11 DIAGNOSIS — M79642 Pain in left hand: Secondary | ICD-10-CM

## 2020-04-11 DIAGNOSIS — F419 Anxiety disorder, unspecified: Secondary | ICD-10-CM

## 2020-04-11 DIAGNOSIS — M79641 Pain in right hand: Secondary | ICD-10-CM

## 2020-04-11 DIAGNOSIS — M79672 Pain in left foot: Secondary | ICD-10-CM

## 2020-04-11 DIAGNOSIS — F32A Depression, unspecified: Secondary | ICD-10-CM

## 2020-04-11 DIAGNOSIS — O1493 Unspecified pre-eclampsia, third trimester: Secondary | ICD-10-CM

## 2020-04-11 DIAGNOSIS — Z79899 Other long term (current) drug therapy: Secondary | ICD-10-CM | POA: Diagnosis not present

## 2020-04-11 DIAGNOSIS — Z820 Family history of epilepsy and other diseases of the nervous system: Secondary | ICD-10-CM

## 2020-04-11 DIAGNOSIS — M79671 Pain in right foot: Secondary | ICD-10-CM

## 2020-04-11 DIAGNOSIS — Z8669 Personal history of other diseases of the nervous system and sense organs: Secondary | ICD-10-CM

## 2020-04-11 DIAGNOSIS — L509 Urticaria, unspecified: Secondary | ICD-10-CM

## 2020-04-11 DIAGNOSIS — Z8719 Personal history of other diseases of the digestive system: Secondary | ICD-10-CM

## 2020-04-11 DIAGNOSIS — R0602 Shortness of breath: Secondary | ICD-10-CM

## 2020-04-11 NOTE — Patient Instructions (Signed)
Standing Labs We placed an order today for your standing lab work.   Please have your standing labs drawn in February and every 3 months    If possible, please have your labs drawn 2 weeks prior to your appointment so that the provider can discuss your results at your appointment.  We have open lab daily Monday through Thursday from 8:30-12:30 PM and 1:30-4:30 PM and Friday from 8:30-12:30 PM and 1:30-4:00 PM at the office of Dr. Azelie Noguera, [facility].   Please be advised, patients with office appointments requiring lab work will take precedents over walk-in lab work.  If possible, please come for your lab work on Monday and Friday afternoons, as you may experience shorter wait times. The office is located at [address], [address], [place],  [address] No appointment is necessary.   Labs are drawn by [facility]. Please bring your co-pay at the time of your lab draw.  You may receive a bill from [facility] for your lab work.  If you wish to have your labs drawn at another location, please call the office 24 hours in advance to send orders.  If you have any questions regarding directions or hours of operation,  please call [phone].   As a reminder, please drink plenty of water prior to coming for your lab work. Thanks!   

## 2020-04-16 ENCOUNTER — Other Ambulatory Visit: Payer: Self-pay | Admitting: Rheumatology

## 2020-04-16 MED FILL — ESOMEPRAZOLE MAG DR 40 MG C: 40 | 30 days supply | Qty: 30 | Fill #3

## 2020-04-17 ENCOUNTER — Other Ambulatory Visit: Payer: Self-pay | Admitting: Physician Assistant

## 2020-04-17 ENCOUNTER — Ambulatory Visit (INDEPENDENT_AMBULATORY_CARE_PROVIDER_SITE_OTHER): Payer: No Typology Code available for payment source | Admitting: Family Medicine

## 2020-04-17 ENCOUNTER — Encounter: Payer: Self-pay | Admitting: Family Medicine

## 2020-04-17 ENCOUNTER — Other Ambulatory Visit: Payer: Self-pay | Admitting: Rheumatology

## 2020-04-17 ENCOUNTER — Other Ambulatory Visit: Payer: Self-pay

## 2020-04-17 VITALS — BP 110/90 | HR 97 | Temp 98.2°F | Ht 63.0 in | Wt 160.5 lb

## 2020-04-17 DIAGNOSIS — E785 Hyperlipidemia, unspecified: Secondary | ICD-10-CM

## 2020-04-17 DIAGNOSIS — R0602 Shortness of breath: Secondary | ICD-10-CM | POA: Diagnosis not present

## 2020-04-17 DIAGNOSIS — R002 Palpitations: Secondary | ICD-10-CM

## 2020-04-17 DIAGNOSIS — E162 Hypoglycemia, unspecified: Secondary | ICD-10-CM | POA: Diagnosis not present

## 2020-04-17 DIAGNOSIS — E1169 Type 2 diabetes mellitus with other specified complication: Secondary | ICD-10-CM

## 2020-04-17 MED FILL — LEVOCETIRIZINE 5 MG TABLET: 5 | 30 days supply | Qty: 60 | Fill #2

## 2020-04-17 MED FILL — HYDROXYCHLOROQUINE 200 MG T: 200 | 84 days supply | Qty: 120 | Fill #0

## 2020-04-17 MED FILL — DULoxetine HCL 30 MG CPEP: 30 | 30 days supply | Qty: 60 | Fill #1

## 2020-04-17 NOTE — Progress Notes (Signed)
Lindsay Wong DOB: 01-09-1981 Encounter date: 04/17/2020  This is a 39 y.o. female who presents with Chief Complaint  Patient presents with  . Shortness of Breath    History of present illness: Shortness of breath mentioned in recent rheumatology note.  Feeling better with plaquenil in terms of fatigue and joint issue. Has even been able to start back with kick boxing. At kick boxing within 30 seconds starts getting burning of muscles and has to stop and rest. Heart rate is staying really high while she is there. Feels like HR is higher than it should be for effort (170-180) and then drops suddenly mid way through exercise - has happened a couple of times. Does feel short of breath when it is running that high. Sometimes will get short of breath with heart racing when doing things around house. Has been going on for awhile.  Happens at least a couple days a week.  Initially appointment was made for the low sugar she had on bloodwork. bloodwork was about 3 hours after eating.     Seems to still gain weight even though she is eating 1100-1200 cal daily.   Allergies  Allergen Reactions  . Clarithromycin Shortness Of Breath    REACTION: hives  . Peg-2000 (Covid-19 Mrna Vaccine Component) Anaphylaxis  . Latex     Skin irritation from gloves   Current Meds  Medication Sig  . albuterol (VENTOLIN HFA) 108 (90 Base) MCG/ACT inhaler Inhale 2 puffs into the lungs every 4 (four) hours as needed for wheezing or shortness of breath.  Marland Kitchen amLODipine (NORVASC) 5 MG tablet TAKE 1 TABLET (5 MG TOTAL) BY MOUTH DAILY.  Marland Kitchen Cyanocobalamin (B-12 PO) Take by mouth daily.  . DULoxetine (CYMBALTA) 30 MG capsule Take 30 mg by mouth every morning.  Marland Kitchen esomeprazole (NEXIUM) 40 MG capsule TAKE 1 CAPSULE (40 MG TOTAL) BY MOUTH DAILY.  Marland Kitchen Ferrous Sulfate (IRON PO) Take by mouth 3 (three) times a week.  . hydroxychloroquine (PLAQUENIL) 200 MG tablet TAKE 1 TABLET BY MOUTH TWICE DAILY MONDAY THRU FRIDAY ONLY  .  levocetirizine (XYZAL) 5 MG tablet Take 1 tablet (5 mg total) by mouth 2 (two) times daily as needed for allergies.  Marland Kitchen linaclotide (LINZESS) 72 MCG capsule Take 1 capsule (72 mcg total) by mouth daily before breakfast.  . lubiprostone (AMITIZA) 8 MCG capsule Take 1 capsule (8 mcg total) by mouth 2 (two) times daily with a meal.  . naproxen sodium (ALEVE) 220 MG tablet Take 220 mg by mouth as needed.   Marland Kitchen VITAMIN D PO Take 2,000 Units by mouth daily.    Current Facility-Administered Medications for the 04/17/20 encounter (Office Visit) with Wynn Banker, MD  Medication  . 0.9 %  sodium chloride infusion    Review of Systems  Constitutional: Negative for chills, fatigue and fever.  Respiratory: Positive for shortness of breath (when heart is racing). Negative for cough, chest tightness and wheezing.   Cardiovascular: Positive for palpitations. Negative for chest pain and leg swelling.  Musculoskeletal: Positive for arthralgias (joints feel tremendously better with plaquenil).    Objective:  BP 110/90 (BP Location: Left Arm, Patient Position: Sitting, Cuff Size: Normal)   Pulse 97   Temp 98.2 F (36.8 C) (Oral)   Ht 5\' 3"  (1.6 m)   Wt 160 lb 8 oz (72.8 kg)   SpO2 99%   BMI 28.43 kg/m   Weight: 160 lb 8 oz (72.8 kg)   BP Readings from Last 3 Encounters:  04/17/20 110/90  04/11/20 132/82  02/25/20 (!) 144/95   Wt Readings from Last 3 Encounters:  04/17/20 160 lb 8 oz (72.8 kg)  04/11/20 160 lb 9.6 oz (72.8 kg)  02/25/20 159 lb (72.1 kg)    Physical Exam Constitutional:      General: She is not in acute distress.    Appearance: She is well-developed.  Cardiovascular:     Rate and Rhythm: Normal rate and regular rhythm.     Heart sounds: Normal heart sounds. No murmur heard.  No friction rub.  Pulmonary:     Effort: Pulmonary effort is normal. No respiratory distress.     Breath sounds: Normal breath sounds. No wheezing or rales.  Musculoskeletal:     Right lower  leg: No edema.     Left lower leg: No edema.  Neurological:     Mental Status: She is alert and oriented to person, place, and time.  Psychiatric:        Behavior: Behavior normal.     Assessment/Plan  1. Palpitations EKG shows normal sinus rhythm without any acute changes.  No prior for comparison.  I would like to get further evaluation with echo as well as event monitor.   - ECHOCARDIOGRAM COMPLETE; Future - Cardiac event monitor; Future - EKG 12-Lead  2. Shortness of breath Breathing issues only occurring when heart rate is elevated.  Lung exam is normal in the office today.  We will start with cardiac work-up and consider further evaluation pending this. - EKG 12-Lead  3. Hypoglycemia On review of hypoglycemia and blood work, patient states that she has always had trouble sugar dropping.  She had eaten approximately 3 hours prior to lab work, so she was essentially back down to baseline with glucose.  Encouraged her to eat small meals frequently.  We will check A1c to make sure stable. - Hemoglobin A1c; Future - Hemoglobin A1c  4. Hyperlipidemia associated with type 2 diabetes mellitus (HCC) - Lipid panel; Future - Lipid panel    Return for pending labs/cardiac studies.  Theodis Shove, MD

## 2020-04-17 NOTE — Telephone Encounter (Signed)
Last Visit: 04/11/2020 Next Visit: 07/03/2020 Labs: 03/27/2020 Glucose low at 58. Rest of CMP WNL. CBC WNL.  Eye exam: 03/23/2020 WNL  Current Dose per office note 04/11/2020: PLQ 200 mg BID M-F DX: Autoimmune disease   Okay to refill per Dr. Corliss Skains

## 2020-04-18 LAB — LIPID PANEL
Cholesterol: 194 mg/dL (ref <200)
HDL: 55 mg/dL (ref >=50)
LDL Cholesterol (Calc): 101 mg/dL (calc) — ABNORMAL HIGH
Non-HDL Cholesterol (Calc): 139 mg/dL (calc) — ABNORMAL HIGH (ref <130)
Total CHOL/HDL Ratio: 3.5 (calc) (ref <5.0)
Triglycerides: 267 mg/dL — ABNORMAL HIGH (ref <150)

## 2020-04-18 LAB — HEMOGLOBIN A1C
Hgb A1c MFr Bld: 5.2 % of total Hgb (ref <5.7)
Mean Plasma Glucose: 103 mg/dL
eAG (mmol/L): 5.7 mmol/L

## 2020-04-24 ENCOUNTER — Ambulatory Visit (INDEPENDENT_AMBULATORY_CARE_PROVIDER_SITE_OTHER): Payer: No Typology Code available for payment source

## 2020-04-24 DIAGNOSIS — R002 Palpitations: Secondary | ICD-10-CM

## 2020-04-25 ENCOUNTER — Other Ambulatory Visit (HOSPITAL_COMMUNITY): Payer: Self-pay | Admitting: Psychology

## 2020-05-01 MED FILL — LUBIPROSTONE 8 MCG CAPS: 8 | 30 days supply | Qty: 60 | Fill #2

## 2020-05-03 NOTE — Telephone Encounter (Signed)
Deb - can you advise on this/help if need be? I had ordered the monitor. I'm not sure if more/separate referral is needed through insurance. Please let patient know if all is ok or let me know know if there is something else I need to do. I've never had to enter separate referral before.

## 2020-05-18 ENCOUNTER — Ambulatory Visit (HOSPITAL_COMMUNITY): Payer: No Typology Code available for payment source | Attending: Internal Medicine

## 2020-05-18 ENCOUNTER — Other Ambulatory Visit: Payer: Self-pay

## 2020-05-18 DIAGNOSIS — R002 Palpitations: Secondary | ICD-10-CM | POA: Insufficient documentation

## 2020-05-18 LAB — ECHOCARDIOGRAM COMPLETE
Area-P 1/2: 4.8 cm2
S' Lateral: 2.5 cm

## 2020-05-24 ENCOUNTER — Other Ambulatory Visit: Payer: Self-pay | Admitting: Family Medicine

## 2020-05-24 MED FILL — LEVOCETIRIZINE 5 MG TABLET: 5 | 30 days supply | Qty: 60 | Fill #3

## 2020-05-24 MED FILL — ESOMEPRAZOLE MAG DR 40 MG C: 40 | 30 days supply | Qty: 30 | Fill #0

## 2020-05-24 MED FILL — DULoxetine HCL 30 MG CPEP: 30 | 90 days supply | Qty: 180 | Fill #0

## 2020-06-05 MED FILL — LUBIPROSTONE 8 MCG CAPS: 8 | 30 days supply | Qty: 60 | Fill #3

## 2020-06-19 ENCOUNTER — Other Ambulatory Visit: Payer: Self-pay | Admitting: Allergy

## 2020-06-19 ENCOUNTER — Other Ambulatory Visit: Payer: Self-pay

## 2020-06-19 ENCOUNTER — Other Ambulatory Visit: Payer: Self-pay | Admitting: Physician Assistant

## 2020-06-19 DIAGNOSIS — M79642 Pain in left hand: Secondary | ICD-10-CM

## 2020-06-19 DIAGNOSIS — R5383 Other fatigue: Secondary | ICD-10-CM

## 2020-06-19 DIAGNOSIS — I7300 Raynaud's syndrome without gangrene: Secondary | ICD-10-CM

## 2020-06-19 DIAGNOSIS — M170 Bilateral primary osteoarthritis of knee: Secondary | ICD-10-CM

## 2020-06-19 DIAGNOSIS — M79641 Pain in right hand: Secondary | ICD-10-CM

## 2020-06-19 DIAGNOSIS — M359 Systemic involvement of connective tissue, unspecified: Secondary | ICD-10-CM

## 2020-06-19 MED FILL — LEVOCETIRIZINE 5 MG TABLET: 5 | 30 days supply | Qty: 30 | Fill #0

## 2020-06-19 MED FILL — AMLODIPINE BESYLATE 5 MG TA: 5 | 90 days supply | Qty: 90 | Fill #0

## 2020-06-19 MED FILL — ESOMEPRAZOLE MAG DR 40 MG C: 40 | 30 days supply | Qty: 30 | Fill #1

## 2020-06-19 NOTE — Telephone Encounter (Signed)
Last Visit: 04/11/2020 Next Visit: 07/03/2020  Current Dose per office note on 04/11/2020, not discussed Dx: : Autoimmune disease   Last Fill:03/13/2020  Okay to refill Amlodipine?

## 2020-06-19 NOTE — Progress Notes (Signed)
Office Visit Note  Patient: Lindsay Wong             Date of Birth: 10-08-1980           MRN: [id number]             PCP: Wynn Banker, MD Referring: Wynn Banker, MD Visit Date: 07/03/2020 Occupation: @GUAROCC @  Subjective:  Medication Management   History of Present Illness: Lindsay Wong is a 40 y.o. female with a history of autoimmune disease.  She states she continues to do well on hydroxychloroquine.  She still have some Raynaud's symptoms which are manageable.  She denies having any digital ulcers.  She states about 1 week ago she sprained her neck which responded to meclizine and muscle relaxers.  She is not taking any NSAIDs anymore.  She has been active and doing exercise on a regular basis.  Activities of Daily Living:  Patient reports morning stiffness for 0 minutes.   Patient Denies nocturnal pain.  Difficulty dressing/grooming: Denies Difficulty climbing stairs: Denies Difficulty getting out of chair: Denies Difficulty using hands for taps, buttons, cutlery, and/or writing: Denies  Review of Systems  Constitutional: Negative for fatigue, night sweats, weight gain and weight loss.  HENT: Negative for mouth sores, trouble swallowing, trouble swallowing, mouth dryness and nose dryness.   Eyes: Positive for dryness. Negative for pain, redness, itching and visual disturbance.  Respiratory: Negative for cough, shortness of breath and difficulty breathing.   Cardiovascular: Negative for chest pain, palpitations, hypertension, irregular heartbeat and swelling in legs/feet.  Gastrointestinal: Negative for blood in stool, constipation and diarrhea.  Endocrine: Negative for increased urination.  Genitourinary: Negative for difficulty urinating and vaginal dryness.  Musculoskeletal: Positive for myalgias and myalgias. Negative for arthralgias, joint pain, joint swelling, muscle weakness, morning stiffness and muscle tenderness.  Skin: Positive for color change.  Negative for rash, hair loss, redness, skin tightness, ulcers and sensitivity to sunlight.  Allergic/Immunologic: Negative for susceptible to infections.  Neurological: Positive for numbness. Negative for dizziness, headaches, memory loss, night sweats and weakness.  Hematological: Positive for bruising/bleeding tendency. Negative for swollen glands.  Psychiatric/Behavioral: Negative for depressed mood, confusion and sleep disturbance. The patient is not nervous/anxious.     PMFS History:  Patient Active Problem List   Diagnosis Date Noted  . Adverse effect of other viral vaccines, subsequent encounter 01/27/2020  . Spinal stenosis of cervical region 12/28/2019  . Fatigue 08/20/2019  . Anxiety and depression 05/19/2017  . Left shoulder pain 07/19/2014  . RAYNAUD'S DISEASE 01/31/2007    Past Medical History:  Diagnosis Date  . Allergy   . Anxiety and depression    per patient diagnosed by psychiatrist  . Appendicitis 07/20/2012  . Blood in stool   . Chicken pox   . Depression    postpartum with one child  . Fatty liver   . GERD 01/31/2007   Qualifier: Diagnosis of  By: 02/02/2007 RMA, [name]    . Hyperlipidemia    postpartum  . PONV (postoperative nausea and vomiting)   . Positive ANA (antinuclear antibody)   . RAYNAUD'S DISEASE 01/31/2007   Qualifier: Diagnosis of  By: 02/02/2007 RMA, [name]    . Urticaria     Family History  Problem Relation Age of Onset  . Early menopause Mother   . Other Father        no contact since infant  . Raynaud syndrome Sister   . Cancer Maternal Grandmother  melanoma  . Stroke Maternal Grandmother 53  . Arthritis Maternal Grandmother   . Hyperlipidemia Maternal Grandmother   . Hypertension Maternal Grandmother   . Breast cancer Maternal Grandmother 72  . Cancer Maternal Grandfather        bladder  . Bladder Cancer Maternal Grandfather   . Lung cancer Other        all but one of grandmother's siblings   . Lupus Maternal Uncle   . Leukemia  Maternal Uncle   . Multiple sclerosis Cousin   . Autism Son   . Healthy Son   . Healthy Son   . Healthy Son   . Immunodeficiency Maternal Aunt   . Heart attack Maternal Aunt 49  . CAD Maternal Aunt   . Colon cancer Neg Hx   . Esophageal cancer Neg Hx   . Stomach cancer Neg Hx   . Rectal cancer Neg Hx    Past Surgical History:  Procedure Laterality Date  . CESAREAN SECTION     x3  . COLONOSCOPY  01/2020  . LAPAROSCOPIC APPENDECTOMY Right 07/20/2012   Procedure: APPENDECTOMY LAPAROSCOPIC;  Surgeon: Velora Heckler, MD;  Location: [facility];  Service: General;  Laterality: Right;  . 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 Influenza Split 01/04/2011  . Influenza Whole 04/07/2006, 02/01/2008  . Influenza,inj,Quad PF,6+ Mos 01/14/2014, 02/08/2015, 02/20/2016, 02/12/2017, 02/13/2018, 03/01/2019  . Influenza-Unspecified 01/11/2013, 02/11/2016, 03/13/2020  . Janssen (J&J) SARS-COV-2 Vaccination 01/28/2020, 04/29/2020  . Tdap 03/01/2019     Objective: Vital Signs: BP (!) 147/82 (BP Location: Left Arm, Patient Position: Sitting, Cuff Size: Normal)   Pulse (!) 106   Resp 13   Ht 5\' 3"  (1.6 m)   Wt 157 lb (71.2 kg)   LMP 06/24/2020 (Exact Date)   BMI 27.81 kg/m    Physical Exam Vitals and nursing note reviewed.  Constitutional:      Appearance: She is well-developed and well-nourished.  HENT:     Head: Normocephalic and atraumatic.  Eyes:     Extraocular Movements: EOM normal.     Conjunctiva/sclera: Conjunctivae normal.  Cardiovascular:     Rate and Rhythm: Normal rate and regular rhythm.     Pulses: Intact distal  pulses.     Heart sounds: Normal heart sounds.  Pulmonary:     Effort: Pulmonary effort is normal.     Breath sounds: Normal breath sounds.  Abdominal:     General: Bowel sounds are normal.     Palpations: Abdomen is soft.  Musculoskeletal:     Cervical back: Normal range of motion.  Lymphadenopathy:     Cervical: No cervical adenopathy.  Skin:    General: Skin is warm and dry.     Capillary Refill: Capillary refill takes less than 2 seconds.  Neurological:     Mental Status: She is alert and oriented to person, place, and time.  Psychiatric:        Mood and Affect: Mood and affect normal.  Behavior: Behavior normal.      Musculoskeletal Exam: C-spine was in good range of motion.  Shoulder joints, elbow joints, wrist joints, MCPs PIPs and DIPs with good range of motion with no synovitis.  Hip joints, knee joints, ankles, MTPs and PIPs with good range of motion with no synovitis.  CDAI Exam: CDAI Score: - Patient Global: -; Provider Global: - Swollen: -; Tender: - Joint Exam 07/03/2020   No joint exam has been documented for this visit   There is currently no information documented on the homunculus. Go to the Rheumatology activity and complete the homunculus joint exam.  Investigation: No additional findings.  Imaging: No results found.  Recent Labs: Lab Results  Component Value Date   WBC 6.8 06/30/2020   HGB 12.5 06/30/2020   PLT 317.0 06/30/2020   NA 139 06/30/2020   K 4.6 06/30/2020   CL 103 06/30/2020   CO2 29 06/30/2020   GLUCOSE 88 06/30/2020   BUN 9 06/30/2020   CREATININE 0.84 06/30/2020   BILITOT 0.3 06/30/2020   ALKPHOS 76 06/30/2020   AST 13 06/30/2020   ALT 17 06/30/2020   PROT 7.2 06/30/2020   ALBUMIN 4.3 06/30/2020   CALCIUM 9.7 06/30/2020   GFRAA 119 06/19/2020    Speciality Comments: PLQ Eye Exam: 03/23/2020 WNL @ [facility] Follow up in 1 year  Procedures:  No procedures performed Allergies: Clarithromycin,  Peg-2000 (covid-19 mrna vaccine component), and Latex   Assessment / Plan:     Visit Diagnoses: Autoimmune disease (HCC) - ANA 1: 640 speckled pattern, ENA negative, C3-C4 normal, anticardiolipin, beta-2, LA-,  History of fatigue,oral ulcers, arthralgias, photosensitivity, sicca sym -she is feeling much better since she started taking Plaquenil.  She has noticed improvement in fatigue, arthralgias, myalgias, sicca symptoms.  She has been very active and participating in her workouts.  Plan: Urinalysis, Routine w reflex microscopic, ANA, Anti-DNA antibody, double-stranded, C3 and C4, Sedimentation rate  High risk medication use - PLQ 200 mg BID M-F. PLQ Eye Exam: 03/23/2020 - Plan: CBC with Differential/Platelet, COMPLETE METABOLIC PANEL WITH GFR.  Her labs were normal recently.  She will get labs again in July.  Her last eye examination was on March 23, 2020.  Raynaud's syndrome without gangrene-Raynauds symptoms are mild although she has no symptoms during the winter season.  Primary osteoarthritis of both knees - Moderate OA.  She is currently not having any discomfort in her knee joints.  Other fatigue-improved since she has been on Plaquenil.  Elevated blood pressure reading-I have advised her to monitor blood pressure over the next 2 weeks and then follow-up with her PCP.  History of esophageal reflux-she takes Nexium for reflux.  Pre-eclampsia in third trimester  History of migraine  Anxiety and depression  Family history of multiple sclerosis  She is fully vaccinated against COVID-19.  Use of mask, social distancing and hand hygiene was discussed.  Orders: Orders Placed This Encounter  Procedures  . CBC with Differential/Platelet  . COMPLETE METABOLIC PANEL WITH GFR  . Urinalysis, Routine w reflex microscopic  . ANA  . Anti-DNA antibody, double-stranded  . C3 and C4  . Sedimentation rate   No orders of the defined types were placed in this encounter.    Follow-Up  Instructions: Return in about 5 months (around 11/30/2020) for Autoimmune disease.   Pollyann Savoy, MD  Note - This record has been created using Animal nutritionist.  Chart creation errors have been sought, but may not always  have  been located. Such creation errors do not reflect on  the standard of medical care.

## 2020-06-20 LAB — COMPLETE METABOLIC PANEL WITH GFR
AG Ratio: 1.6 (calc) (ref 1.0–2.5)
ALT: 20 U/L (ref 6–29)
AST: 20 U/L (ref 10–30)
Albumin: 4.3 g/dL (ref 3.6–5.1)
Alkaline phosphatase (APISO): 69 U/L (ref 31–125)
BUN: 15 mg/dL (ref 7–25)
CO2: 26 mmol/L (ref 20–32)
Calcium: 9.5 mg/dL (ref 8.6–10.2)
Chloride: 102 mmol/L (ref 98–110)
Creat: 0.73 mg/dL (ref 0.50–1.10)
GFR, Est African American: 119 mL/min/{1.73_m2} (ref >=60)
GFR, Est Non African American: 102 mL/min/{1.73_m2} (ref >=60)
Globulin: 2.7 g/dL (calc) (ref 1.9–3.7)
Glucose, Bld: 83 mg/dL (ref 65–99)
Potassium: 4.7 mmol/L (ref 3.5–5.3)
Sodium: 135 mmol/L (ref 135–146)
Total Bilirubin: 0.6 mg/dL (ref 0.2–1.2)
Total Protein: 7 g/dL (ref 6.1–8.1)

## 2020-06-20 LAB — CBC WITH DIFFERENTIAL/PLATELET
Absolute Monocytes: 706 cells/uL (ref 200–950)
Basophils Absolute: 34 cells/uL (ref 0–200)
Basophils Relative: 0.4 %
Eosinophils Absolute: 92 cells/uL (ref 15–500)
Eosinophils Relative: 1.1 %
HCT: 35.2 % (ref 35.0–45.0)
Hemoglobin: 11.7 g/dL (ref 11.7–15.5)
Lymphs Abs: 2453 cells/uL (ref 850–3900)
MCH: 27.5 pg (ref 27.0–33.0)
MCHC: 33.2 g/dL (ref 32.0–36.0)
MCV: 82.8 fL (ref 80.0–100.0)
MPV: 11.5 fL (ref 7.5–12.5)
Monocytes Relative: 8.4 %
Neutro Abs: 5116 cells/uL (ref 1500–7800)
Neutrophils Relative %: 60.9 %
Platelets: 329 10*3/uL (ref 140–400)
RBC: 4.25 10*6/uL (ref 3.80–5.10)
RDW: 14.5 % (ref 11.0–15.0)
Total Lymphocyte: 29.2 %
WBC: 8.4 10*3/uL (ref 3.8–10.8)

## 2020-06-27 ENCOUNTER — Other Ambulatory Visit (HOSPITAL_COMMUNITY): Payer: Self-pay | Admitting: Physician Assistant

## 2020-06-30 ENCOUNTER — Other Ambulatory Visit: Payer: Self-pay | Admitting: Family Medicine

## 2020-06-30 ENCOUNTER — Ambulatory Visit (INDEPENDENT_AMBULATORY_CARE_PROVIDER_SITE_OTHER): Payer: No Typology Code available for payment source | Admitting: Family Medicine

## 2020-06-30 ENCOUNTER — Other Ambulatory Visit: Payer: Self-pay

## 2020-06-30 ENCOUNTER — Encounter: Payer: Self-pay | Admitting: Family Medicine

## 2020-06-30 VITALS — BP 120/78 | HR 80 | Temp 97.5°F | Ht 63.0 in | Wt 155.2 lb

## 2020-06-30 DIAGNOSIS — M542 Cervicalgia: Secondary | ICD-10-CM | POA: Diagnosis not present

## 2020-06-30 DIAGNOSIS — M359 Systemic involvement of connective tissue, unspecified: Secondary | ICD-10-CM | POA: Diagnosis not present

## 2020-06-30 DIAGNOSIS — E538 Deficiency of other specified B group vitamins: Secondary | ICD-10-CM

## 2020-06-30 DIAGNOSIS — R42 Dizziness and giddiness: Secondary | ICD-10-CM | POA: Diagnosis not present

## 2020-06-30 LAB — CBC WITH DIFFERENTIAL/PLATELET
Basophils Absolute: 0 10*3/uL (ref 0.0–0.1)
Basophils Relative: 0.7 % (ref 0.0–3.0)
Eosinophils Absolute: 0.2 10*3/uL (ref 0.0–0.7)
Eosinophils Relative: 2.3 % (ref 0.0–5.0)
HCT: 37.3 % (ref 36.0–46.0)
Hemoglobin: 12.5 g/dL (ref 12.0–15.0)
Lymphocytes Relative: 30.2 % (ref 12.0–46.0)
Lymphs Abs: 2.1 10*3/uL (ref 0.7–4.0)
MCHC: 33.4 g/dL (ref 30.0–36.0)
MCV: 82 fl (ref 78.0–100.0)
Monocytes Absolute: 0.5 10*3/uL (ref 0.1–1.0)
Monocytes Relative: 7 % (ref 3.0–12.0)
Neutro Abs: 4.1 10*3/uL (ref 1.4–7.7)
Neutrophils Relative %: 59.8 % (ref 43.0–77.0)
Platelets: 317 10*3/uL (ref 150.0–400.0)
RBC: 4.54 Mil/uL (ref 3.87–5.11)
RDW: 15.5 % (ref 11.5–15.5)
WBC: 6.8 10*3/uL (ref 4.0–10.5)

## 2020-06-30 LAB — COMPREHENSIVE METABOLIC PANEL
ALT: 17 U/L (ref 0–35)
AST: 13 U/L (ref 0–37)
Albumin: 4.3 g/dL (ref 3.5–5.2)
Alkaline Phosphatase: 76 U/L (ref 39–117)
BUN: 9 mg/dL (ref 6–23)
CO2: 29 mEq/L (ref 19–32)
Calcium: 9.7 mg/dL (ref 8.4–10.5)
Chloride: 103 mEq/L (ref 96–112)
Creatinine, Ser: 0.84 mg/dL (ref 0.40–1.20)
GFR: 86.02 mL/min (ref >60.00)
Glucose, Bld: 88 mg/dL (ref 70–99)
Potassium: 4.6 mEq/L (ref 3.5–5.1)
Sodium: 139 mEq/L (ref 135–145)
Total Bilirubin: 0.3 mg/dL (ref 0.2–1.2)
Total Protein: 7.2 g/dL (ref 6.0–8.3)

## 2020-06-30 LAB — SEDIMENTATION RATE: Sed Rate: 17 mm/hr (ref 0–20)

## 2020-06-30 LAB — VITAMIN B12: Vitamin B-12: 1226 pg/mL — ABNORMAL HIGH (ref 211–911)

## 2020-06-30 LAB — C-REACTIVE PROTEIN: CRP: <1 mg/dL (ref 0.5–20.0)

## 2020-06-30 LAB — TSH: TSH: 2.23 u[IU]/mL (ref 0.35–4.50)

## 2020-06-30 MED ORDER — METHOCARBAMOL 500 MG PO TABS: 500.0000 mg | ORAL_TABLET | Freq: Three times a day (TID) | ORAL | 0 refills | Status: DC | PRN

## 2020-06-30 MED ORDER — MECLIZINE HCL 25 MG PO TABS: 25.0000 mg | ORAL_TABLET | Freq: Three times a day (TID) | ORAL | 0 refills | Status: DC | PRN

## 2020-06-30 MED FILL — MECLIZINE HCL 25 MG TABS: 25 | 10 days supply | Qty: 30 | Fill #0

## 2020-06-30 MED FILL — METHOCARBAMOL 500 MG TABS: 500 | 10 days supply | Qty: 30 | Fill #0

## 2020-06-30 NOTE — Progress Notes (Signed)
Lindsay Wong DOB: 1981-04-19 Encounter date: 06/30/2020  This is a 40 y.o. female who presents with Chief Complaint  Patient presents with  . Dizziness    Patient complains of dizziness x3 days  . Neck Pain    Patient complains of neck pain x3 days, has tried Ibuprofen with no relief.     History of present illness: Tuesday started with pain in neck, numbness/tingling that was going into face. Longer up and doing things getting really dizzy. Can hear some grinding/clicking in neck when she is turning head. In general she has done well overall with the plaquenil. She is back to kick boxing, exercising since starting the plaquenil (except this week)  Tuesday woke up tired, dizzy and then with neck pain. No known injury. When she had MRI done in past she did have some cervical stenosis. (this has been done in last year). Has had some brain fog in last couple of days. Has had a headache, but not severe. Has had tinnitus; but this is chronic but a little more often.   Has had episodes of vertigo in past; this feels like it is worst it has been. 2 days ago was seeing flashes/floaters in eyes. Not seen them today. Mostly just laying down today. As soon as she is up doing something then neck acts up/dizziness worsens. Has taken ibuprofen, but hasn't noted relief with this.   Vertigo does stop when she lays down. She feels like not holding head up does help.   Allergies  Allergen Reactions  . Clarithromycin Shortness Of Breath    REACTION: hives  . Peg-2000 (Covid-19 Mrna Vaccine Component) Anaphylaxis  . Latex     Skin irritation from gloves   Current Meds  Medication Sig  . albuterol (VENTOLIN HFA) 108 (90 Base) MCG/ACT inhaler Inhale 2 puffs into the lungs every 4 (four) hours as needed for wheezing or shortness of breath.  Marland Kitchen amLODipine (NORVASC) 5 MG tablet TAKE 1 TABLET BY MOUTH DAILY.  Marland Kitchen Cyanocobalamin (B-12 PO) Take by mouth daily.  . DULoxetine (CYMBALTA) 30 MG capsule Take 30 mg  by mouth every morning.  Marland Kitchen esomeprazole (NEXIUM) 40 MG capsule TAKE 1 CAPSULE BY MOUTH ONCE DAILY  . Ferrous Sulfate (IRON PO) Take by mouth 3 (three) times a week.  . hydroxychloroquine (PLAQUENIL) 200 MG tablet TAKE 1 TABLET BY MOUTH TWICE DAILY MONDAY THRU FRIDAY ONLY  . levocetirizine (XYZAL) 5 MG tablet TAKE 1 TABLET BY MOUTH TWO TIMES DAILY AS NEEDED FOR ALLERGIES  . linaclotide (LINZESS) 72 MCG capsule Take 1 capsule (72 mcg total) by mouth daily before breakfast.  . lubiprostone (AMITIZA) 8 MCG capsule Take 1 capsule (8 mcg total) by mouth 2 (two) times daily with a meal.  . naproxen sodium (ALEVE) 220 MG tablet Take 220 mg by mouth as needed.   Marland Kitchen VITAMIN D PO Take 2,000 Units by mouth daily.    Current Facility-Administered Medications for the 06/30/20 encounter (Office Visit) with Wynn Banker, MD  Medication  . 0.9 %  sodium chloride infusion    Review of Systems  Constitutional: Negative for chills, fatigue and fever.  Respiratory: Negative for cough, chest tightness, shortness of breath and wheezing.   Cardiovascular: Negative for chest pain, palpitations and leg swelling.    Objective:  BP 120/78 (BP Location: Left Arm, Patient Position: Sitting, Cuff Size: Normal)   Pulse 80   Temp (!) 97.5 F (36.4 C) (Oral)   Ht 5\' 3"  (1.6 m)  Wt 155 lb 3.2 oz (70.4 kg)   LMP 06/24/2020 (Exact Date)   SpO2 99%   BMI 27.49 kg/m   Weight: 155 lb 3.2 oz (70.4 kg)   BP Readings from Last 3 Encounters:  06/30/20 120/78  04/17/20 110/90  04/11/20 132/82   Wt Readings from Last 3 Encounters:  06/30/20 155 lb 3.2 oz (70.4 kg)  04/17/20 160 lb 8 oz (72.8 kg)  04/11/20 160 lb 9.6 oz (72.8 kg)    Physical Exam Constitutional:      General: She is not in acute distress.    Appearance: She is well-developed and well-nourished. She is not diaphoretic.  HENT:     Head: Normocephalic and atraumatic.     Right Ear: External ear normal.     Left Ear: External ear normal.      Mouth/Throat:     Mouth: Oropharynx is clear and moist.  Eyes:     Conjunctiva/sclera: Conjunctivae normal.     Pupils: Pupils are equal, round, and reactive to light.  Neck:     Thyroid: No thyromegaly.  Cardiovascular:     Rate and Rhythm: Normal rate and regular rhythm.     Heart sounds: Normal heart sounds. No murmur heard. No friction rub. No gallop.   Pulmonary:     Effort: Pulmonary effort is normal. No respiratory distress.     Breath sounds: Normal breath sounds. No wheezing or rales.  Musculoskeletal:     Cervical back: Neck supple.     Comments: Spasm left trap, tender trap bilat. No spinal tenderness.   Lymphadenopathy:     Cervical: No cervical adenopathy.  Skin:    General: Skin is warm and dry.  Neurological:     Mental Status: She is alert and oriented to person, place, and time.     Cranial Nerves: No cranial nerve deficit.     Motor: Motor function is intact. No abnormal muscle tone.     Coordination: Romberg sign negative.     Gait: Gait is intact.     Deep Tendon Reflexes: Strength normal. Reflexes normal.     Reflex Scores:      Tricep reflexes are 2+ on the right side and 2+ on the left side.      Bicep reflexes are 3+ on the right side and 3+ on the left side.      Brachioradialis reflexes are 3+ on the right side and 3+ on the left side.      Patellar reflexes are 2+ on the right side and 2+ on the left side.    Comments: Slight vertigo with lying back and left side; none with right side. No noted nystagmus.  Psychiatric:        Mood and Affect: Mood and affect normal.        Behavior: Behavior normal.     Assessment/Plan  1. Vertigo Uncertain trigger; but she does have vertigo hx; current neck pain/spasm. We will work to help with neck pain and tx vertigo symptomatically. I will follow up with her pending results. - meclizine (ANTIVERT) 25 MG tablet; Take 1 tablet (25 mg total) by mouth 3 (three) times daily as needed for dizziness.  Dispense: 30  tablet; Refill: 0 - Comprehensive metabolic panel; Future - TSH; Future - Magnesium, RBC; Future  2. Neck pain She does have multiple areas of bone spur/disc height change, etc on MRI (outside facility; through ortho).  - methocarbamol (ROBAXIN) 500 MG tablet; Take 1 tablet (500 mg total)  by mouth every 8 (eight) hours as needed for muscle spasms.  Dispense: 30 tablet; Refill: 0 - CBC with Differential/Platelet; Future  3. Autoimmune disease (HCC) She has done great with plaquenil.  - Sedimentation rate; Future - C-reactive protein; Future  4. B12 deficiency She is taking oral replacement.  - Vitamin B12; Future    Return if symptoms worsen or fail to improve.    Theodis Shove, MD

## 2020-07-03 ENCOUNTER — Encounter: Payer: Self-pay | Admitting: Rheumatology

## 2020-07-03 ENCOUNTER — Ambulatory Visit: Payer: No Typology Code available for payment source | Admitting: Rheumatology

## 2020-07-03 ENCOUNTER — Other Ambulatory Visit: Payer: Self-pay

## 2020-07-03 ENCOUNTER — Ambulatory Visit: Payer: No Typology Code available for payment source | Admitting: Family Medicine

## 2020-07-03 VITALS — BP 147/82 | HR 106 | Resp 13 | Ht 63.0 in | Wt 157.0 lb

## 2020-07-03 DIAGNOSIS — M359 Systemic involvement of connective tissue, unspecified: Secondary | ICD-10-CM | POA: Diagnosis not present

## 2020-07-03 DIAGNOSIS — Z79899 Other long term (current) drug therapy: Secondary | ICD-10-CM

## 2020-07-03 DIAGNOSIS — I7300 Raynaud's syndrome without gangrene: Secondary | ICD-10-CM

## 2020-07-03 DIAGNOSIS — Z8719 Personal history of other diseases of the digestive system: Secondary | ICD-10-CM

## 2020-07-03 DIAGNOSIS — R5383 Other fatigue: Secondary | ICD-10-CM

## 2020-07-03 DIAGNOSIS — Z8669 Personal history of other diseases of the nervous system and sense organs: Secondary | ICD-10-CM

## 2020-07-03 DIAGNOSIS — M79671 Pain in right foot: Secondary | ICD-10-CM

## 2020-07-03 DIAGNOSIS — M170 Bilateral primary osteoarthritis of knee: Secondary | ICD-10-CM

## 2020-07-03 DIAGNOSIS — R030 Elevated blood-pressure reading, without diagnosis of hypertension: Secondary | ICD-10-CM

## 2020-07-03 DIAGNOSIS — Z820 Family history of epilepsy and other diseases of the nervous system: Secondary | ICD-10-CM

## 2020-07-03 DIAGNOSIS — R0602 Shortness of breath: Secondary | ICD-10-CM

## 2020-07-03 DIAGNOSIS — F32A Depression, unspecified: Secondary | ICD-10-CM

## 2020-07-03 DIAGNOSIS — L509 Urticaria, unspecified: Secondary | ICD-10-CM

## 2020-07-03 DIAGNOSIS — F419 Anxiety disorder, unspecified: Secondary | ICD-10-CM

## 2020-07-03 DIAGNOSIS — O1493 Unspecified pre-eclampsia, third trimester: Secondary | ICD-10-CM

## 2020-07-03 DIAGNOSIS — M79642 Pain in left hand: Secondary | ICD-10-CM

## 2020-07-03 NOTE — Patient Instructions (Signed)
Standing Labs We placed an order today for your standing lab work.   Please have your standing labs drawn in July  If possible, please have your labs drawn 2 weeks prior to your appointment so that the provider can discuss your results at your appointment.  We have open lab daily Monday through Thursday from 1:30-4:30 PM and Friday from 1:30-4:00 PM at the office of Dr. Nyna Chilton, [facility].   Please be advised, all patients with office appointments requiring lab work will take precedents over walk-in lab work.  If possible, please come for your lab work on Monday and Friday afternoons, as you may experience shorter wait times. The office is located at [address], [address], [place], [place] [address] No appointment is necessary.   Labs are drawn by [facility]. Please bring your co-pay at the time of your lab draw.  You may receive a bill from [facility] for your lab work.  If you wish to have your labs drawn at another location, please call the office 24 hours in advance to send orders.  If you have any questions regarding directions or hours of operation,  please call [phone].   As a reminder, please drink plenty of water prior to coming for your lab work. Thanks!  

## 2020-07-04 ENCOUNTER — Encounter: Payer: Self-pay | Admitting: Orthopaedic Surgery

## 2020-07-04 ENCOUNTER — Encounter: Payer: Self-pay | Admitting: Family Medicine

## 2020-07-04 ENCOUNTER — Ambulatory Visit: Payer: No Typology Code available for payment source | Admitting: Orthopaedic Surgery

## 2020-07-04 VITALS — BP 118/75 | HR 92 | Ht 63.0 in | Wt 155.0 lb

## 2020-07-04 DIAGNOSIS — M4802 Spinal stenosis, cervical region: Secondary | ICD-10-CM

## 2020-07-04 NOTE — Progress Notes (Signed)
Office Visit Note   Patient: Lindsay Wong           Date of Birth: 1980/07/17           MRN: [id number] Visit Date: 07/04/2020              Requested by: Wynn BankerKoberlein, Junell C, MD [address] [place],  [place] [id number] PCP: Wynn BankerKoberlein, Junell C, MD   Assessment & Plan: Visit Diagnoses:  1. Spinal stenosis of cervical region   2. Foraminal stenosis of cervical region     Plan: Patient is having progressive increase symptoms is moderate central stenosis severe foraminal stenosis.  Plan would be C4-5, C5-6 two-level cervical fusion with allograft and plate.  Observation status overnight.  She knows she will be in a collar for 6 weeks we discussed risks of surgery including dysphagia dysphonia, pseudoarthrosis.  Will call her about surgical scheduling she is taking either sooner or wait until spring break if she is not able to have enough family assistance.  Follow-Up Instructions: No follow-ups on file.   Orders:  No orders of the defined types were placed in this encounter.  No orders of the defined types were placed in this encounter.     Procedures: No procedures performed   Clinical Data: No additional findings.   Subjective: Chief Complaint  Patient presents with  . Neck - Pain, Follow-up    HPI 40 year old female mother of 3 teenage boys with ongoing problems with cervical moderate stenosis and severe foraminal stenosis at C4-5 and C5-6.  She had an episode last week where she had severe pain vertigo dizziness.  She was treated with meclizine and muscle relaxants.  Pain radiated into her left arm past her left elbow with numbness.  She states this is gotten slightly better but she still has chronic ongoing problems with neck pain and left greater than right arm pain.  Previous MRI showed moderate central stenosis at C5-6 and severe foraminal stenosis on the left.  Patient is noted gradually month the month she is having more frequent episodes of severe pain  accompanied with weakness.  She had is not able to reach up overhead put objects up in the closet due to neck pain arm pain and numbness.  She does fine if she is carrying things below her waist.  Patient has positive ANA she is on Plaquenil.  She denies gait disturbance no falling problems.  Review of Systems positive for positive ANA.  Treatment controlled depression with Cymbalta.  She is on some Nexium for GERD.  Cardiovascular respiratory negative.  All other systems are noncontributory to HPI.   Objective: Vital Signs: BP 118/75   Pulse 92   Ht 5\' 3"  (1.6 m)   Wt 155 lb (70.3 kg)   LMP 06/24/2020 (Exact Date)   BMI 27.46 kg/m   Physical Exam Constitutional:      Appearance: She is well-developed.  HENT:     Head: Normocephalic.     Right Ear: External ear normal.     Left Ear: External ear normal.  Eyes:     Pupils: Pupils are equal, round, and reactive to light.  Neck:     Thyroid: No thyromegaly.     Trachea: No tracheal deviation.  Cardiovascular:     Rate and Rhythm: Normal rate.  Pulmonary:     Effort: Pulmonary effort is normal.  Abdominal:     Palpations: Abdomen is soft.  Skin:    General: Skin is warm  and dry.  Neurological:     Mental Status: She is alert and oriented to person, place, and time.  Psychiatric:        Mood and Affect: Mood and affect normal.        Behavior: Behavior normal.     Ortho Exam patient has bilateral brachial plexus tenderness worse on left than right positive Spurling on the left increased pain with compression.  Positive Lhermitte.  2+ lower extremity reflexes.  Right upper extremity 3+ biceps triceps brachial radialis.  Left absent brachioradialis 2+ biceps 2+ triceps.  Negative impingement left shoulder.  Reproduction of neck and arm numbness with overhead outstretch reaching with both arms.  No lower extremity clonus.  Decreased bicep strength left versus right triceps are normal and symmetrical normal wrist flexion extension  and grip.  Specialty Comments:  No specialty comments available.  Imaging: CLINICAL DATA:  Initial evaluation for worsening headaches with cognitive changes, hyper reflexia, brain fog, migraines.  EXAM: MRI HEAD WITHOUT AND WITH CONTRAST  MRI CERVICAL SPINE WITHOUT AND WITH CONTRAST  TECHNIQUE: Multiplanar, multiecho pulse sequences of the brain and surrounding structures, and cervical spine, to include the craniocervical junction and cervicothoracic junction, were obtained without and with intravenous contrast.  CONTRAST:  63mL MULTIHANCE GADOBENATE DIMEGLUMINE 529 MG/ML IV SOLN  COMPARISON:  None available.  FINDINGS: MRI HEAD FINDINGS  Brain: Cerebral volume within normal limits for patient age. No focal parenchymal signal abnormality identified. Subcentimeter dilated perivascular space noted adjacent to the frontal horn of the left lateral ventricle.  No abnormal foci of restricted diffusion to suggest acute or subacute ischemia. Gray-white matter differentiation well maintained. No encephalomalacia to suggest chronic infarction. No foci of susceptibility artifact to suggest acute or chronic intracranial hemorrhage.  No mass lesion, midline shift or mass effect. No hydrocephalus. No extra-axial fluid collection. Major dural sinuses are grossly patent.  Pituitary gland and suprasellar region are normal. Midline structures intact and normal.  No abnormal enhancement.  Vascular: Major intracranial vascular flow voids well maintained and normal in appearance.  Skull and upper cervical spine: Craniocervical junction normal. Visualized upper cervical spine within normal limits. Bone marrow signal intensity normal. No scalp soft tissue abnormality.  Sinuses/Orbits: Globes and orbital soft tissues within normal limits.  Paranasal sinuses are clear. Trace left mastoid effusion noted, of doubtful significance. Inner ear structures normal.  Other:  None.  MRI CERVICAL SPINE FINDINGS  Alignment: Straightening of the normal cervical lordosis with underlying dextroscoliosis. Trace 2 mm retrolisthesis of C4 on C5, chronic and degenerative.  Vertebrae: Vertebral body height maintained without evidence for acute or chronic fracture. Bone marrow signal intensity within normal limits. Subcentimeter benign hemangioma noted within the T1 vertebral body. No worrisome osseous lesions. Discogenic reactive endplate changes noted about the C4-5 and C5-6 interspaces. No other abnormal marrow edema or enhancement.  Cord: Signal intensity within the cervical spinal cord is normal. No abnormal enhancement.  Posterior Fossa, vertebral arteries, paraspinal tissues: Craniocervical junction within normal limits. Paraspinous and prevertebral soft tissues are normal. Normal flow voids seen within the vertebral arteries bilaterally. Strongly dominant right vertebral artery noted.  Disc levels:  C2-C3: Unremarkable.  C3-C4:  Mild diffuse disc bulge.  No canal or foraminal stenosis.  C4-C5: Chronic intervertebral disc space narrowing with diffuse degenerative disc osteophyte, eccentric to the left. Broad posterior component flattens and partially effaces the ventral thecal sac and resultant moderate spinal stenosis. Mild cord flattening without cord signal changes. Severe left with moderate right C5 foraminal stenosis.  C5-C6: Chronic intervertebral disc space narrowing with diffuse degenerative disc osteophyte. Broad posterior component flattens and effaces the ventral thecal sac resultant moderate spinal stenosis. Mild cord flattening without cord signal changes. Severe right with moderate left C6 foraminal stenosis.  C6-C7: Mild disc bulge with superimposed small central disc protrusion indents the ventral thecal sac. No significant spinal stenosis or cord deformity. Foramina remain patent.  C7-T1:  Normal interspace.  Mild  facet hypertrophy.  No stenosis.  Visualized upper thoracic spine demonstrates mild disc bulging at T2-3 without significant stenosis.  IMPRESSION: MRI HEAD IMPRESSION:  Normal brain MRI for age. No findings to explain patient's symptoms identified.  MRI CERVICAL SPINE IMPRESSION:  1. Normal MRI appearance of the cervical spinal cord. No cord signal changes to suggest myelopathy or other abnormality. 2. Degenerative disc osteophyte complexes at C4-5 and C5-6 with resultant moderate spinal stenosis and mild cord flattening. Associated severe left with moderate right C5 foraminal stenosis, with severe right and moderate left C6 foraminal narrowing.   Electronically Signed   By: Rise Mu M.D.   On: 09/19/2019 07:58    PMFS History: Patient Active Problem List   Diagnosis Date Noted  . Foraminal stenosis of cervical region 07/04/2020  . Adverse effect of other viral vaccines, subsequent encounter 01/27/2020  . Spinal stenosis of cervical region 12/28/2019  . Fatigue 08/20/2019  . Anxiety and depression 05/19/2017  . Left shoulder pain 07/19/2014  . RAYNAUD'S DISEASE 01/31/2007   Past Medical History:  Diagnosis Date  . Allergy   . Anxiety and depression    per patient diagnosed by psychiatrist  . Appendicitis 07/20/2012  . Blood in stool   . Chicken pox   . Depression    postpartum with one child  . Fatty liver   . GERD 01/31/2007   Qualifier: Diagnosis of  By: Charlsie Quest RMA, [name]    . Hyperlipidemia    postpartum  . PONV (postoperative nausea and vomiting)   . Positive ANA (antinuclear antibody)   . RAYNAUD'S DISEASE 01/31/2007   Qualifier: Diagnosis of  By: Charlsie Quest RMA, [name]    . Urticaria     Family History  Problem Relation Age of Onset  . Early menopause Mother   . Other Father        no contact since infant  . Raynaud syndrome Sister   . Cancer Maternal Grandmother        melanoma  . Stroke Maternal Grandmother 70  . Arthritis Maternal  Grandmother   . Hyperlipidemia Maternal Grandmother   . Hypertension Maternal Grandmother   . Breast cancer Maternal Grandmother 72  . Cancer Maternal Grandfather        bladder  . Bladder Cancer Maternal Grandfather   . Lung cancer Other        all but one of grandmother's siblings   . Lupus Maternal Uncle   . Leukemia Maternal Uncle   . Multiple sclerosis Cousin   . Autism Son   . Healthy Son   . Healthy Son   . Healthy Son   . Immunodeficiency Maternal Aunt   . Heart attack Maternal Aunt 49  . CAD Maternal Aunt   . Colon cancer Neg Hx   . Esophageal cancer Neg Hx   . Stomach cancer Neg Hx   . Rectal cancer Neg Hx     Past Surgical History:  Procedure Laterality Date  . CESAREAN SECTION     x3  . COLONOSCOPY  01/2020  . LAPAROSCOPIC APPENDECTOMY Right  07/20/2012   Procedure: APPENDECTOMY LAPAROSCOPIC;  Surgeon: Velora Heckler, MD;  Location: [facility];  Service: General;  Laterality: Right;  . WISDOM TOOTH EXTRACTION     Social History   Occupational History  . Not on file  Tobacco Use  . Smoking status: Never Smoker  . Smokeless tobacco: Never Used  Vaping Use  . Vaping Use: Never used  Substance and Sexual Activity  . Alcohol use: No  . Drug use: No  . Sexual activity: Yes    Birth control/protection: None    Comment: husband had vasectomy

## 2020-07-05 ENCOUNTER — Encounter: Payer: Self-pay | Admitting: Orthopaedic Surgery

## 2020-07-05 LAB — MAGNESIUM, RBC: Magnesium RBC: 4.8 mg/dL (ref 4.0–6.4)

## 2020-07-10 ENCOUNTER — Encounter: Payer: Self-pay | Admitting: Family Medicine

## 2020-07-11 HISTORY — PX: ANTERIOR FUSION CERVICAL SPINE: SUR626

## 2020-07-14 ENCOUNTER — Other Ambulatory Visit: Payer: Self-pay | Admitting: Rheumatology

## 2020-07-14 ENCOUNTER — Other Ambulatory Visit: Payer: Self-pay | Admitting: Physician Assistant

## 2020-07-14 MED FILL — HYDROQUINONE 4% CREAM: 4 | 30 days supply | Qty: 28 | Fill #0

## 2020-07-14 MED FILL — HYDROXYCHLOROQUINE 200 MG T: 200 | 84 days supply | Qty: 120 | Fill #0

## 2020-07-14 NOTE — Telephone Encounter (Signed)
Patient requesting a refill on Plaquenil sent to [facility].

## 2020-07-14 NOTE — Telephone Encounter (Signed)
Last Visit: 07/03/2020 Next Visit: 11/30/2020 Labs: 07/01/2019, B-12 1,226,  Eye exam: 03/23/2020  Current Dose per office note 07/03/2020, PLQ 200 mg BID M-F DX: Autoimmune disease   Last Fill: 04/17/2020  Okay to refill Plaquenil?

## 2020-07-24 ENCOUNTER — Other Ambulatory Visit (HOSPITAL_COMMUNITY): Payer: Self-pay | Admitting: Psychology

## 2020-07-25 ENCOUNTER — Encounter: Payer: Self-pay | Admitting: Rheumatology

## 2020-08-04 ENCOUNTER — Other Ambulatory Visit (HOSPITAL_BASED_OUTPATIENT_CLINIC_OR_DEPARTMENT_OTHER): Payer: Self-pay

## 2020-08-09 ENCOUNTER — Other Ambulatory Visit (HOSPITAL_COMMUNITY): Payer: Self-pay | Admitting: Student

## 2020-08-09 MED FILL — OXYCODONE-APAP 5-325MG: 5-325 | 7 days supply | Qty: 30 | Fill #0

## 2020-08-09 MED FILL — METHOCARBAMOL 500 MG TABS: 500 | 11 days supply | Qty: 45 | Fill #0

## 2020-08-22 ENCOUNTER — Other Ambulatory Visit (HOSPITAL_COMMUNITY): Payer: Self-pay

## 2020-08-22 MED FILL — Esomeprazole Magnesium Cap Delayed Release 40 MG (Base Eq): ORAL | 30 days supply | Qty: 30 | Fill #0 | Status: AC

## 2020-08-23 ENCOUNTER — Other Ambulatory Visit (HOSPITAL_COMMUNITY): Payer: Self-pay

## 2020-08-23 MED ORDER — VYVANSE 40 MG PO CAPS
40.0000 mg | ORAL_CAPSULE | Freq: Every morning | ORAL | 0 refills | Status: DC
  Filled 2020-08-23: qty 30, 30d supply, fill #0

## 2020-08-25 ENCOUNTER — Other Ambulatory Visit (HOSPITAL_COMMUNITY): Payer: Self-pay

## 2020-08-29 IMAGING — MR MR HEAD WO/W CM
12 series · 48 of 48 positions shown · IV contrast (multihance)
Comparison: None available.

CLINICAL DATA: Initial evaluation for worsening headaches with
cognitive changes, hyper reflexia, brain fog, migraines.

EXAM:
MRI HEAD WITHOUT AND WITH CONTRAST
MRI CERVICAL SPINE WITHOUT AND WITH CONTRAST
TECHNIQUE: Multiplanar, multiecho pulse sequences of the brain and surrounding
structures, and cervical spine, to include the craniocervical
junction and cervicothoracic junction, were obtained without and
with intravenous contrast.
CONTRAST:  14mL MULTIHANCE GADOBENATE DIMEGLUMINE 529 MG/ML IV SOLN

[Series 6: T1 · sagittal · 4.0mm · 0.75mm/px · 1 of 29 slices shown (1 of 3)]
[im 1/29]
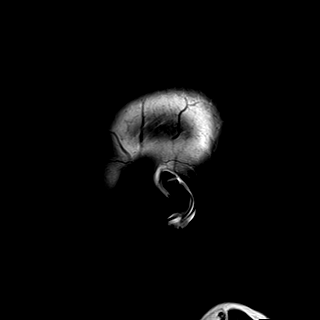

[Series 7: DWI · axial · 3.0mm · 1.44mm/px · z∈[-46,+99]mm · 4 of 90 slices shown (1 of 4)]
[im 1/90]
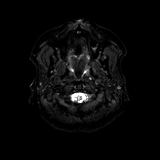
[im 30/90]
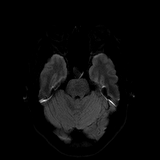
[im 60/90]
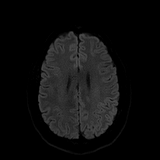
[im 90/90]
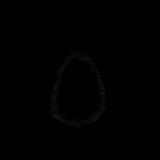

[Series 8: DWI · axial · 3.0mm · 1.44mm/px · z∈[-46,+99]mm · 3 of 44 slices shown (2 of 4)]
[im 1/44]
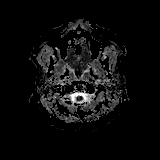
[im 22/44]
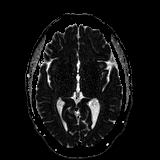
[im 44/44]
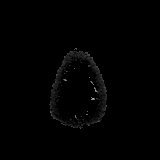

[Series 9: DWI · coronal · 5.0mm · 1.44mm/px · 4 of 66 slices shown (3 of 4)]
[im 1/66]
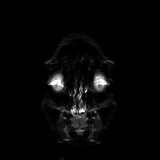
[im 22/66]
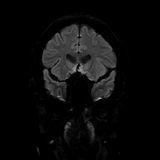
[im 44/66]
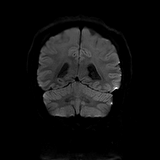
[im 66/66]
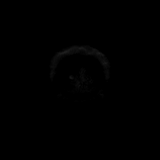

[Series 10: DWI · coronal · 5.0mm · 1.44mm/px · 2 of 33 slices shown (4 of 4)]
[im 1/33]
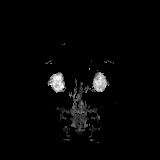
[im 33/33]
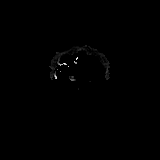

[Series 11: T2 · axial · 4.0mm · 0.36mm/px · z∈[-40,+105]mm · 2 of 29 slices shown (1 of 2)]
[im 1/29]
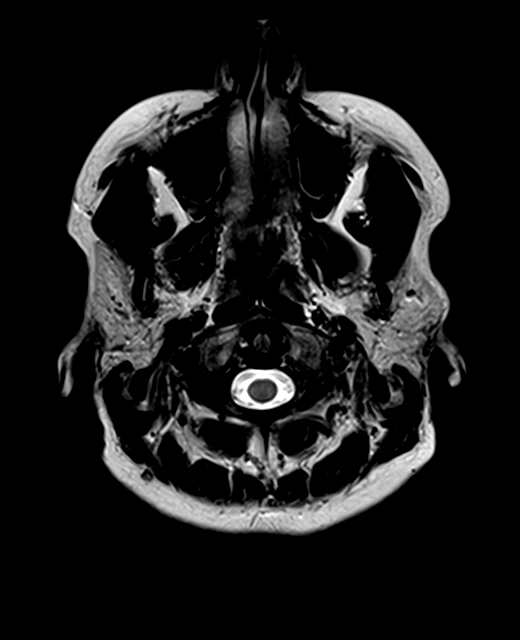
[im 29/29]
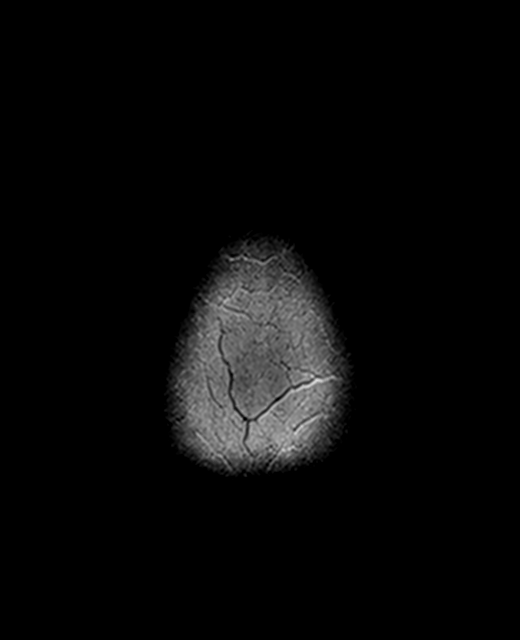

[Series 12: FLAIR · axial · 3.0mm · 0.72mm/px · z∈[-41,+109]mm · 2 of 26 slices shown]
[im 1/26]
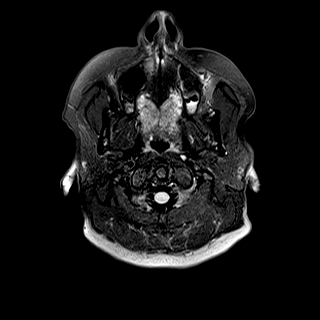
[im 26/26]
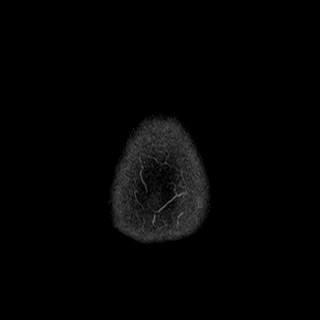

[Series 14: swi_images · axial · 1.5mm · 0.90mm/px · z∈[-39,+104]mm · 6 of 96 slices shown]
[im 1/96]
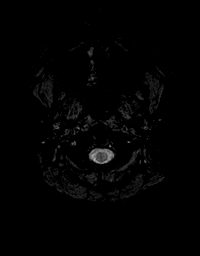
[im 20/96]
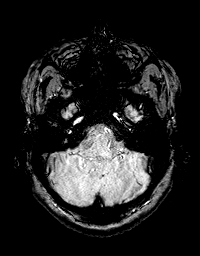
[im 39/96]
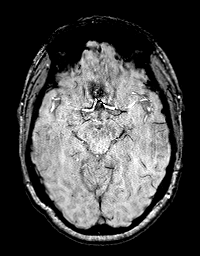
[im 58/96]
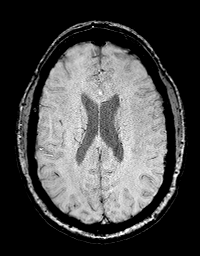
[im 77/96]
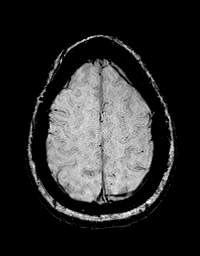
[im 96/96]
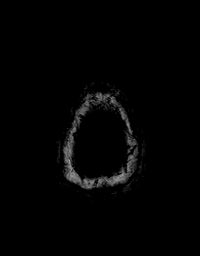

[Series 15: T1 · axial · 1.0mm · 0.94mm/px · z∈[-47,+112]mm · 10 of 160 slices shown (2 of 3)]
[im 1/160]
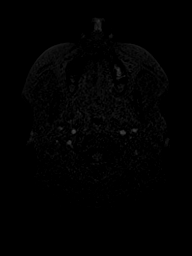
[im 18/160]
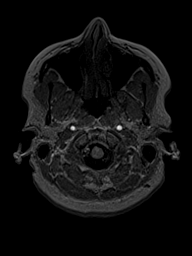
[im 36/160]
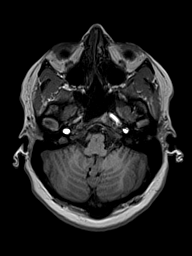
[im 54/160]
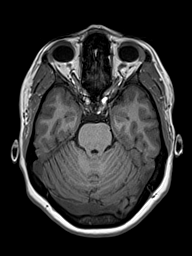
[im 71/160]
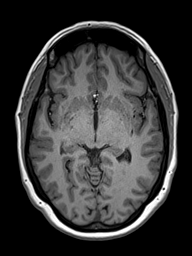
[im 89/160]
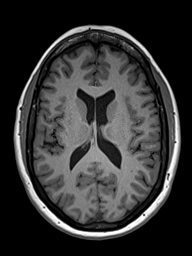
[im 107/160]
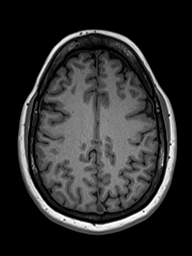
[im 124/160]
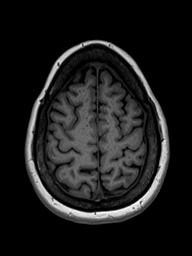
[im 142/160]
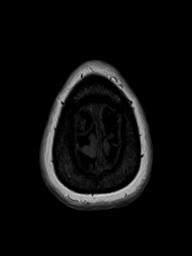
[im 160/160]
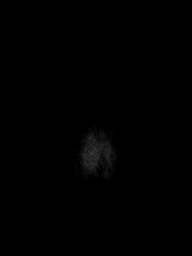

[Series 16: T2 · coronal · 4.0mm · 0.36mm/px · 2 of 35 slices shown (2 of 2)]
[im 1/35]
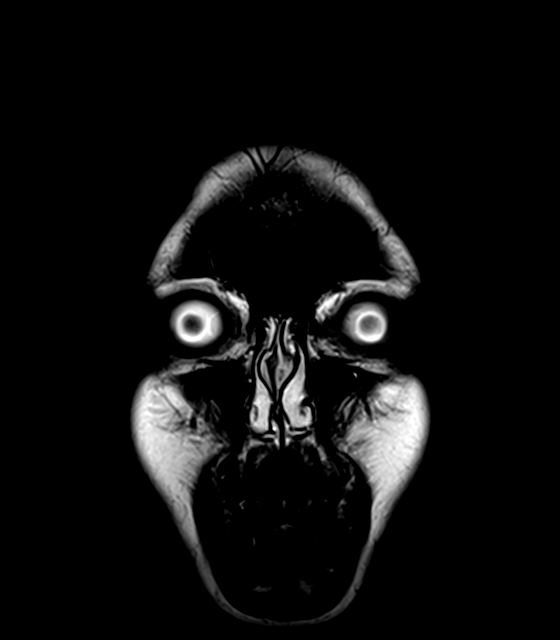
[im 35/35]
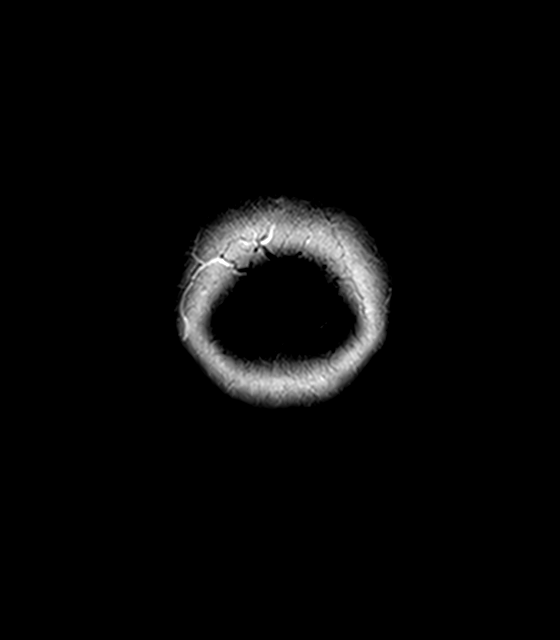

[Series 17: T1 · axial · 1.0mm · 0.94mm/px · z∈[-47,+112]mm · 10 of 160 slices shown (3 of 3)]
[im 1/160]
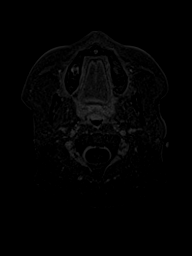
[im 18/160]
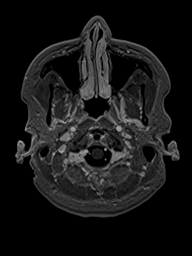
[im 36/160]
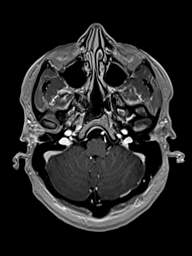
[im 54/160]
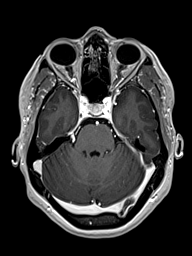
[im 71/160]
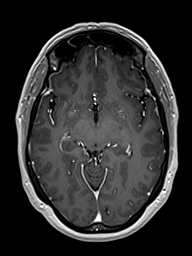
[im 89/160]
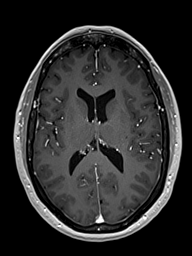
[im 107/160]
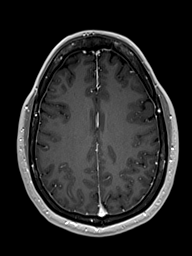
[im 124/160]
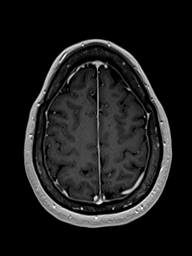
[im 142/160]
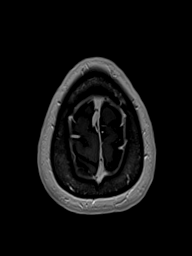
[im 160/160]
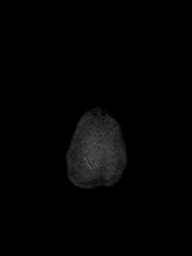

[Series 18: T1 post-contrast · coronal · 4.0mm · 0.72mm/px · 2 of 35 slices shown]
[im 1/35]
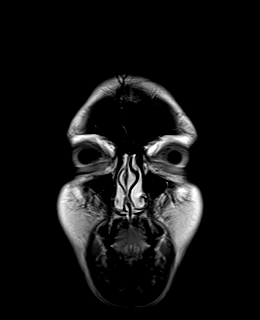
[im 35/35]
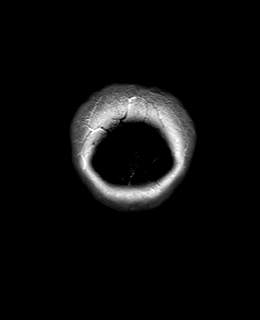

[48 of 48 positions shown; findings below may reference images not displayed]

FINDINGS: MRI HEAD FINDINGS

Brain: Cerebral volume within normal limits for patient age. No
focal parenchymal signal abnormality identified. Subcentimeter
dilated perivascular space noted adjacent to the frontal horn of the
left lateral ventricle.

No abnormal foci of restricted diffusion to suggest acute or
subacute ischemia. Gray-white matter differentiation well
maintained. No encephalomalacia to suggest chronic infarction. No
foci of susceptibility artifact to suggest acute or chronic
intracranial hemorrhage.

No mass lesion, midline shift or mass effect. No hydrocephalus. No
extra-axial fluid collection. Major dural sinuses are grossly
patent.

Pituitary gland and suprasellar region are normal. Midline
structures intact and normal.

No abnormal enhancement.

Vascular: Major intracranial vascular flow voids well maintained and
normal in appearance.

Skull and upper cervical spine: Craniocervical junction normal.
Visualized upper cervical spine within normal limits. Bone marrow
signal intensity normal. No scalp soft tissue abnormality.

Sinuses/Orbits: Globes and orbital soft tissues within normal
limits.

Paranasal sinuses are clear. Trace left mastoid effusion noted, of
doubtful significance. Inner ear structures normal.

Other: None.

MRI CERVICAL SPINE FINDINGS

Alignment: Straightening of the normal cervical lordosis with
underlying dextroscoliosis. Trace 2 mm retrolisthesis of C4 on C5,
chronic and degenerative.

Vertebrae: Vertebral body height maintained without evidence for
acute or chronic fracture. Bone marrow signal intensity within
normal limits. Subcentimeter benign hemangioma noted within the T1
vertebral body. No worrisome osseous lesions. Discogenic reactive
endplate changes noted about the C4-5 and C5-6 interspaces. No other
abnormal marrow edema or enhancement.

Cord: Signal intensity within the cervical spinal cord is normal. No
abnormal enhancement.

Posterior Fossa, vertebral arteries, paraspinal tissues:
Craniocervical junction within normal limits. Paraspinous and
prevertebral soft tissues are normal. Normal flow voids seen within
the vertebral arteries bilaterally. Strongly dominant right
vertebral artery noted.

Disc levels:

C2-C3: Unremarkable.

C3-C4:  Mild diffuse disc bulge.  No canal or foraminal stenosis.

C4-C5: Chronic intervertebral disc space narrowing with diffuse
degenerative disc osteophyte, eccentric to the left. Broad posterior
component flattens and partially effaces the ventral thecal sac and
resultant moderate spinal stenosis. Mild cord flattening without
cord signal changes. Severe left with moderate right C5 foraminal
stenosis.

C5-C6: Chronic intervertebral disc space narrowing with diffuse
degenerative disc osteophyte. Broad posterior component flattens and
effaces the ventral thecal sac resultant moderate spinal stenosis.
Mild cord flattening without cord signal changes. Severe right with
moderate left C6 foraminal stenosis.

C6-C7: Mild disc bulge with superimposed small central disc
protrusion indents the ventral thecal sac. No significant spinal
stenosis or cord deformity. Foramina remain patent.

C7-T1:  Normal interspace.  Mild facet hypertrophy.  No stenosis.

Visualized upper thoracic spine demonstrates mild disc bulging at
T2-3 without significant stenosis.
IMPRESSION: MRI HEAD IMPRESSION:

Normal brain MRI for age. No findings to explain patient's symptoms
identified.

MRI CERVICAL SPINE IMPRESSION:

1. Normal MRI appearance of the cervical spinal cord. No cord signal
changes to suggest myelopathy or other abnormality.
2. Degenerative disc osteophyte complexes at C4-5 and C5-6 with
resultant moderate spinal stenosis and mild cord flattening.
Associated severe left with moderate right C5 foraminal stenosis,
with severe right and moderate left C6 foraminal narrowing.

## 2020-08-29 IMAGING — MR MR CERVICAL SPINE WO/W CM
5 of 8 series · 25 of 48 positions shown · IV contrast (multihance)
Comparison: None available.

CLINICAL DATA: Initial evaluation for worsening headaches with
cognitive changes, hyper reflexia, brain fog, migraines.

EXAM:
MRI HEAD WITHOUT AND WITH CONTRAST
MRI CERVICAL SPINE WITHOUT AND WITH CONTRAST
TECHNIQUE: Multiplanar, multiecho pulse sequences of the brain and surrounding
structures, and cervical spine, to include the craniocervical
junction and cervicothoracic junction, were obtained without and
with intravenous contrast.
CONTRAST:  14mL MULTIHANCE GADOBENATE DIMEGLUMINE 529 MG/ML IV SOLN

[Series 10: T1 · sagittal · 3.0mm · 0.66mm/px · 3 of 17 slices shown (1 of 3)]
[im 1/17]
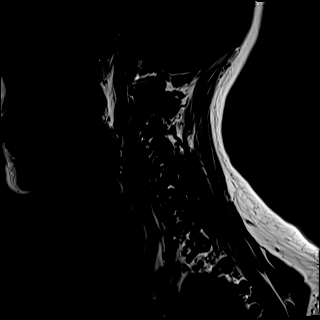
[im 9/17]
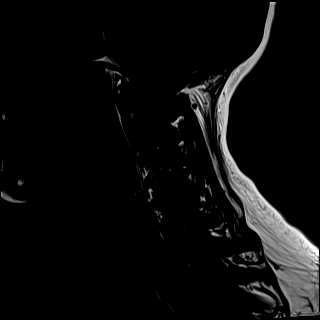
[im 17/17]
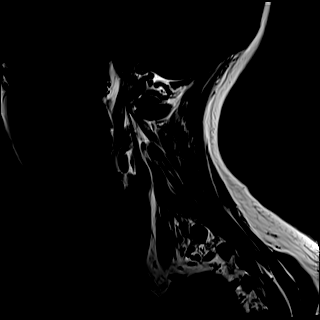

[Series 12: T2 · axial · 3.0mm · 0.50mm/px · z∈[-219,-99]mm · 9 of 42 slices shown (1 of 2)]
[im 1/42]
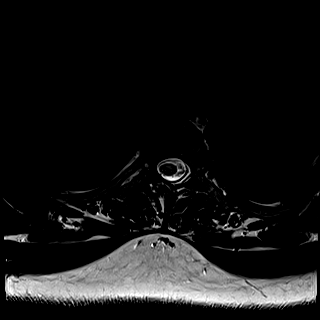
[im 6/42]
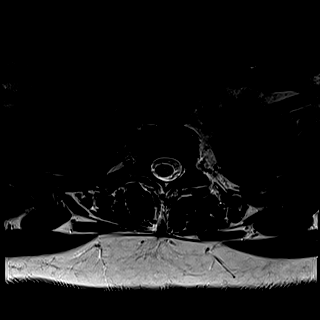
[im 11/42]
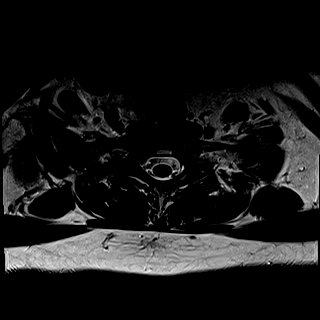
[im 16/42]
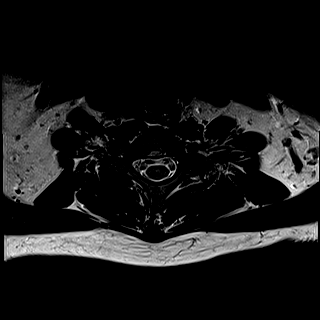
[im 21/42]
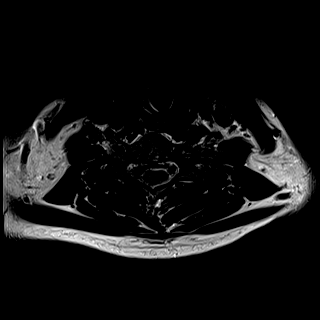
[im 26/42]
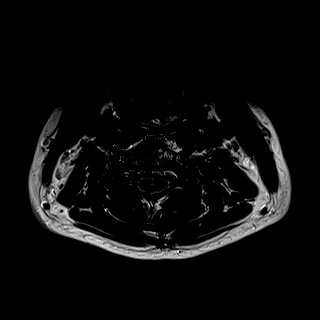
[im 31/42]
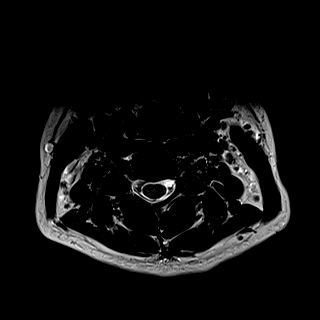
[im 36/42]
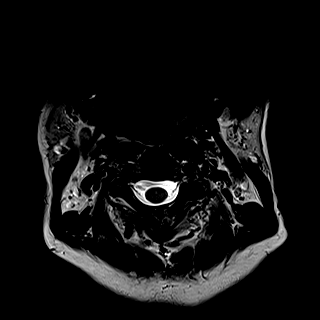
[im 42/42]
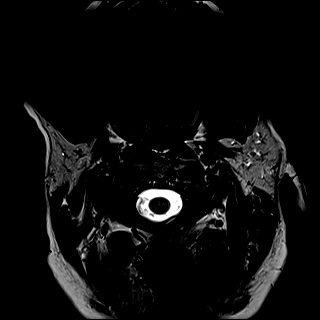

[Series 14: T1 · axial · non-contrast · 3.0mm · 0.31mm/px · z∈[-219,-99]mm · 9 of 42 slices shown (2 of 3)]
[im 1/42]
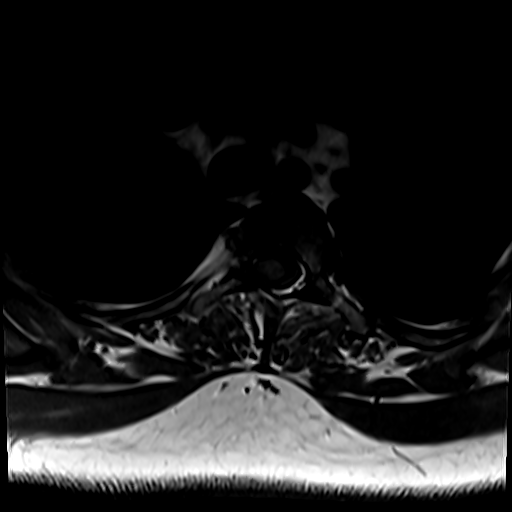
[im 6/42]
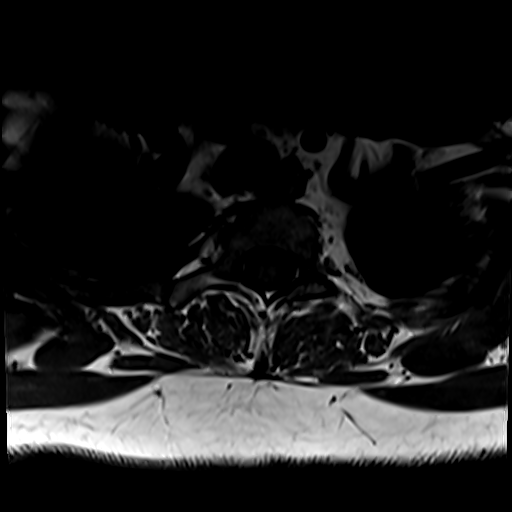
[im 11/42]
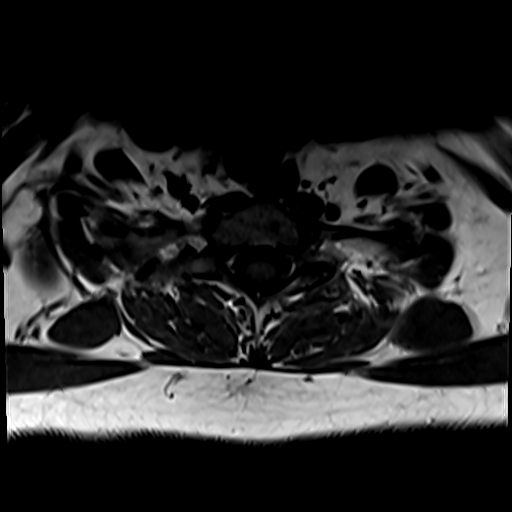
[im 16/42]
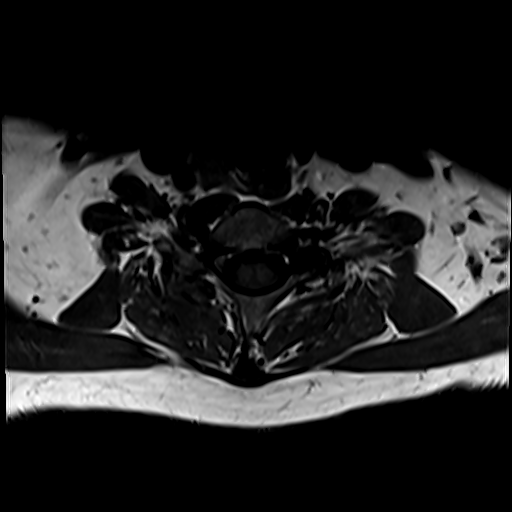
[im 21/42]
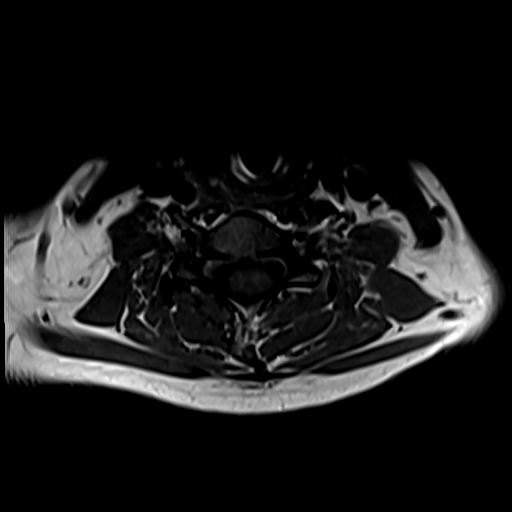
[im 26/42]
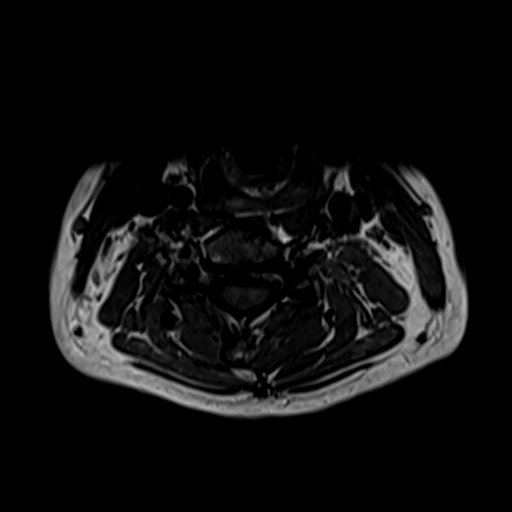
[im 31/42]
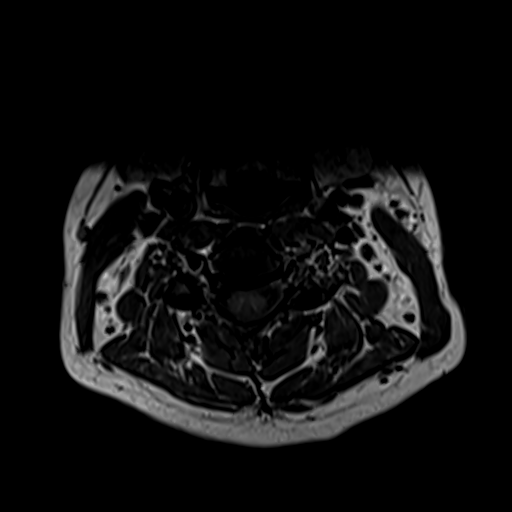
[im 36/42]
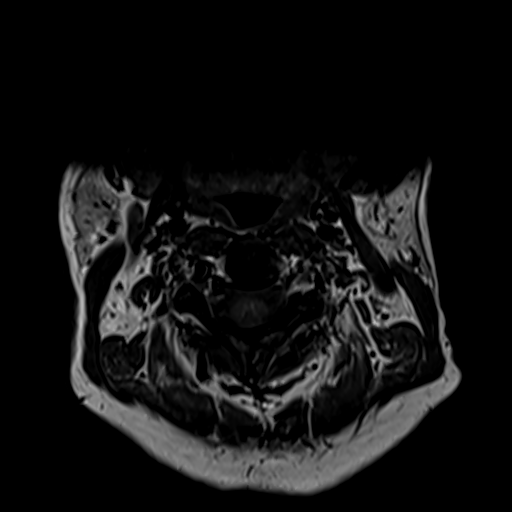
[im 42/42]
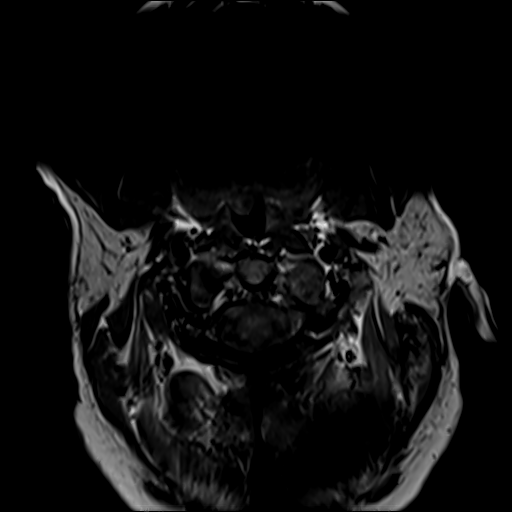

[Series 15: T2 · sagittal · 3.0mm · 0.55mm/px · 3 of 17 slices shown (2 of 2)]
[im 1/17]
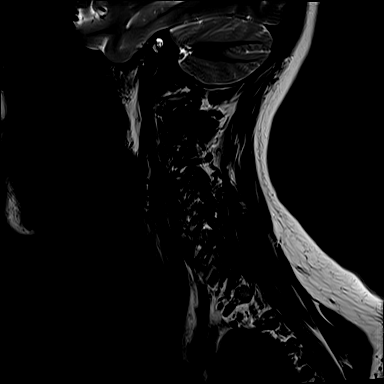
[im 9/17]
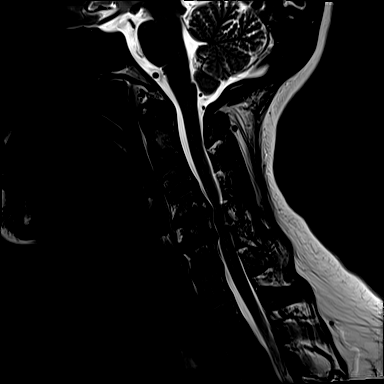
[im 17/17]
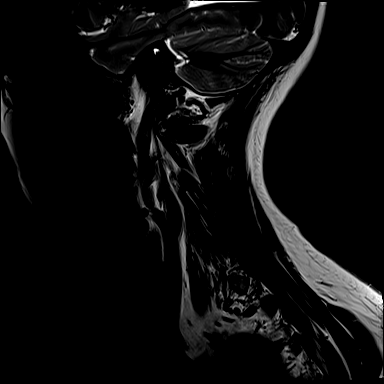

[Series 17: T1 · axial · 3.0mm · 0.31mm/px · 1 of 42 slices shown (3 of 3)]
[im 1/42]
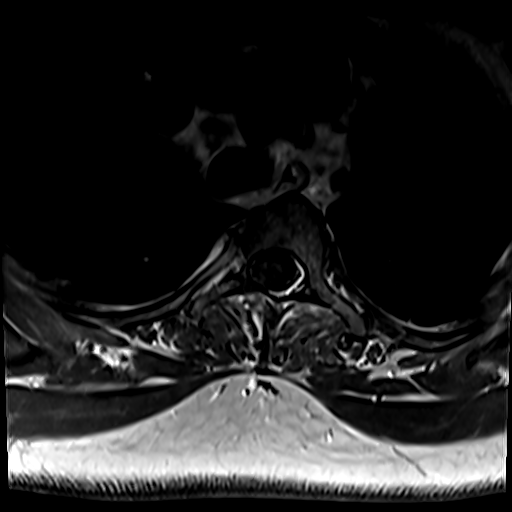

[25 of 48 positions shown; findings below may reference images not displayed]

FINDINGS: MRI HEAD FINDINGS

Brain: Cerebral volume within normal limits for patient age. No
focal parenchymal signal abnormality identified. Subcentimeter
dilated perivascular space noted adjacent to the frontal horn of the
left lateral ventricle.

No abnormal foci of restricted diffusion to suggest acute or
subacute ischemia. Gray-white matter differentiation well
maintained. No encephalomalacia to suggest chronic infarction. No
foci of susceptibility artifact to suggest acute or chronic
intracranial hemorrhage.

No mass lesion, midline shift or mass effect. No hydrocephalus. No
extra-axial fluid collection. Major dural sinuses are grossly
patent.

Pituitary gland and suprasellar region are normal. Midline
structures intact and normal.

No abnormal enhancement.

Vascular: Major intracranial vascular flow voids well maintained and
normal in appearance.

Skull and upper cervical spine: Craniocervical junction normal.
Visualized upper cervical spine within normal limits. Bone marrow
signal intensity normal. No scalp soft tissue abnormality.

Sinuses/Orbits: Globes and orbital soft tissues within normal
limits.

Paranasal sinuses are clear. Trace left mastoid effusion noted, of
doubtful significance. Inner ear structures normal.

Other: None.

MRI CERVICAL SPINE FINDINGS

Alignment: Straightening of the normal cervical lordosis with
underlying dextroscoliosis. Trace 2 mm retrolisthesis of C4 on C5,
chronic and degenerative.

Vertebrae: Vertebral body height maintained without evidence for
acute or chronic fracture. Bone marrow signal intensity within
normal limits. Subcentimeter benign hemangioma noted within the T1
vertebral body. No worrisome osseous lesions. Discogenic reactive
endplate changes noted about the C4-5 and C5-6 interspaces. No other
abnormal marrow edema or enhancement.

Cord: Signal intensity within the cervical spinal cord is normal. No
abnormal enhancement.

Posterior Fossa, vertebral arteries, paraspinal tissues:
Craniocervical junction within normal limits. Paraspinous and
prevertebral soft tissues are normal. Normal flow voids seen within
the vertebral arteries bilaterally. Strongly dominant right
vertebral artery noted.

Disc levels:

C2-C3: Unremarkable.

C3-C4:  Mild diffuse disc bulge.  No canal or foraminal stenosis.

C4-C5: Chronic intervertebral disc space narrowing with diffuse
degenerative disc osteophyte, eccentric to the left. Broad posterior
component flattens and partially effaces the ventral thecal sac and
resultant moderate spinal stenosis. Mild cord flattening without
cord signal changes. Severe left with moderate right C5 foraminal
stenosis.

C5-C6: Chronic intervertebral disc space narrowing with diffuse
degenerative disc osteophyte. Broad posterior component flattens and
effaces the ventral thecal sac resultant moderate spinal stenosis.
Mild cord flattening without cord signal changes. Severe right with
moderate left C6 foraminal stenosis.

C6-C7: Mild disc bulge with superimposed small central disc
protrusion indents the ventral thecal sac. No significant spinal
stenosis or cord deformity. Foramina remain patent.

C7-T1:  Normal interspace.  Mild facet hypertrophy.  No stenosis.

Visualized upper thoracic spine demonstrates mild disc bulging at
T2-3 without significant stenosis.
IMPRESSION: MRI HEAD IMPRESSION:

Normal brain MRI for age. No findings to explain patient's symptoms
identified.

MRI CERVICAL SPINE IMPRESSION:

1. Normal MRI appearance of the cervical spinal cord. No cord signal
changes to suggest myelopathy or other abnormality.
2. Degenerative disc osteophyte complexes at C4-5 and C5-6 with
resultant moderate spinal stenosis and mild cord flattening.
Associated severe left with moderate right C5 foraminal stenosis,
with severe right and moderate left C6 foraminal narrowing.

## 2020-09-06 ENCOUNTER — Other Ambulatory Visit (HOSPITAL_COMMUNITY): Payer: Self-pay

## 2020-09-06 MED FILL — Duloxetine HCl Enteric Coated Pellets Cap 30 MG (Base Eq): ORAL | 90 days supply | Qty: 180 | Fill #0 | Status: AC

## 2020-09-07 ENCOUNTER — Other Ambulatory Visit (HOSPITAL_COMMUNITY): Payer: Self-pay

## 2020-09-19 ENCOUNTER — Other Ambulatory Visit (HOSPITAL_COMMUNITY): Payer: Self-pay

## 2020-09-21 ENCOUNTER — Other Ambulatory Visit (HOSPITAL_COMMUNITY): Payer: Self-pay

## 2020-09-21 MED ORDER — VYVANSE 40 MG PO CAPS: ORAL_CAPSULE | ORAL | 0 refills | Status: DC

## 2020-09-21 MED ORDER — DULOXETINE HCL 30 MG PO CPEP
30.0000 mg | ORAL_CAPSULE | Freq: Two times a day (BID) | ORAL | 1 refills | Status: DC
  Filled 2020-09-21 – 2020-12-09 (×2): qty 180, 90d supply, fill #0
  Filled 2021-02-19: qty 180, 90d supply, fill #1

## 2020-09-21 MED ORDER — VYVANSE 40 MG PO CAPS
ORAL_CAPSULE | ORAL | 0 refills | Status: DC
  Filled 2020-09-21: qty 30, 30d supply, fill #0

## 2020-10-01 ENCOUNTER — Other Ambulatory Visit: Payer: Self-pay | Admitting: Physician Assistant

## 2020-10-01 ENCOUNTER — Other Ambulatory Visit: Payer: Self-pay | Admitting: Family Medicine

## 2020-10-02 ENCOUNTER — Other Ambulatory Visit (HOSPITAL_COMMUNITY): Payer: Self-pay

## 2020-10-02 MED ORDER — ESOMEPRAZOLE MAGNESIUM 40 MG PO CPDR
DELAYED_RELEASE_CAPSULE | Freq: Every day | ORAL | 3 refills | Status: DC
  Filled 2020-10-02: qty 30, 30d supply, fill #0
  Filled 2020-11-29: qty 30, 30d supply, fill #1
  Filled 2021-01-04: qty 30, 30d supply, fill #2
  Filled 2021-02-15: qty 30, 30d supply, fill #3

## 2020-10-02 MED ORDER — HYDROXYCHLOROQUINE SULFATE 200 MG PO TABS
ORAL_TABLET | ORAL | 0 refills | Status: DC
  Filled 2020-10-02: qty 120, 84d supply, fill #0

## 2020-10-02 NOTE — Telephone Encounter (Signed)
Last Visit: 07/03/2020 Next Visit: 11/30/2020 Labs: 06/30/2020, WNL Eye exam: 03/23/2020  Current Dose per office note 07/03/2020, PLQ 200 mg BID M-F  DX: Autoimmune disease   Last Fill: 07/14/2020  Okay to refill Plaquenil?

## 2020-10-04 ENCOUNTER — Other Ambulatory Visit (HOSPITAL_COMMUNITY): Payer: Self-pay

## 2020-10-19 ENCOUNTER — Encounter: Payer: Self-pay | Admitting: Family Medicine

## 2020-11-16 NOTE — Progress Notes (Signed)
Office Visit Note  Patient: Lindsay Wong             Date of Birth: 11-21-1980           MRN: [id number]             PCP: Wynn Banker, MD Referring: Wynn Banker, MD Visit Date: 11/30/2020 Occupation: @GUAROCC @  Subjective:  Rash.   History of Present Illness: Lindsay Wong is a 40 y.o. female with a history of autoimmune disease and osteoarthritis.  She continues to have some fatigue and joint discomfort.  She states about 3 months ago she had an episode of increased elbow pain, rash on her elbows and fatigue which resolved.  She used some topical agents.  She has off-and-on discomfort in her right knee joint especially when she climbs stairs.  She was experiencing left-sided radiculopathy and neck pain and underwent C-spine fusion in March 2022 by Dr. April 2022.  Activities of Daily Living:  Patient reports morning stiffness for 0 minutes.   Patient Denies nocturnal pain.  Difficulty dressing/grooming: Denies Difficulty climbing stairs: Denies Difficulty getting out of chair: Denies Difficulty using hands for taps, buttons, cutlery, and/or writing: Denies  Review of Systems  Constitutional:  Negative for fatigue.  HENT:  Negative for mouth sores, mouth dryness and nose dryness.   Eyes:  Negative for pain, itching and dryness.  Respiratory:  Negative for shortness of breath and difficulty breathing.   Cardiovascular:  Negative for chest pain and palpitations.  Gastrointestinal:  Negative for blood in stool, constipation and diarrhea.  Endocrine: Negative for increased urination.  Genitourinary:  Negative for difficulty urinating.  Musculoskeletal:  Positive for joint pain, joint pain, myalgias and myalgias. Negative for joint swelling, morning stiffness and muscle tenderness.  Skin:  Positive for color change and rash.  Allergic/Immunologic: Negative for susceptible to infections.  Neurological:  Positive for numbness. Negative for dizziness, headaches, memory loss  and weakness.  Hematological:  Negative for bruising/bleeding tendency.  Psychiatric/Behavioral:  Negative for confusion.    PMFS History:  Patient Active Problem List   Diagnosis Date Noted   Foraminal stenosis of cervical region 07/04/2020   Adverse effect of other viral vaccines, subsequent encounter 01/27/2020   Spinal stenosis of cervical region 12/28/2019   Fatigue 08/20/2019   Anxiety and depression 05/19/2017   Left shoulder pain 07/19/2014   RAYNAUD'S DISEASE 01/31/2007    Past Medical History:  Diagnosis Date   Allergy    Anxiety and depression    per patient diagnosed by psychiatrist   Appendicitis 07/20/2012   Blood in stool    Chicken pox    Depression    postpartum with one child   Fatty liver    GERD 01/31/2007   Qualifier: Diagnosis of  By: 02/02/2007 RMA, [name]     Hyperlipidemia    postpartum   PONV (postoperative nausea and vomiting)    Positive ANA (antinuclear antibody)    RAYNAUD'S DISEASE 01/31/2007   Qualifier: Diagnosis of  By: 02/02/2007 RMA, [name]     Urticaria     Family History  Problem Relation Age of Onset   Early menopause Mother    Other Father        no contact since infant   Raynaud syndrome Sister    Cancer Maternal Grandmother        melanoma   Stroke Maternal Grandmother 36   Arthritis Maternal Grandmother    Hyperlipidemia Maternal Grandmother    Hypertension Maternal Grandmother  Breast cancer Maternal Grandmother 72   Cancer Maternal Grandfather        bladder   Bladder Cancer Maternal Grandfather    Lung cancer Other        all but one of grandmother's siblings    Lupus Maternal Uncle    Leukemia Maternal Uncle    Multiple sclerosis Cousin    Autism Son    Healthy Son    Healthy Son    Healthy Son    Immunodeficiency Maternal Aunt    Heart attack Maternal Aunt 51   CAD Maternal Aunt    Colon cancer Neg Hx    Esophageal cancer Neg Hx    Stomach cancer Neg Hx    Rectal cancer Neg Hx    Past Surgical History:  Procedure  Laterality Date   ANTERIOR FUSION CERVICAL SPINE  07/2020   CESAREAN SECTION     x3   COLONOSCOPY  01/2020   LAPAROSCOPIC APPENDECTOMY Right 07/20/2012   Procedure: APPENDECTOMY LAPAROSCOPIC;  Surgeon: Velora Heckler, MD;  Location: [facility];  Service: General;  Laterality: Right;   WISDOM TOOTH EXTRACTION     Social History   Social History Narrative   Work or School: homemaker, 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,7 and 3 (2014)      Home Situation: lives with husband and children      Spiritual Beliefs: none      Lifestyle: hot yoga, weights and cardio, tries to eat a healthy diet      Lives in a 2 story home      Right handed         Immunization History  Administered Date(s) Administered   Influenza Split 01/04/2011   Influenza Whole 04/07/2006, 02/01/2008   Influenza,inj,Quad PF,6+ Mos 01/14/2014, 02/08/2015, 02/20/2016, 02/12/2017, 02/13/2018, 03/01/2019   Influenza-Unspecified 01/11/2013, 02/11/2016, 03/13/2020   Janssen (J&J) SARS-COV-2 Vaccination 01/28/2020, 04/29/2020   Tdap 03/01/2019     Objective: Vital Signs: BP 138/88 (BP Location: Left Arm, Patient Position: Sitting, Cuff Size: Normal)   Pulse 98   Ht 5\' 3"  (1.6 m)   Wt 161 lb 6.4 oz (73.2 kg)   LMP 11/15/2020   BMI 28.59 kg/m    Physical Exam Vitals and nursing note reviewed.  Constitutional:      Appearance: She is well-developed.  HENT:     Head: Normocephalic and atraumatic.  Eyes:     Conjunctiva/sclera: Conjunctivae normal.  Cardiovascular:     Rate and Rhythm: Normal rate and regular rhythm.     Heart sounds: Normal heart sounds.  Pulmonary:     Effort: Pulmonary effort is normal.     Breath sounds: Normal breath sounds.  Abdominal:     General: Bowel sounds are normal.     Palpations: Abdomen is soft.  Musculoskeletal:     Cervical back: Normal range of motion.  Lymphadenopathy:     Cervical: No cervical adenopathy.  Skin:    General: Skin is warm and dry.     Capillary Refill: Capillary refill takes  less than 2 seconds.     Comments: Erythematous papular lesions were noted on bilateral upper extremities.  Neurological:     Mental Status: She is alert and oriented to person, place, and time.  Psychiatric:        Behavior: Behavior normal.     Musculoskeletal Exam: C-spine was in good range of motion.  Shoulder joints, elbow joints, wrist joints, MCPs PIPs and DIPs with good range of motion with no synovitis.  Hip joints, knee joints, ankles, MTPs and PIPs with good range of motion with no synovitis.  CDAI Exam: CDAI Score: -- Patient Global: --; Provider Global: -- Swollen: --; Tender: -- Joint Exam 11/30/2020   No joint exam has been documented for this visit   There is currently no information documented on the homunculus. Go to the Rheumatology activity and complete the homunculus joint exam.  Investigation: No additional findings.  Imaging: No results found.  Recent Labs: Lab Results  Component Value Date   WBC 8.2 11/20/2020   HGB 12.9 11/20/2020   PLT 413 (H) 11/20/2020   NA 138 11/20/2020   K 4.2 11/20/2020   CL 101 11/20/2020   CO2 31 11/20/2020   GLUCOSE 65 11/20/2020   BUN 9 11/20/2020   CREATININE 0.79 11/20/2020   BILITOT 0.4 11/20/2020   ALKPHOS 76 06/30/2020   AST 18 11/20/2020   ALT 26 11/20/2020   PROT 7.6 11/20/2020   ALBUMIN 4.3 06/30/2020   CALCIUM 10.1 11/20/2020   GFRAA 119 06/19/2020    Speciality Comments: PLQ Eye Exam: 03/23/2020 WNL @ [facility] Follow up in 1 year  Procedures:  No procedures performed Allergies: Clarithromycin, Peg-2000 (covid-19 mrna vaccine component), and Latex   Assessment / Plan:     Visit Diagnoses: Autoimmune disease (HCC) - ANA 1: 640 speckled pattern, ENA negative, C3-C4 normal, anticardiolipin, beta-2, LA-,  History of fatigue,oral ulcers, arthralgias, photosensitivity, sicca sym -she had an episode of increased fatigue, elbow pain and rash about 3 months ago which resolved.  She still has  some residual papular lesions on her upper extremities.  She had elbow discomfort during that episode but then the discomfort resolved.  She denies any history of oral ulcers, malar rash, photosensitivity.  She Raynauds is not very active currently.  Plan: Urinalysis, Routine w reflex microscopic, Anti-DNA antibody, double-stranded, C3 and C4, Sedimentation rate  High risk medication use - PLQ 200 mg BID M-F. PLQ Eye Exam: 03/23/2020  - Plan: CBC with Differential/Platelet, COMPLETE METABOLIC PANEL WITH GFR  Raynaud's syndrome without gangrene-currently not active.  Primary osteoarthritis of both knees - Moderate OA.  She has some discomfort in the right knee joint when she climbs the stairs.  She had no synovitis on my examination.  DDD (degenerative disc disease), cervical - S/P fusion 03/22 by Dr.Cram .Doing well.  She noticed improvement in her left-sided radiculopathy and left arm weakness.  Rash-she has papular lesions on her bilateral upper extremities.  I advised her to schedule an appointment with a dermatologist for evaluation.  Other fatigue-improved.  Elevated blood pressure reading-mildly elevated reading today.  History of esophageal reflux  History of migraine  Pre-eclampsia in third trimester  Anxiety and depression  Family history of multiple sclerosis  Orders: Orders Placed This Encounter  Procedures   CBC with Differential/Platelet   COMPLETE METABOLIC PANEL WITH GFR   Urinalysis, Routine w reflex microscopic   Anti-DNA antibody, double-stranded   C3 and C4   Sedimentation rate    No orders of the defined types were placed in this encounter.    Follow-Up Instructions: Return in about 5 months (around 05/02/2021) for Autoimmune disease.   Pollyann Savoy, MD  Note - This record has been created using Animal nutritionist.  Chart creation errors have been sought, but may not always  have been located. Such creation errors do not reflect on  the standard  of medical care.

## 2020-11-20 ENCOUNTER — Other Ambulatory Visit: Payer: Self-pay | Admitting: *Deleted

## 2020-11-20 DIAGNOSIS — Z79899 Other long term (current) drug therapy: Secondary | ICD-10-CM

## 2020-11-20 DIAGNOSIS — M359 Systemic involvement of connective tissue, unspecified: Secondary | ICD-10-CM

## 2020-11-23 LAB — MICROSCOPIC MESSAGE

## 2020-11-23 LAB — COMPLETE METABOLIC PANEL WITH GFR
AG Ratio: 1.5 (calc) (ref 1.0–2.5)
ALT: 26 U/L (ref 6–29)
AST: 18 U/L (ref 10–30)
Albumin: 4.5 g/dL (ref 3.6–5.1)
Alkaline phosphatase (APISO): 101 U/L (ref 31–125)
BUN: 9 mg/dL (ref 7–25)
CO2: 31 mmol/L (ref 20–32)
Calcium: 10.1 mg/dL (ref 8.6–10.2)
Chloride: 101 mmol/L (ref 98–110)
Creat: 0.79 mg/dL (ref 0.50–0.99)
Globulin: 3.1 g/dL (calc) (ref 1.9–3.7)
Glucose, Bld: 65 mg/dL (ref 65–99)
Potassium: 4.2 mmol/L (ref 3.5–5.3)
Sodium: 138 mmol/L (ref 135–146)
Total Bilirubin: 0.4 mg/dL (ref 0.2–1.2)
Total Protein: 7.6 g/dL (ref 6.1–8.1)
eGFR: 96 mL/min/{1.73_m2} (ref >=60)

## 2020-11-23 LAB — CBC WITH DIFFERENTIAL/PLATELET
Absolute Monocytes: 541 cells/uL (ref 200–950)
Basophils Absolute: 57 cells/uL (ref 0–200)
Basophils Relative: 0.7 %
Eosinophils Absolute: 230 cells/uL (ref 15–500)
Eosinophils Relative: 2.8 %
HCT: 39.2 % (ref 35.0–45.0)
Hemoglobin: 12.9 g/dL (ref 11.7–15.5)
Lymphs Abs: 2886 cells/uL (ref 850–3900)
MCH: 27.4 pg (ref 27.0–33.0)
MCHC: 32.9 g/dL (ref 32.0–36.0)
MCV: 83.4 fL (ref 80.0–100.0)
MPV: 10.9 fL (ref 7.5–12.5)
Monocytes Relative: 6.6 %
Neutro Abs: 4485 cells/uL (ref 1500–7800)
Neutrophils Relative %: 54.7 %
Platelets: 413 10*3/uL — ABNORMAL HIGH (ref 140–400)
RBC: 4.7 10*6/uL (ref 3.80–5.10)
RDW: 13.7 % (ref 11.0–15.0)
Total Lymphocyte: 35.2 %
WBC: 8.2 10*3/uL (ref 3.8–10.8)

## 2020-11-23 LAB — URINALYSIS, ROUTINE W REFLEX MICROSCOPIC
Bacteria, UA: NONE SEEN /HPF
Bilirubin Urine: NEGATIVE
Glucose, UA: NEGATIVE
Hyaline Cast: NONE SEEN /LPF
Ketones, ur: NEGATIVE
Leukocytes,Ua: NEGATIVE
Nitrite: NEGATIVE
Protein, ur: NEGATIVE
Specific Gravity, Urine: 1.004 (ref 1.001–1.035)
WBC, UA: NONE SEEN /HPF (ref 0–5)
pH: 6 (ref 5.0–8.0)

## 2020-11-23 LAB — ANTI-NUCLEAR AB-TITER (ANA TITER): ANA Titer 1: 1:320 {titer} — ABNORMAL HIGH

## 2020-11-23 LAB — ANA: Anti Nuclear Antibody (ANA): POSITIVE — AB

## 2020-11-23 LAB — SEDIMENTATION RATE: Sed Rate: 29 mm/h — ABNORMAL HIGH (ref 0–20)

## 2020-11-23 LAB — C3 AND C4
C3 Complement: 176 mg/dL (ref 83–193)
C4 Complement: 35 mg/dL (ref 15–57)

## 2020-11-23 LAB — ANTI-DNA ANTIBODY, DOUBLE-STRANDED: ds DNA Ab: <1 IU/mL

## 2020-11-23 NOTE — Progress Notes (Signed)
CBC is normal, CMP is normal, UA showed 2+ hemoglobin,ANA positive, double-stranded DNA negative, complements normal, sed rate 29 mildly elevated.  No change in treatment advised.  I will discuss results at the follow-up visit.

## 2020-11-27 ENCOUNTER — Other Ambulatory Visit: Payer: Self-pay

## 2020-11-27 ENCOUNTER — Other Ambulatory Visit (HOSPITAL_COMMUNITY)
Admission: RE | Admit: 2020-11-27 | Discharge: 2020-11-27 | Disposition: A | Discharge status: Home / Self Care | Payer: No Typology Code available for payment source | Source: Ambulatory Visit | Attending: Family Medicine | Admitting: Family Medicine

## 2020-11-27 ENCOUNTER — Encounter: Payer: Self-pay | Admitting: Family Medicine

## 2020-11-27 ENCOUNTER — Ambulatory Visit (INDEPENDENT_AMBULATORY_CARE_PROVIDER_SITE_OTHER): Payer: No Typology Code available for payment source | Admitting: Family Medicine

## 2020-11-27 VITALS — BP 124/84 | HR 90 | Temp 98.1°F | Ht 62.25 in | Wt 161.4 lb

## 2020-11-27 DIAGNOSIS — N921 Excessive and frequent menstruation with irregular cycle: Secondary | ICD-10-CM | POA: Insufficient documentation

## 2020-11-27 DIAGNOSIS — N898 Other specified noninflammatory disorders of vagina: Secondary | ICD-10-CM | POA: Diagnosis not present

## 2020-11-27 DIAGNOSIS — R319 Hematuria, unspecified: Secondary | ICD-10-CM

## 2020-11-27 DIAGNOSIS — R87610 Atypical squamous cells of undetermined significance on cytologic smear of cervix (ASC-US): Secondary | ICD-10-CM

## 2020-11-27 DIAGNOSIS — E781 Pure hyperglyceridemia: Secondary | ICD-10-CM

## 2020-11-27 DIAGNOSIS — R635 Abnormal weight gain: Secondary | ICD-10-CM

## 2020-11-27 NOTE — Patient Instructions (Signed)
Consider beach body - 21 day fix real time and real time extreme 

## 2020-11-27 NOTE — Progress Notes (Signed)
Lindsay Wong DOB: 1980-07-05 Encounter date: 11/27/2020  This is a 40 y.o. female who presents with Chief Complaint  Patient presents with      Menstrual Problem    Patient complains of spotting in between cycles    History of present illness:  For past few months between periods she is having blood tinged disharge between periods. Hasn't this month. Just hasn't had that before. When she has period does still have clots. Platelets were slightly elevated on bloodwork through rheum; so wasn't sure if this was related. Periods are between 20-29 days; somewhat irregular. Usually menstruates for at least a week; sometimes longer. Has about 4 heavy days. Changes every hour on heavy days; sometimes leaks through these. Has not always been heavy. Has been this way for 6 months-or so. Had the discharge for 3 months. When on period hurts generally in pelvis, lower back. Legs always feel heavy on period.   Steroid cream helping with skin - broke out in scabbed/scaled lesions in sun.   Hasn't had any vertigo since having neck surgery - they went in and worked on bone spurs and discs. Feels a ton better. She didn't realize how bad she was hurting until she wasn't hurting. Improvement was immediate after surgery.   Right now pain/muscles/energy are doing ok. At beach she started hurting and didn't feel well for a couple days after being out on beach. Still on plaquenil with rheumatology; definitely not as frequent episodes.   Mood is doing pretty well.    Allergies  Allergen Reactions   Clarithromycin Shortness Of Breath    REACTION: hives   Peg-2000 (Covid-19 Mrna Vaccine Component) Anaphylaxis   Latex     Skin irritation from gloves   Current Meds  Medication Sig   amLODipine (NORVASC) 5 MG tablet TAKE 1 TABLET BY MOUTH DAILY.   dexamethasone (DECADRON) 1 MG tablet Take 1 tablet by mouth prior to bloodwork as directed.   DULoxetine (CYMBALTA) 30 MG capsule Take 1 capsule by mouth 2 times a  day   esomeprazole (NEXIUM) 40 MG capsule TAKE 1 CAPSULE BY MOUTH ONCE A DAY   Ferrous Sulfate (IRON PO) Take by mouth 3 (three) times a week.   hydroxychloroquine (PLAQUENIL) 200 MG tablet TAKE 1 TABLET BY MOUTH 2 TIMES DAILY ON MONDAY THROUGH FRIDAY   levocetirizine (XYZAL) 5 MG tablet TAKE 1 TABLET BY MOUTH 2 TIMES DAILY AS NEEDED FOR ALLERGIES (DUE FOR OFFICE VISIT)   VITAMIN D PO Take 2,000 Units by mouth daily.    Current Facility-Administered Medications for the 11/27/20 encounter (Office Visit) with Wynn Banker, MD  Medication   0.9 %  sodium chloride infusion    Review of Systems  Constitutional:  Positive for unexpected weight change (gaining). Negative for chills, fatigue and fever.  Respiratory:  Negative for cough, chest tightness, shortness of breath and wheezing.   Cardiovascular:  Negative for chest pain, palpitations and leg swelling.  Gastrointestinal:  Negative for blood in stool, constipation and diarrhea.  Genitourinary:  Positive for dyspareunia (sometimes), vaginal bleeding (see hpi) and vaginal discharge. Negative for dysuria, frequency, genital sores and hematuria (hasn't noted blood in urine but had microscopic +on recent test; feels she had menstrual bleeding at that time). Menstrual problem: see above. Musculoskeletal:  Arthralgias: feeling much better overall.   Objective:  BP 124/84   Pulse 90   Temp 98.1 F (36.7 C) (Oral)   Ht 5' 2.25" (1.581 m)   Wt 161 lb 6.4 oz (  73.2 kg)   LMP 11/15/2020   SpO2 99%   BMI 29.28 kg/m   Weight: 161 lb 6.4 oz (73.2 kg)   BP Readings from Last 3 Encounters:  11/27/20 124/84  07/04/20 118/75  07/03/20 (!) 147/82   Wt Readings from Last 3 Encounters:  11/27/20 161 lb 6.4 oz (73.2 kg)  07/04/20 155 lb (70.3 kg)  07/03/20 157 lb (71.2 kg)    Physical Exam Constitutional:      General: She is not in acute distress.    Appearance: She is well-developed.  Cardiovascular:     Rate and Rhythm: Normal rate  and regular rhythm.     Heart sounds: Normal heart sounds. No murmur heard.   No friction rub.  Pulmonary:     Effort: Pulmonary effort is normal. No respiratory distress.     Breath sounds: Normal breath sounds. No wheezing or rales.  Genitourinary:    Exam position: Supine.     Vagina: Vaginal discharge (clear,grey) present.     Cervix: Discharge (clear, mucous) present. No erythema.     Uterus: Normal.      Adnexa: Right adnexa normal and left adnexa normal.     Comments: Mild degree atrophy vaginal mucosa; there is slight erythema inferior introitus with mild tenderness to palpation and pin point scab.  Musculoskeletal:     Right lower leg: No edema.     Left lower leg: No edema.  Neurological:     Mental Status: She is alert and oriented to person, place, and time.  Psychiatric:        Behavior: Behavior normal.    Assessment/Plan 1. Menometrorrhagia Periods heavier and with spotting between cycles. Repeat pap today, cultures obtained. Consider [place] and may need gyn referral pending these results for further eval/colposcopy/endometrial bx. - PAP [[place]] - dexamethasone (DECADRON) 1 MG tablet; Take 1 tablet by mouth prior to bloodwork as directed.  Dispense: 1 tablet; Refill: 0 - Iron, TIBC and Ferritin Panel; Future - TSH; Future - T4, free; Future  2. Atypical squamous cells of undetermined significance on cytologic smear of cervix (ASC-US) Abnormal prior pap; repeat today due to symptoms. Will include high risk hpv testing. - PAP [[place]]  3. Hematuria, unspecified type Recheck now that she is not having vaginal spotting.  - Urinalysis with Culture Reflex  4. Vaginal discharge See above.  - Cervicovaginal ancillary only  5. Hypertriglyceridemia Recheck lipids.  - Lipid panel; Future  6. Weight gain Uncertain etiology. She is following low calorie diet. She is walking 3 days/week. We discussed more high intensity interval exercises for her and increase  in overall activity level. May be worthwhile to consider functional medicine if our initial evaluation is not revealing. - dexamethasone (DECADRON) 1 MG tablet; Take 1 tablet by mouth prior to bloodwork as directed.  Dispense: 1 tablet; Refill: 0 - Cortisol-am, blood; Future - Insulin, Free (Bioactive); Future   Return for pending bloodwork results.      Theodis Shove, MD

## 2020-11-28 ENCOUNTER — Other Ambulatory Visit (HOSPITAL_COMMUNITY): Payer: Self-pay

## 2020-11-28 LAB — CERVICOVAGINAL ANCILLARY ONLY
Bacterial Vaginitis (gardnerella): NEGATIVE
Candida Glabrata: NEGATIVE
Candida Vaginitis: NEGATIVE
Chlamydia: NEGATIVE
Comment: NEGATIVE
Comment: NEGATIVE
Comment: NEGATIVE
Comment: NEGATIVE
Comment: NEGATIVE
Comment: NORMAL
Neisseria Gonorrhea: NEGATIVE
Trichomonas: NEGATIVE

## 2020-11-28 LAB — URINALYSIS W MICROSCOPIC + REFLEX CULTURE

## 2020-11-28 MED ORDER — DEXAMETHASONE 1 MG PO TABS
ORAL_TABLET | ORAL | 0 refills | Status: DC
  Filled 2020-11-28: qty 1, 1d supply, fill #0

## 2020-11-29 ENCOUNTER — Other Ambulatory Visit (INDEPENDENT_AMBULATORY_CARE_PROVIDER_SITE_OTHER): Payer: No Typology Code available for payment source

## 2020-11-29 ENCOUNTER — Encounter: Payer: Self-pay | Admitting: Family Medicine

## 2020-11-29 ENCOUNTER — Telehealth: Payer: Self-pay | Admitting: *Deleted

## 2020-11-29 ENCOUNTER — Other Ambulatory Visit: Payer: Self-pay

## 2020-11-29 DIAGNOSIS — R635 Abnormal weight gain: Secondary | ICD-10-CM

## 2020-11-29 DIAGNOSIS — N921 Excessive and frequent menstruation with irregular cycle: Secondary | ICD-10-CM | POA: Diagnosis not present

## 2020-11-29 DIAGNOSIS — N898 Other specified noninflammatory disorders of vagina: Secondary | ICD-10-CM

## 2020-11-29 DIAGNOSIS — E781 Pure hyperglyceridemia: Secondary | ICD-10-CM

## 2020-11-29 LAB — LIPID PANEL
Cholesterol: 240 mg/dL — ABNORMAL HIGH (ref 0–200)
HDL: 70.2 mg/dL (ref >39.00)
LDL Cholesterol: 152 mg/dL — ABNORMAL HIGH (ref 0–99)
NonHDL: 169.79
Total CHOL/HDL Ratio: 3
Triglycerides: 89 mg/dL (ref 0.0–149.0)
VLDL: 17.8 mg/dL (ref 0.0–40.0)

## 2020-11-29 LAB — CYTOLOGY - PAP
Comment: NEGATIVE
Diagnosis: UNDETERMINED — AB
High risk HPV: NEGATIVE

## 2020-11-29 LAB — T4, FREE: Free T4: 0.71 ng/dL (ref 0.60–1.60)

## 2020-11-29 LAB — TSH: TSH: 3.66 u[IU]/mL (ref 0.35–5.50)

## 2020-11-29 NOTE — Addendum Note (Signed)
Addended by: Bonnye Fava on: 11/29/2020 07:07 AM   Modules accepted: Orders

## 2020-11-29 NOTE — Telephone Encounter (Signed)
-----   Message from Wynn Banker, MD sent at 11/28/2020 10:14 AM EDT ----- Please make sure patient has morning lab appointment set up (whatever day convenient for her). I sent in dexamethasone - a single dose of steroid for her to take in the evening at 11pm-midnight and then she should do bloodwork at 8am the following morning. I added a few other tests I think are pertinent with her symptoms. Other followup will be pending these results/pap results.

## 2020-11-29 NOTE — Addendum Note (Signed)
Addended by: Joleena Weisenburger K on: 11/29/2020 07:07 AM   Modules accepted: Orders  

## 2020-11-29 NOTE — Telephone Encounter (Signed)
Please see prior note (cc'd chart note).

## 2020-11-29 NOTE — Telephone Encounter (Signed)
Spoke with the pt and informed her of the message below.  Lab appt scheduled for 7/21 to arrive at 8:30am.

## 2020-11-30 ENCOUNTER — Other Ambulatory Visit (INDEPENDENT_AMBULATORY_CARE_PROVIDER_SITE_OTHER): Payer: No Typology Code available for payment source

## 2020-11-30 ENCOUNTER — Other Ambulatory Visit (HOSPITAL_COMMUNITY): Payer: Self-pay

## 2020-11-30 ENCOUNTER — Ambulatory Visit: Payer: No Typology Code available for payment source | Admitting: Rheumatology

## 2020-11-30 ENCOUNTER — Encounter: Payer: Self-pay | Admitting: Rheumatology

## 2020-11-30 VITALS — BP 138/88 | HR 98 | Ht 63.0 in | Wt 161.4 lb

## 2020-11-30 DIAGNOSIS — Z8669 Personal history of other diseases of the nervous system and sense organs: Secondary | ICD-10-CM

## 2020-11-30 DIAGNOSIS — M170 Bilateral primary osteoarthritis of knee: Secondary | ICD-10-CM | POA: Diagnosis not present

## 2020-11-30 DIAGNOSIS — Z820 Family history of epilepsy and other diseases of the nervous system: Secondary | ICD-10-CM

## 2020-11-30 DIAGNOSIS — M359 Systemic involvement of connective tissue, unspecified: Secondary | ICD-10-CM | POA: Diagnosis not present

## 2020-11-30 DIAGNOSIS — Z79899 Other long term (current) drug therapy: Secondary | ICD-10-CM | POA: Diagnosis not present

## 2020-11-30 DIAGNOSIS — R030 Elevated blood-pressure reading, without diagnosis of hypertension: Secondary | ICD-10-CM

## 2020-11-30 DIAGNOSIS — O1493 Unspecified pre-eclampsia, third trimester: Secondary | ICD-10-CM

## 2020-11-30 DIAGNOSIS — R635 Abnormal weight gain: Secondary | ICD-10-CM

## 2020-11-30 DIAGNOSIS — R5383 Other fatigue: Secondary | ICD-10-CM

## 2020-11-30 DIAGNOSIS — Z8719 Personal history of other diseases of the digestive system: Secondary | ICD-10-CM

## 2020-11-30 DIAGNOSIS — F419 Anxiety disorder, unspecified: Secondary | ICD-10-CM

## 2020-11-30 DIAGNOSIS — M5030 Other cervical disc degeneration, unspecified cervical region: Secondary | ICD-10-CM

## 2020-11-30 DIAGNOSIS — F32A Depression, unspecified: Secondary | ICD-10-CM

## 2020-11-30 DIAGNOSIS — I7300 Raynaud's syndrome without gangrene: Secondary | ICD-10-CM

## 2020-11-30 DIAGNOSIS — R21 Rash and other nonspecific skin eruption: Secondary | ICD-10-CM

## 2020-11-30 LAB — URINALYSIS W MICROSCOPIC + REFLEX CULTURE
Bacteria, UA: NONE SEEN /HPF
Bilirubin Urine: NEGATIVE
Glucose, UA: NEGATIVE
Hgb urine dipstick: NEGATIVE
Hyaline Cast: NONE SEEN /LPF
Ketones, ur: NEGATIVE
Leukocyte Esterase: NEGATIVE
Nitrites, Initial: NEGATIVE
Protein, ur: NEGATIVE
RBC / HPF: NONE SEEN /HPF (ref 0–2)
Specific Gravity, Urine: 1.014 (ref 1.001–1.035)
WBC, UA: NONE SEEN /HPF (ref 0–5)
pH: 6.5 (ref 5.0–8.0)

## 2020-11-30 LAB — NO CULTURE INDICATED

## 2020-11-30 MED FILL — Levocetirizine Dihydrochloride Tab 5 MG: ORAL | 30 days supply | Qty: 60 | Fill #0 | Status: CN

## 2020-11-30 NOTE — Patient Instructions (Signed)
Vaccines You are taking a medication(s) that can suppress your immune system.  The following immunizations are recommended: Flu annually Covid-19  Td/Tdap (tetanus, diphtheria, pertussis) every 10 years Pneumonia (Prevnar 15 then Pneumovax 23 at least 1 year apart.  Alternatively, can take Prevnar 20 without needing additional dose) Shingrix (after age 42): 2 doses from 4 weeks to 6 months apart  Please check with your PCP to make sure you are up to date.   Heart Disease Prevention   Your inflammatory disease increases your risk of heart disease which includes heart attack, stroke, atrial fibrillation (irregular heartbeats), high blood pressure, heart failure and atherosclerosis (plaque in the arteries).  It is important to reduce your risk by:   Keep blood pressure, cholesterol, and blood sugar at healthy levels   Smoking Cessation   Maintain a healthy weight  BMI 20-25   Eat a healthy diet  Plenty of fresh fruit, vegetables, and whole grains  Limit saturated fats, foods high in sodium, and added sugars  DASH and Mediterranean diet   Increase physical activity  Recommend moderate physically activity for 150 minutes per week/ 30 minutes a day for five days a week These can be broken up into three separate ten-minute sessions during the day.   Reduce Stress  Meditation, slow breathing exercises, yoga, coloring books  Dental visits twice a year   

## 2020-12-01 ENCOUNTER — Other Ambulatory Visit: Payer: Self-pay | Admitting: Family Medicine

## 2020-12-01 DIAGNOSIS — N921 Excessive and frequent menstruation with irregular cycle: Secondary | ICD-10-CM

## 2020-12-01 LAB — CORTISOL-AM, BLOOD: Cortisol - AM: 1.3 ug/dL — ABNORMAL LOW

## 2020-12-07 LAB — IRON,TIBC AND FERRITIN PANEL
%SAT: 9 % (calc) — ABNORMAL LOW (ref 16–45)
Ferritin: 9 ng/mL — ABNORMAL LOW (ref 16–232)
Iron: 36 ug/dL — ABNORMAL LOW (ref 40–190)
TIBC: 383 mcg/dL (calc) (ref 250–450)

## 2020-12-07 LAB — INSULIN, FREE (BIOACTIVE): Insulin, Free: 14.5 u[IU]/mL (ref 1.5–14.9)

## 2020-12-07 LAB — CORTISOL-AM, BLOOD: Cortisol - AM: 21.4 ug/dL

## 2020-12-09 ENCOUNTER — Other Ambulatory Visit: Payer: Self-pay | Admitting: Rheumatology

## 2020-12-09 ENCOUNTER — Other Ambulatory Visit: Payer: Self-pay | Admitting: Physician Assistant

## 2020-12-11 ENCOUNTER — Other Ambulatory Visit (HOSPITAL_COMMUNITY): Payer: Self-pay

## 2020-12-11 MED ORDER — AMLODIPINE BESYLATE 5 MG PO TABS
ORAL_TABLET | Freq: Every day | ORAL | 0 refills | Status: DC
  Filled 2020-12-11: qty 90, 90d supply, fill #0

## 2020-12-11 MED ORDER — HYDROXYCHLOROQUINE SULFATE 200 MG PO TABS
ORAL_TABLET | ORAL | 0 refills | Status: DC
Start: 2020-12-11 — End: 2021-05-04
  Filled 2020-12-11: qty 120, 84d supply, fill #0

## 2020-12-11 NOTE — Telephone Encounter (Signed)
Next Visit: 05/01/2021  Last Visit: 11/30/2020  Last Fill: 06/19/2020  DX: Raynaud's syndrome without gangrene  Current Dose per office note on 11/30/2020: dose not specified.   Okay to refill amlodipine?

## 2020-12-11 NOTE — Telephone Encounter (Signed)
Last Visit: 11/30/2020 Next Visit: 05/01/2021 Labs: 11/20/2020 CBC is normal, CMP is normal, UA showed 2+ hemoglobin,ANA positive, double-stranded DNA negative, complements normal, sed rate 29 mildly elevated. No change in treatment advised.  Eye exam: 03/23/2020   Current Dose per office note on 11/30/2020: PLQ 200 mg BID M-F. FE:[id number] disease  Last Fill: 10/02/2020  Okay to refill Plaquenil?

## 2020-12-13 ENCOUNTER — Encounter: Payer: Self-pay | Admitting: Family Medicine

## 2020-12-13 DIAGNOSIS — L989 Disorder of the skin and subcutaneous tissue, unspecified: Secondary | ICD-10-CM

## 2020-12-25 ENCOUNTER — Encounter: Payer: Self-pay | Admitting: Family Medicine

## 2020-12-25 ENCOUNTER — Other Ambulatory Visit (HOSPITAL_COMMUNITY): Payer: Self-pay

## 2020-12-25 DIAGNOSIS — M359 Systemic involvement of connective tissue, unspecified: Secondary | ICD-10-CM

## 2020-12-25 MED ORDER — VYVANSE 40 MG PO CAPS
40.0000 mg | ORAL_CAPSULE | Freq: Every morning | ORAL | 0 refills | Status: DC
  Filled 2020-12-25: qty 30, 30d supply, fill #0

## 2020-12-25 MED ORDER — DULOXETINE HCL 30 MG PO CPEP
ORAL_CAPSULE | ORAL | 1 refills | Status: DC
  Filled 2020-12-25: qty 90, 30d supply, fill #0

## 2020-12-26 ENCOUNTER — Ambulatory Visit
Admission: RE | Admit: 2020-12-26 | Discharge: 2020-12-26 | Disposition: A | Discharge status: Home / Self Care | Payer: No Typology Code available for payment source | Source: Ambulatory Visit | Attending: Family Medicine | Admitting: Family Medicine

## 2020-12-26 ENCOUNTER — Encounter: Payer: Self-pay | Admitting: Family Medicine

## 2020-12-26 DIAGNOSIS — N921 Excessive and frequent menstruation with irregular cycle: Secondary | ICD-10-CM

## 2020-12-26 DIAGNOSIS — Z79899 Other long term (current) drug therapy: Secondary | ICD-10-CM

## 2021-01-04 ENCOUNTER — Other Ambulatory Visit: Payer: Self-pay | Admitting: *Deleted

## 2021-01-04 ENCOUNTER — Other Ambulatory Visit (HOSPITAL_COMMUNITY): Payer: Self-pay

## 2021-01-04 ENCOUNTER — Encounter: Payer: Self-pay | Admitting: Family Medicine

## 2021-01-04 DIAGNOSIS — R87610 Atypical squamous cells of undetermined significance on cytologic smear of cervix (ASC-US): Secondary | ICD-10-CM

## 2021-01-04 DIAGNOSIS — N939 Abnormal uterine and vaginal bleeding, unspecified: Secondary | ICD-10-CM

## 2021-01-17 ENCOUNTER — Encounter: Payer: Self-pay | Admitting: Rheumatology

## 2021-01-23 ENCOUNTER — Other Ambulatory Visit (HOSPITAL_COMMUNITY): Payer: Self-pay

## 2021-01-23 MED ORDER — VYVANSE 40 MG PO CAPS
ORAL_CAPSULE | ORAL | 0 refills | Status: DC
  Filled 2021-01-23: qty 30, 30d supply, fill #0

## 2021-01-23 MED ORDER — DULOXETINE HCL 30 MG PO CPEP: ORAL_CAPSULE | ORAL | 1 refills | Status: DC

## 2021-01-23 MED ORDER — HYDROXYZINE HCL 25 MG PO TABS
ORAL_TABLET | ORAL | 1 refills | Status: DC
  Filled 2021-01-23: qty 60, 30d supply, fill #0

## 2021-01-23 MED ORDER — VYVANSE 40 MG PO CAPS: ORAL_CAPSULE | ORAL | 0 refills | Status: DC

## 2021-01-26 ENCOUNTER — Other Ambulatory Visit (HOSPITAL_COMMUNITY): Payer: Self-pay

## 2021-01-26 MED ORDER — IBUPROFEN 800 MG PO TABS
ORAL_TABLET | ORAL | 0 refills | Status: DC
  Filled 2021-01-26: qty 20, 5d supply, fill #0

## 2021-01-26 MED ORDER — SULFAMETHOXAZOLE-TRIMETHOPRIM 800-160 MG PO TABS
ORAL_TABLET | ORAL | 0 refills | Status: DC
  Filled 2021-01-26: qty 10, 5d supply, fill #0

## 2021-02-11 ENCOUNTER — Encounter: Payer: Self-pay | Admitting: Family Medicine

## 2021-02-11 DIAGNOSIS — F32A Depression, unspecified: Secondary | ICD-10-CM

## 2021-02-11 DIAGNOSIS — F419 Anxiety disorder, unspecified: Secondary | ICD-10-CM

## 2021-02-16 ENCOUNTER — Other Ambulatory Visit (HOSPITAL_COMMUNITY): Payer: Self-pay

## 2021-02-19 ENCOUNTER — Other Ambulatory Visit (HOSPITAL_COMMUNITY): Payer: Self-pay

## 2021-02-21 ENCOUNTER — Other Ambulatory Visit (HOSPITAL_COMMUNITY): Payer: Self-pay

## 2021-03-05 ENCOUNTER — Other Ambulatory Visit: Payer: Self-pay

## 2021-03-05 ENCOUNTER — Ambulatory Visit (INDEPENDENT_AMBULATORY_CARE_PROVIDER_SITE_OTHER): Payer: No Typology Code available for payment source

## 2021-03-05 DIAGNOSIS — Z23 Encounter for immunization: Secondary | ICD-10-CM

## 2021-03-20 ENCOUNTER — Other Ambulatory Visit (HOSPITAL_COMMUNITY): Payer: Self-pay

## 2021-03-20 MED ORDER — LO LOESTRIN FE 1 MG-10 MCG / 10 MCG PO TABS
ORAL_TABLET | ORAL | 4 refills | Status: DC
  Filled 2021-03-20 – 2021-06-21 (×2): qty 84, 84d supply, fill #0
  Filled 2021-07-19: qty 84, 84d supply, fill #1

## 2021-03-21 ENCOUNTER — Other Ambulatory Visit (HOSPITAL_COMMUNITY): Payer: Self-pay

## 2021-03-22 ENCOUNTER — Other Ambulatory Visit: Payer: Self-pay | Admitting: Physician Assistant

## 2021-03-22 ENCOUNTER — Other Ambulatory Visit (HOSPITAL_COMMUNITY): Payer: Self-pay

## 2021-03-22 ENCOUNTER — Other Ambulatory Visit: Payer: Self-pay | Admitting: Family Medicine

## 2021-03-22 MED ORDER — AMLODIPINE BESYLATE 5 MG PO TABS
ORAL_TABLET | Freq: Every day | ORAL | 0 refills | Status: DC
  Filled 2021-03-22: qty 90, 90d supply, fill #0

## 2021-03-22 MED ORDER — ESOMEPRAZOLE MAGNESIUM 40 MG PO CPDR
DELAYED_RELEASE_CAPSULE | Freq: Every day | ORAL | 3 refills | Status: DC
  Filled 2021-03-22: qty 30, 30d supply, fill #0
  Filled 2021-04-23: qty 30, 30d supply, fill #1
  Filled 2021-07-09: qty 30, 30d supply, fill #2
  Filled 2021-08-10: qty 30, 30d supply, fill #3

## 2021-03-22 NOTE — Telephone Encounter (Signed)
Last Visit: 11/30/2020  Next Visit: 05/01/2021  Last Fill: 12/11/2020   DX: Raynaud's syndrome without gangrene   Current Dose per office note on 11/30/2020: dose not specified.    Okay to refill amlodipine?

## 2021-04-02 ENCOUNTER — Other Ambulatory Visit (HOSPITAL_COMMUNITY): Payer: Self-pay

## 2021-04-06 ENCOUNTER — Other Ambulatory Visit (HOSPITAL_COMMUNITY): Payer: Self-pay

## 2021-04-10 ENCOUNTER — Other Ambulatory Visit (HOSPITAL_COMMUNITY): Payer: Self-pay

## 2021-04-13 ENCOUNTER — Other Ambulatory Visit (HOSPITAL_COMMUNITY): Payer: Self-pay

## 2021-04-16 ENCOUNTER — Other Ambulatory Visit: Payer: Self-pay | Admitting: *Deleted

## 2021-04-16 DIAGNOSIS — Z79899 Other long term (current) drug therapy: Secondary | ICD-10-CM

## 2021-04-16 DIAGNOSIS — M359 Systemic involvement of connective tissue, unspecified: Secondary | ICD-10-CM

## 2021-04-17 ENCOUNTER — Telehealth: Payer: Self-pay | Admitting: *Deleted

## 2021-04-17 DIAGNOSIS — Z79899 Other long term (current) drug therapy: Secondary | ICD-10-CM

## 2021-04-17 LAB — URINALYSIS, ROUTINE W REFLEX MICROSCOPIC
Bilirubin Urine: NEGATIVE
Glucose, UA: NEGATIVE
Hgb urine dipstick: NEGATIVE
Ketones, ur: NEGATIVE
Leukocytes,Ua: NEGATIVE
Nitrite: NEGATIVE
Protein, ur: NEGATIVE
Specific Gravity, Urine: 1.013 (ref 1.001–1.035)
pH: 6.5 (ref 5.0–8.0)

## 2021-04-17 LAB — CBC WITH DIFFERENTIAL/PLATELET
Absolute Monocytes: 586 cells/uL (ref 200–950)
Basophils Absolute: 58 cells/uL (ref 0–200)
Basophils Relative: 0.6 %
Eosinophils Absolute: 125 cells/uL (ref 15–500)
Eosinophils Relative: 1.3 %
HCT: 38.9 % (ref 35.0–45.0)
Hemoglobin: 13 g/dL (ref 11.7–15.5)
Lymphs Abs: 2726 cells/uL (ref 850–3900)
MCH: 27.8 pg (ref 27.0–33.0)
MCHC: 33.4 g/dL (ref 32.0–36.0)
MCV: 83.3 fL (ref 80.0–100.0)
MPV: 10.6 fL (ref 7.5–12.5)
Monocytes Relative: 6.1 %
Neutro Abs: 6106 cells/uL (ref 1500–7800)
Neutrophils Relative %: 63.6 %
Platelets: 376 10*3/uL (ref 140–400)
RBC: 4.67 10*6/uL (ref 3.80–5.10)
RDW: 13.5 % (ref 11.0–15.0)
Total Lymphocyte: 28.4 %
WBC: 9.6 10*3/uL (ref 3.8–10.8)

## 2021-04-17 LAB — COMPLETE METABOLIC PANEL WITH GFR
AG Ratio: 1.3 (calc) (ref 1.0–2.5)
ALT: 13 U/L (ref 6–29)
AST: 13 U/L (ref 10–30)
Albumin: 4.2 g/dL (ref 3.6–5.1)
Alkaline phosphatase (APISO): 77 U/L (ref 31–125)
BUN/Creatinine Ratio: 11 (calc) (ref 6–22)
BUN: 14 mg/dL (ref 7–25)
CO2: 26 mmol/L (ref 20–32)
Calcium: 9.3 mg/dL (ref 8.6–10.2)
Chloride: 100 mmol/L (ref 98–110)
Creat: 1.24 mg/dL — ABNORMAL HIGH (ref 0.50–0.99)
Globulin: 3.2 g/dL (calc) (ref 1.9–3.7)
Glucose, Bld: 88 mg/dL (ref 65–99)
Potassium: 4 mmol/L (ref 3.5–5.3)
Sodium: 136 mmol/L (ref 135–146)
Total Bilirubin: 0.6 mg/dL (ref 0.2–1.2)
Total Protein: 7.4 g/dL (ref 6.1–8.1)
eGFR: 56 mL/min/{1.73_m2} — ABNORMAL LOW (ref >=60)

## 2021-04-17 LAB — C3 AND C4
C3 Complement: 190 mg/dL (ref 83–193)
C4 Complement: 38 mg/dL (ref 15–57)

## 2021-04-17 LAB — ANTI-DNA ANTIBODY, DOUBLE-STRANDED: ds DNA Ab: <1 IU/mL

## 2021-04-17 LAB — SEDIMENTATION RATE: Sed Rate: 28 mm/h — ABNORMAL HIGH (ref 0–20)

## 2021-04-17 NOTE — Telephone Encounter (Signed)
-----   Message from Pollyann Savoy, MD sent at 04/17/2021  9:08 AM EST ----- Sed rate (inflammation test) is mildly elevated and is stable.  Complements are normal which do not indicate active disease.  CBC and UA are normal.  Creatinine is elevated at 1.24.  Which could be due to anti-inflammatory use or dehydration.  Please  advise patient to avoid all anti-inflammatories and repeat BMP with GFR in 1 week.

## 2021-04-17 NOTE — Progress Notes (Deleted)
Office Visit Note  Patient: Lindsay Wong             Date of Birth: June 10, 1980           MRN: [id number]             PCP: Wynn Banker, MD Referring: Wynn Banker, MD Visit Date: 05/01/2021 Occupation: @GUAROCC @  Subjective:  No chief complaint on file.   History of Present Illness: Lindsay Wong is a 40 y.o. female ***   Activities of Daily Living:  Patient reports morning stiffness for *** {minute/hour:19697}.   Patient {ACTIONS;DENIES/REPORTS:21021675::"Denies"} nocturnal pain.  Difficulty dressing/grooming: {ACTIONS;DENIES/REPORTS:21021675::"Denies"} Difficulty climbing stairs: {ACTIONS;DENIES/REPORTS:21021675::"Denies"} Difficulty getting out of chair: {ACTIONS;DENIES/REPORTS:21021675::"Denies"} Difficulty using hands for taps, buttons, cutlery, and/or writing: {ACTIONS;DENIES/REPORTS:21021675::"Denies"}  No Rheumatology ROS completed.   PMFS History:  Patient Active Problem List   Diagnosis Date Noted  . Foraminal stenosis of cervical region 07/04/2020  . Adverse effect of other viral vaccines, subsequent encounter 01/27/2020  . Spinal stenosis of cervical region 12/28/2019  . Fatigue 08/20/2019  . Anxiety and depression 05/19/2017  . Left shoulder pain 07/19/2014  . RAYNAUD'S DISEASE 01/31/2007    Past Medical History:  Diagnosis Date  . Allergy   . Anxiety and depression    per patient diagnosed by psychiatrist  . Appendicitis 07/20/2012  . Blood in stool   . Chicken pox   . Depression    postpartum with one child  . Fatty liver   . GERD 01/31/2007   Qualifier: Diagnosis of  By: 02/02/2007 RMA, [name]    . Hyperlipidemia    postpartum  . PONV (postoperative nausea and vomiting)   . Positive ANA (antinuclear antibody)   . RAYNAUD'S DISEASE 01/31/2007   Qualifier: Diagnosis of  By: 02/02/2007 RMA, [name]    . Urticaria     Family History  Problem Relation Age of Onset  . Early menopause Mother   . Other Father        no contact since infant  .  Raynaud syndrome Sister   . Cancer Maternal Grandmother        melanoma  . Stroke Maternal Grandmother 45  . Arthritis Maternal Grandmother   . Hyperlipidemia Maternal Grandmother   . Hypertension Maternal Grandmother   . Breast cancer Maternal Grandmother 72  . Cancer Maternal Grandfather        bladder  . Bladder Cancer Maternal Grandfather   . Lung cancer Other        all but one of grandmother's siblings   . Lupus Maternal Uncle   . Leukemia Maternal Uncle   . Multiple sclerosis Cousin   . Autism Son   . Healthy Son   . Healthy Son   . Healthy Son   . Immunodeficiency Maternal Aunt   . Heart attack Maternal Aunt 49  . CAD Maternal Aunt   . Colon cancer Neg Hx   . Esophageal cancer Neg Hx   . Stomach cancer Neg Hx   . Rectal cancer Neg Hx    Past Surgical History:  Procedure Laterality Date  . ANTERIOR FUSION CERVICAL SPINE  07/2020  . CESAREAN SECTION     x3  . COLONOSCOPY  01/2020  . LAPAROSCOPIC APPENDECTOMY Right 07/20/2012   Procedure: APPENDECTOMY LAPAROSCOPIC;  Surgeon: 09/19/2012, MD;  Location: [facility];  Service: General;  Laterality: Right;  . WISDOM TOOTH EXTRACTION     Social History   Social History Narrative   Work or School: homemaker,  3 kids - 9,7 and 3 (2014)      Home Situation: lives with husband and children      Spiritual Beliefs: none      Lifestyle: hot yoga, weights and cardio, tries to eat a healthy diet      Lives in a 2 story home      Right handed         Immunization History  Administered Date(s) Administered  . Influenza Split 01/04/2011  . Influenza Whole 04/07/2006, 02/01/2008  . Influenza,inj,Quad PF,6+ Mos 01/14/2014, 02/08/2015, 02/20/2016, 02/12/2017, 02/13/2018, 03/01/2019, 03/05/2021  . Influenza-Unspecified 01/11/2013, 02/11/2016, 03/13/2020  . Janssen (J&J) SARS-COV-2 Vaccination 01/28/2020, 04/29/2020  . Tdap 03/01/2019     Objective: Vital Signs: There were no vitals taken for this visit.   Physical  Exam   Musculoskeletal Exam: ***  CDAI Exam: CDAI Score: -- Patient Global: --; Provider Global: -- Swollen: --; Tender: -- Joint Exam 05/01/2021   No joint exam has been documented for this visit   There is currently no information documented on the homunculus. Go to the Rheumatology activity and complete the homunculus joint exam.  Investigation: No additional findings.  Imaging: No results found.  Recent Labs: Lab Results  Component Value Date   WBC 9.6 04/16/2021   HGB 13.0 04/16/2021   PLT 376 04/16/2021   NA 136 04/16/2021   K 4.0 04/16/2021   CL 100 04/16/2021   CO2 26 04/16/2021   GLUCOSE 88 04/16/2021   BUN 14 04/16/2021   CREATININE 1.24 (H) 04/16/2021   BILITOT 0.6 04/16/2021   ALKPHOS 76 06/30/2020   AST 13 04/16/2021   ALT 13 04/16/2021   PROT 7.4 04/16/2021   ALBUMIN 4.3 06/30/2020   CALCIUM 9.3 04/16/2021   GFRAA 119 06/19/2020    Speciality Comments: PLQ Eye Exam: 03/23/2020 WNL @ [facility] Follow up in 1 year  Procedures:  No procedures performed Allergies: Clarithromycin, Peg-2000 (covid-19 mrna vaccine component), and Latex   Assessment / Plan:     Visit Diagnoses: No diagnosis found.  Orders: No orders of the defined types were placed in this encounter.  No orders of the defined types were placed in this encounter.   Face-to-face time spent with patient was *** minutes. Greater than 50% of time was spent in counseling and coordination of care.  Follow-Up Instructions: No follow-ups on file.   Earnestine Mealing, CMA  Note - This record has been created using Editor, commissioning.  Chart creation errors have been sought, but may not always  have been located. Such creation errors do not reflect on  the standard of medical care.

## 2021-04-17 NOTE — Progress Notes (Signed)
Sed rate (inflammation test) is mildly elevated and is stable.  Complements are normal which do not indicate active disease.  CBC and UA are normal.  Creatinine is elevated at 1.24.  Which could be due to anti-inflammatory use or dehydration.  Please advise patient to avoid all anti-inflammatories and repeat BMP with GFR in 1 week.

## 2021-04-18 ENCOUNTER — Other Ambulatory Visit (HOSPITAL_COMMUNITY): Payer: Self-pay

## 2021-04-18 MED ORDER — GABAPENTIN 100 MG PO CAPS
100.0000 mg | ORAL_CAPSULE | Freq: Three times a day (TID) | ORAL | 1 refills | Status: DC
  Filled 2021-04-18: qty 90, 30d supply, fill #0

## 2021-04-18 MED ORDER — HYDROXYZINE HCL 25 MG PO TABS
25.0000 mg | ORAL_TABLET | Freq: Two times a day (BID) | ORAL | 1 refills | Status: DC | PRN
  Filled 2021-04-18: qty 180, 90d supply, fill #0

## 2021-04-18 MED ORDER — DULOXETINE HCL 30 MG PO CPEP
30.0000 mg | ORAL_CAPSULE | ORAL | 1 refills | Status: DC
  Filled 2021-04-18: qty 270, 90d supply, fill #0

## 2021-04-18 MED ORDER — VYVANSE 40 MG PO CAPS
40.0000 mg | ORAL_CAPSULE | Freq: Every morning | ORAL | 0 refills | Status: DC
  Filled 2021-04-18: qty 30, 30d supply, fill #0

## 2021-04-19 ENCOUNTER — Other Ambulatory Visit (HOSPITAL_COMMUNITY): Payer: Self-pay

## 2021-04-23 ENCOUNTER — Other Ambulatory Visit (HOSPITAL_COMMUNITY): Payer: Self-pay

## 2021-04-23 ENCOUNTER — Other Ambulatory Visit: Payer: Self-pay | Admitting: *Deleted

## 2021-04-23 DIAGNOSIS — Z79899 Other long term (current) drug therapy: Secondary | ICD-10-CM

## 2021-04-24 LAB — BASIC METABOLIC PANEL WITH GFR
BUN: 12 mg/dL (ref 7–25)
CO2: 26 mmol/L (ref 20–32)
Calcium: 9 mg/dL (ref 8.6–10.2)
Chloride: 103 mmol/L (ref 98–110)
Creat: 0.84 mg/dL (ref 0.50–0.99)
Glucose, Bld: 100 mg/dL — ABNORMAL HIGH (ref 65–99)
Potassium: 4.1 mmol/L (ref 3.5–5.3)
Sodium: 137 mmol/L (ref 135–146)
eGFR: 89 mL/min/{1.73_m2} (ref >=60)

## 2021-04-24 NOTE — Progress Notes (Signed)
Creatinine is normal.  

## 2021-05-01 ENCOUNTER — Ambulatory Visit: Payer: No Typology Code available for payment source | Admitting: Rheumatology

## 2021-05-01 DIAGNOSIS — Z8669 Personal history of other diseases of the nervous system and sense organs: Secondary | ICD-10-CM

## 2021-05-01 DIAGNOSIS — I7300 Raynaud's syndrome without gangrene: Secondary | ICD-10-CM

## 2021-05-01 DIAGNOSIS — M5030 Other cervical disc degeneration, unspecified cervical region: Secondary | ICD-10-CM

## 2021-05-01 DIAGNOSIS — F32A Depression, unspecified: Secondary | ICD-10-CM

## 2021-05-01 DIAGNOSIS — M359 Systemic involvement of connective tissue, unspecified: Secondary | ICD-10-CM

## 2021-05-01 DIAGNOSIS — Z820 Family history of epilepsy and other diseases of the nervous system: Secondary | ICD-10-CM

## 2021-05-01 DIAGNOSIS — Z8719 Personal history of other diseases of the digestive system: Secondary | ICD-10-CM

## 2021-05-01 DIAGNOSIS — R5383 Other fatigue: Secondary | ICD-10-CM

## 2021-05-01 DIAGNOSIS — O1493 Unspecified pre-eclampsia, third trimester: Secondary | ICD-10-CM

## 2021-05-01 DIAGNOSIS — R030 Elevated blood-pressure reading, without diagnosis of hypertension: Secondary | ICD-10-CM

## 2021-05-01 DIAGNOSIS — Z79899 Other long term (current) drug therapy: Secondary | ICD-10-CM

## 2021-05-01 DIAGNOSIS — R21 Rash and other nonspecific skin eruption: Secondary | ICD-10-CM

## 2021-05-01 DIAGNOSIS — M170 Bilateral primary osteoarthritis of knee: Secondary | ICD-10-CM

## 2021-05-04 ENCOUNTER — Other Ambulatory Visit: Payer: Self-pay | Admitting: Physician Assistant

## 2021-05-04 MED ORDER — HYDROXYCHLOROQUINE SULFATE 200 MG PO TABS
ORAL_TABLET | ORAL | 0 refills | Status: DC
Start: 2021-05-04 — End: 2021-07-29
  Filled 2021-05-04: qty 120, 90d supply, fill #0

## 2021-05-04 NOTE — Telephone Encounter (Signed)
Next Visit: 07/12/2021  Last Visit: 11/30/2020  Labs: 04/16/2021 Sed rate (inflammation test) is mildly elevated and is stable.  Complements are normal which do not indicate active disease.  CBC and UA are normal.  Creatinine is elevated at 1.24.  04/23/2021 Creatinine is normal.  Eye exam: 03/23/2020   Current Dose per office note on 11/30/2020: PLQ 200 mg BID M-F.   DX: Autoimmune disease  Last Fill: 12/11/2020  Attempted to contact patient and left message on machine advising patient we need updated PLQ eye exam.   Okay to refill Plaquenil?

## 2021-05-06 ENCOUNTER — Other Ambulatory Visit (HOSPITAL_COMMUNITY): Payer: Self-pay

## 2021-05-08 ENCOUNTER — Other Ambulatory Visit (HOSPITAL_COMMUNITY): Payer: Self-pay

## 2021-05-09 ENCOUNTER — Other Ambulatory Visit (HOSPITAL_COMMUNITY): Payer: Self-pay

## 2021-06-07 ENCOUNTER — Other Ambulatory Visit (HOSPITAL_COMMUNITY): Payer: Self-pay

## 2021-06-07 MED ORDER — DULOXETINE HCL 60 MG PO CPEP
ORAL_CAPSULE | ORAL | 1 refills | Status: DC
  Filled 2021-06-07: qty 180, 90d supply, fill #0

## 2021-06-07 MED ORDER — VYVANSE 40 MG PO CAPS: ORAL_CAPSULE | ORAL | 0 refills | Status: DC

## 2021-06-07 MED ORDER — VYVANSE 40 MG PO CAPS
ORAL_CAPSULE | ORAL | 0 refills | Status: DC
  Filled 2021-06-07: qty 30, 30d supply, fill #0

## 2021-06-21 ENCOUNTER — Other Ambulatory Visit (HOSPITAL_COMMUNITY): Payer: Self-pay

## 2021-06-28 NOTE — Progress Notes (Signed)
Office Visit Note  Patient: Lindsay Wong             Date of Birth: 19-Feb-1981           MRN: [id number]             PCP: Caren Macadam, MD Referring: Caren Macadam, MD Visit Date: 07/12/2021 Occupation: @GUAROCC @  Subjective:  Medication management  History of Present Illness: Lindsay Wong is a 41 y.o. female with history of autoimmune disease with this disease.  She states she went jogging for about 3 miles last week.  Over the weekend she started experiencing increased pain and discomfort all over.  She was having muscle cramps and muscle jerking.  She decided to come off Cymbalta.  She was evaluated by her psychiatrist and was advised to stay on Cymbalta 60 mg a day.  She has flares of Raynauds with temperature change.  There is no history of oral ulcers, nasal ulcers, malar rash, photosensitivity or lymphadenopathy.  She developed pain in her knee joints few days back which has resolved now.  Activities of Daily Living:  Patient reports morning stiffness for 1 hour.   Patient Reports nocturnal pain.  Difficulty dressing/grooming: Denies Difficulty climbing stairs: Denies Difficulty getting out of chair: Denies Difficulty using hands for taps, buttons, cutlery, and/or writing: Denies  Review of Systems  Constitutional:  Negative for fatigue.  HENT:  Positive for nose dryness. Negative for mouth sores and mouth dryness.   Eyes:  Negative for pain, itching and dryness.  Respiratory:  Negative for shortness of breath and difficulty breathing.   Cardiovascular:  Positive for palpitations and hypertension. Negative for chest pain.  Gastrointestinal:  Negative for blood in stool, constipation and diarrhea.  Endocrine: Positive for increased urination.  Genitourinary:  Negative for difficulty urinating.  Musculoskeletal:  Positive for morning stiffness and muscle tenderness. Negative for joint pain, joint pain, joint swelling, myalgias and myalgias.  Skin:  Positive  for color change and rash. Negative for sensitivity to sunlight.  Allergic/Immunologic: Negative for susceptible to infections.  Neurological:  Positive for dizziness, numbness and headaches. Negative for memory loss and weakness.  Hematological:  Positive for bruising/bleeding tendency.  Psychiatric/Behavioral:  Positive for sleep disturbance. Negative for depressed mood and confusion. The patient is not nervous/anxious.    PMFS History:  Patient Active Problem List   Diagnosis Date Noted   Foraminal stenosis of cervical region 07/04/2020   Adverse effect of other viral vaccines, subsequent encounter 01/27/2020   Spinal stenosis of cervical region 12/28/2019   Fatigue 08/20/2019   Anxiety and depression 05/19/2017   Left shoulder pain 07/19/2014   RAYNAUD'S DISEASE 01/31/2007    Past Medical History:  Diagnosis Date   Allergy    Anxiety and depression    per patient diagnosed by psychiatrist   Appendicitis 07/20/2012   Blood in stool    Chicken pox    Depression    postpartum with one child   Fatty liver    GERD 01/31/2007   Qualifier: Diagnosis of  By: Marca Ancona RMA, [name]     Hyperlipidemia    postpartum   PONV (postoperative nausea and vomiting)    Positive ANA (antinuclear antibody)    RAYNAUD'S DISEASE 01/31/2007   Qualifier: Diagnosis of  By: Marca Ancona RMA, [name]     Urticaria     Family History  Problem Relation Age of Onset   Early menopause Mother    Other Father  no contact since infant   Raynaud syndrome Sister    Cancer Maternal Grandmother        melanoma   Stroke Maternal Grandmother 69   Arthritis Maternal Grandmother    Hyperlipidemia Maternal Grandmother    Hypertension Maternal Grandmother    Breast cancer Maternal Grandmother 72   Cancer Maternal Grandfather        bladder   Bladder Cancer Maternal Grandfather    Lung cancer Other        all but one of grandmother's siblings    Lupus Maternal Uncle    Leukemia Maternal Uncle    Multiple sclerosis  Cousin    Autism Son    Healthy Son    Healthy Son    Healthy Son    Immunodeficiency Maternal Aunt    Heart attack Maternal Aunt 49   CAD Maternal Aunt    Colon cancer Neg Hx    Esophageal cancer Neg Hx    Stomach cancer Neg Hx    Rectal cancer Neg Hx    Past Surgical History:  Procedure Laterality Date   ANTERIOR FUSION CERVICAL SPINE  07/2020   CESAREAN SECTION     x3   COLONOSCOPY  01/2020   LAPAROSCOPIC APPENDECTOMY Right 07/20/2012   Procedure: APPENDECTOMY LAPAROSCOPIC;  Surgeon: Earnstine Regal, MD;  Location: [facility];  Service: General;  Laterality: Right;   WISDOM TOOTH EXTRACTION     Social History   Social History Narrative   Work or School: homemaker, 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,7 and 3 (2014)      Home Situation: lives with husband and children      Spiritual Beliefs: none      Lifestyle: hot yoga, weights and cardio, tries to eat a healthy diet      Lives in a 2 story home      Right handed         Immunization History  Administered Date(s) Administered   Influenza Split 01/04/2011   Influenza Whole 04/07/2006, 02/01/2008   Influenza,inj,Quad PF,6+ Mos 01/14/2014, 02/08/2015, 02/20/2016, 02/12/2017, 02/13/2018, 03/01/2019, 03/05/2021   Influenza-Unspecified 01/11/2013, 02/11/2016, 03/13/2020   Janssen (J&J) SARS-COV-2 Vaccination 01/28/2020, 04/29/2020   Tdap 03/01/2019     Objective: Vital Signs: BP (!) 160/107 (BP Location: Right Arm, Patient Position: Sitting, Cuff Size: Normal)    Pulse 93    Ht 5\' 3"  (1.6 m)    Wt 168 lb 6.4 oz (76.4 kg)    BMI 29.83 kg/m    Physical Exam Vitals and nursing note reviewed.  Constitutional:      Appearance: She is well-developed.  HENT:     Head: Normocephalic and atraumatic.  Eyes:     Conjunctiva/sclera: Conjunctivae normal.  Cardiovascular:     Rate and Rhythm: Normal rate and regular rhythm.     Heart sounds: Normal heart sounds.  Pulmonary:     Effort: Pulmonary effort is normal.     Breath sounds: Normal breath  sounds.  Abdominal:     General: Bowel sounds are normal.     Palpations: Abdomen is soft.  Musculoskeletal:     Cervical back: Normal range of motion.  Lymphadenopathy:     Cervical: No cervical adenopathy.  Skin:    General: Skin is warm and dry.     Capillary Refill: Capillary refill takes less than 2 seconds.  Neurological:     Mental Status: She is alert and oriented to person, place, and time.  Psychiatric:  Behavior: Behavior normal.     Musculoskeletal Exam: C-spine thoracic and lumbar spine with good range of motion.  Shoulder joints, elbow joints, wrist joints, MCPs PIPs and DIPs with good range of motion with no synovitis.  Hip joints, knee joints, ankles, MTPs and PIPs with good range of motion with no synovitis.  CDAI Exam: CDAI Score: -- Patient Global: --; Provider Global: -- Swollen: --; Tender: -- Joint Exam 07/12/2021   No joint exam has been documented for this visit   There is currently no information documented on the homunculus. Go to the Rheumatology activity and complete the homunculus joint exam.  Investigation: No additional findings.  Imaging: No results found.  Recent Labs: Lab Results  Component Value Date   WBC 7.7 07/10/2021   HGB 13.5 07/10/2021   PLT 392 07/10/2021   NA 138 07/10/2021   K 3.9 07/10/2021   CL 103 07/10/2021   CO2 25 07/10/2021   GLUCOSE 124 (H) 07/10/2021   BUN 11 07/10/2021   CREATININE 0.94 07/10/2021   BILITOT 0.4 07/10/2021   ALKPHOS 76 06/30/2020   AST 14 07/10/2021   ALT 15 07/10/2021   PROT 7.5 07/10/2021   ALBUMIN 4.3 06/30/2020   CALCIUM 9.2 07/10/2021   GFRAA 119 06/19/2020    Speciality Comments: PLQ Eye Exam: 03/23/2020 WNL @ [facility] Follow up in 1 year  Procedures:  No procedures performed Allergies: Clarithromycin, Peg-2000 (covid-19 mrna vaccine component), and Latex   Assessment / Plan:     Visit Diagnoses: Autoimmune disease ([place]) - ANA 1: 640 speckled pattern, ENA  negative, C3-C4 normal, anticardiolipin, beta-2, LA-,  History of fatigue,oral ulcers, arthralgias, photosensitivity, sicca symptoms.  She was seen today on urgent basis that she started experiencing increased pain and discomfort after running last weekend.  She was also experiencing increased muscle cramps.  She states over the last few days the symptoms have resolved.  She continues to have photosensitivity, Raynaud's phenomenon and sicca symptoms.  Labs obtained on July 10, 2021 showed protein creatinine ratio was normal, complements were normal, sed rate 22 which was mildly elevated, CK was normal and TSH was normal.  Double-stranded DNA was negative.  Patient had no synovitis on examination today.  Labs did not indicate autoimmune disease flare.  Left findings were discussed with the patient.  High risk medication use - PLQ 200 mg BID M-F. PLQ Eye Exam: 03/23/2020.  We will continue to monitor labs every 5 months.  We will continue to monitor labs.  Raynaud's syndrome without gangrene-renal symptoms are still intermittently active.  Keeping core temperature warm and warm clothing was discussed.  She is on Norvasc 5 mg p.o. daily.  Primary osteoarthritis of both knees - Moderate OA.  She started having increased knee pain after jogging.  The pain symptoms have resolved now.  DDD (degenerative disc disease), cervical - S/P fusion 03/22 by Dr.Cram .  She had good range of motion in her cervical spine.  Rash-she gets intermittent rash.  Other fatigue-she continues to have some fatigue.  Hypermobility of joint-she has hypermobility in multiple joints.  Isometric exercises were advised.  Elevated blood pressure reading-her blood pressure was again elevated today.  I have advised her to contact her PCP to start on antihypertensive medications.  Other medical problems are listed as follows:  History of esophageal reflux  Pre-eclampsia in third trimester  History of migraine  Anxiety and  depression-she is on Cymbalta and followed by the psychiatrist.  Family history of multiple  sclerosis  Orders: No orders of the defined types were placed in this encounter.  No orders of the defined types were placed in this encounter.    Follow-Up Instructions: Return in about 5 months (around 12/12/2021) for Autoimmune disease.   Bo Merino, MD  Note - This record has been created using Editor, commissioning.  Chart creation errors have been sought, but may not always  have been located. Such creation errors do not reflect on  the standard of medical care.

## 2021-07-09 ENCOUNTER — Encounter: Payer: Self-pay | Admitting: Rheumatology

## 2021-07-09 ENCOUNTER — Other Ambulatory Visit (HOSPITAL_COMMUNITY): Payer: Self-pay

## 2021-07-09 ENCOUNTER — Other Ambulatory Visit: Payer: Self-pay | Admitting: Rheumatology

## 2021-07-09 ENCOUNTER — Other Ambulatory Visit: Payer: Self-pay | Admitting: Physician Assistant

## 2021-07-09 ENCOUNTER — Encounter: Payer: Self-pay | Admitting: Family Medicine

## 2021-07-09 DIAGNOSIS — M359 Systemic involvement of connective tissue, unspecified: Secondary | ICD-10-CM

## 2021-07-09 DIAGNOSIS — R252 Cramp and spasm: Secondary | ICD-10-CM

## 2021-07-09 MED ORDER — AMLODIPINE BESYLATE 5 MG PO TABS
ORAL_TABLET | Freq: Every day | ORAL | 0 refills | Status: DC
  Filled 2021-07-09: qty 90, 90d supply, fill #0

## 2021-07-09 NOTE — Telephone Encounter (Signed)
Please forward message to Dr. Corliss Skains to review.

## 2021-07-09 NOTE — Telephone Encounter (Signed)
Next Visit: 07/12/2021   Last Visit: 11/30/2020  Last Fill: 03/22/2021   DX: Raynaud's syndrome without gangrene   Current Dose per office note on 11/30/2020: dose not specified.    Okay to refill amlodipine?

## 2021-07-10 ENCOUNTER — Other Ambulatory Visit: Payer: Self-pay | Admitting: *Deleted

## 2021-07-10 DIAGNOSIS — R252 Cramp and spasm: Secondary | ICD-10-CM

## 2021-07-10 DIAGNOSIS — M359 Systemic involvement of connective tissue, unspecified: Secondary | ICD-10-CM

## 2021-07-11 ENCOUNTER — Ambulatory Visit: Payer: No Typology Code available for payment source | Admitting: Family Medicine

## 2021-07-11 LAB — COMPLETE METABOLIC PANEL WITH GFR
AG Ratio: 1.3 (calc) (ref 1.0–2.5)
ALT: 15 U/L (ref 6–29)
AST: 14 U/L (ref 10–30)
Albumin: 4.2 g/dL (ref 3.6–5.1)
Alkaline phosphatase (APISO): 88 U/L (ref 31–125)
BUN: 11 mg/dL (ref 7–25)
CO2: 25 mmol/L (ref 20–32)
Calcium: 9.2 mg/dL (ref 8.6–10.2)
Chloride: 103 mmol/L (ref 98–110)
Creat: 0.94 mg/dL (ref 0.50–0.99)
Globulin: 3.3 g/dL (calc) (ref 1.9–3.7)
Glucose, Bld: 124 mg/dL — ABNORMAL HIGH (ref 65–99)
Potassium: 3.9 mmol/L (ref 3.5–5.3)
Sodium: 138 mmol/L (ref 135–146)
Total Bilirubin: 0.4 mg/dL (ref 0.2–1.2)
Total Protein: 7.5 g/dL (ref 6.1–8.1)
eGFR: 78 mL/min/{1.73_m2} (ref >=60)

## 2021-07-11 LAB — CBC WITH DIFFERENTIAL/PLATELET
Absolute Monocytes: 454 cells/uL (ref 200–950)
Basophils Absolute: 69 cells/uL (ref 0–200)
Basophils Relative: 0.9 %
Eosinophils Absolute: 223 cells/uL (ref 15–500)
Eosinophils Relative: 2.9 %
HCT: 41.6 % (ref 35.0–45.0)
Hemoglobin: 13.5 g/dL (ref 11.7–15.5)
Lymphs Abs: 2133 cells/uL (ref 850–3900)
MCH: 26.9 pg — ABNORMAL LOW (ref 27.0–33.0)
MCHC: 32.5 g/dL (ref 32.0–36.0)
MCV: 83 fL (ref 80.0–100.0)
MPV: 10.4 fL (ref 7.5–12.5)
Monocytes Relative: 5.9 %
Neutro Abs: 4820 cells/uL (ref 1500–7800)
Neutrophils Relative %: 62.6 %
Platelets: 392 10*3/uL (ref 140–400)
RBC: 5.01 10*6/uL (ref 3.80–5.10)
RDW: 13.8 % (ref 11.0–15.0)
Total Lymphocyte: 27.7 %
WBC: 7.7 10*3/uL (ref 3.8–10.8)

## 2021-07-11 LAB — CK: Total CK: 101 U/L (ref 29–143)

## 2021-07-11 LAB — C3 AND C4
C3 Complement: 203 mg/dL — ABNORMAL HIGH (ref 83–193)
C4 Complement: 37 mg/dL (ref 15–57)

## 2021-07-11 LAB — TSH: TSH: 2.5 mIU/L

## 2021-07-11 LAB — PROTEIN / CREATININE RATIO, URINE
Creatinine, Urine: 94 mg/dL (ref 20–275)
Protein/Creat Ratio: 160 mg/g creat (ref 24–184)
Protein/Creatinine Ratio: 0.16 mg/mg creat (ref 0.024–0.184)
Total Protein, Urine: 15 mg/dL (ref 5–24)

## 2021-07-11 LAB — ANTI-DNA ANTIBODY, DOUBLE-STRANDED: ds DNA Ab: <1 IU/mL

## 2021-07-11 LAB — SEDIMENTATION RATE: Sed Rate: 22 mm/h — ABNORMAL HIGH (ref 0–20)

## 2021-07-11 NOTE — Progress Notes (Deleted)
NO SHOW

## 2021-07-11 NOTE — Progress Notes (Signed)
I will discuss results at the follow-up visit.

## 2021-07-12 ENCOUNTER — Other Ambulatory Visit: Payer: Self-pay

## 2021-07-12 ENCOUNTER — Encounter: Payer: Self-pay | Admitting: Rheumatology

## 2021-07-12 ENCOUNTER — Encounter: Payer: Self-pay | Admitting: Family Medicine

## 2021-07-12 ENCOUNTER — Ambulatory Visit: Payer: No Typology Code available for payment source | Admitting: Rheumatology

## 2021-07-12 VITALS — BP 160/107 | HR 93 | Ht 63.0 in | Wt 168.4 lb

## 2021-07-12 DIAGNOSIS — R21 Rash and other nonspecific skin eruption: Secondary | ICD-10-CM

## 2021-07-12 DIAGNOSIS — Z79899 Other long term (current) drug therapy: Secondary | ICD-10-CM

## 2021-07-12 DIAGNOSIS — O1493 Unspecified pre-eclampsia, third trimester: Secondary | ICD-10-CM

## 2021-07-12 DIAGNOSIS — M5030 Other cervical disc degeneration, unspecified cervical region: Secondary | ICD-10-CM

## 2021-07-12 DIAGNOSIS — M249 Joint derangement, unspecified: Secondary | ICD-10-CM

## 2021-07-12 DIAGNOSIS — R030 Elevated blood-pressure reading, without diagnosis of hypertension: Secondary | ICD-10-CM

## 2021-07-12 DIAGNOSIS — M359 Systemic involvement of connective tissue, unspecified: Secondary | ICD-10-CM | POA: Diagnosis not present

## 2021-07-12 DIAGNOSIS — Z8719 Personal history of other diseases of the digestive system: Secondary | ICD-10-CM

## 2021-07-12 DIAGNOSIS — M170 Bilateral primary osteoarthritis of knee: Secondary | ICD-10-CM | POA: Diagnosis not present

## 2021-07-12 DIAGNOSIS — F419 Anxiety disorder, unspecified: Secondary | ICD-10-CM

## 2021-07-12 DIAGNOSIS — Z820 Family history of epilepsy and other diseases of the nervous system: Secondary | ICD-10-CM

## 2021-07-12 DIAGNOSIS — I7300 Raynaud's syndrome without gangrene: Secondary | ICD-10-CM | POA: Diagnosis not present

## 2021-07-12 DIAGNOSIS — Z8669 Personal history of other diseases of the nervous system and sense organs: Secondary | ICD-10-CM

## 2021-07-12 DIAGNOSIS — R5383 Other fatigue: Secondary | ICD-10-CM

## 2021-07-12 DIAGNOSIS — F32A Depression, unspecified: Secondary | ICD-10-CM

## 2021-07-12 NOTE — Patient Instructions (Signed)
DASH Eating Plan °DASH stands for Dietary Approaches to Stop Hypertension. The DASH eating plan is a healthy eating plan that has been shown to: °Reduce high blood pressure (hypertension). °Reduce your risk for type 2 diabetes, heart disease, and stroke. °Help with weight loss. °What are tips for following this plan? °Reading food labels °Check food labels for the amount of salt (sodium) per serving. Choose foods with less than 5 percent of the Daily Value of sodium. Generally, foods with less than 300 milligrams (mg) of sodium per serving fit into this eating plan. °To find whole grains, look for the word "whole" as the first word in the ingredient list. °Shopping °Buy products labeled as "low-sodium" or "no salt added." °Buy fresh foods. Avoid canned foods and pre-made or frozen meals. °Cooking °Avoid adding salt when cooking. Use salt-free seasonings or herbs instead of table salt or sea salt. Check with your health care provider or pharmacist before using salt substitutes. °Do not fry foods. Cook foods using healthy methods such as baking, boiling, grilling, roasting, and broiling instead. °Cook with heart-healthy oils, such as olive, canola, avocado, soybean, or sunflower oil. °Meal planning ° °Eat a balanced diet that includes: °4 or more servings of fruits and 4 or more servings of vegetables each day. Try to fill one-half of your plate with fruits and vegetables. °6-8 servings of whole grains each day. °Less than 6 oz (170 g) of lean meat, poultry, or fish each day. A 3-oz (85-g) serving of meat is about the same size as a deck of cards. One egg equals 1 oz (28 g). °2-3 servings of low-fat dairy each day. One serving is 1 cup (237 mL). °1 serving of nuts, seeds, or beans 5 times each week. °2-3 servings of heart-healthy fats. Healthy fats called omega-3 fatty acids are found in foods such as walnuts, flaxseeds, fortified milks, and eggs. These fats are also found in cold-water fish, such as sardines, salmon,  and mackerel. °Limit how much you eat of: °Canned or prepackaged foods. °Food that is high in trans fat, such as some fried foods. °Food that is high in saturated fat, such as fatty meat. °Desserts and other sweets, sugary drinks, and other foods with added sugar. °Full-fat dairy products. °Do not salt foods before eating. °Do not eat more than 4 egg yolks a week. °Try to eat at least 2 vegetarian meals a week. °Eat more home-cooked food and less restaurant, buffet, and fast food. °Lifestyle °When eating at a restaurant, ask that your food be prepared with less salt or no salt, if possible. °If you drink alcohol: °Limit how much you use to: °0-1 drink a day for women who are not pregnant. °0-2 drinks a day for men. °Be aware of how much alcohol is in your drink. In the [place] one drink equals one 12 oz bottle of beer (355 mL), one 5 oz glass of wine (148 mL), or one 1½ oz glass of hard liquor (44 mL). °General information °Avoid eating more than 2,300 mg of salt a day. If you have hypertension, you may need to reduce your sodium intake to 1,500 mg a day. °Work with your health care provider to maintain a healthy body weight or to lose weight. Ask what an ideal weight is for you. °Get at least 30 minutes of exercise that causes your heart to beat faster (aerobic exercise) most days of the week. Activities may include walking, swimming, or biking. °Work with your health care provider or dietitian to   adjust your eating plan to your individual calorie needs. °What foods should I eat? °Fruits °All fresh, dried, or frozen fruit. Canned fruit in natural juice (without added sugar). °Vegetables °Fresh or frozen vegetables (raw, steamed, roasted, or grilled). Low-sodium or reduced-sodium tomato and vegetable juice. Low-sodium or reduced-sodium tomato sauce and tomato paste. Low-sodium or reduced-sodium canned vegetables. °Grains °Whole-grain or whole-wheat bread. Whole-grain or whole-wheat pasta. Brown rice. Oatmeal. Quinoa.  Bulgur. Whole-grain and low-sodium cereals. Pita bread. Low-fat, low-sodium crackers. Whole-wheat flour tortillas. °Meats and other proteins °Skinless chicken or turkey. Ground chicken or turkey. Pork with fat trimmed off. Fish and seafood. Egg whites. Dried beans, peas, or lentils. Unsalted nuts, nut butters, and seeds. Unsalted canned beans. Lean cuts of beef with fat trimmed off. Low-sodium, lean precooked or cured meat, such as sausages or meat loaves. °Dairy °Low-fat (1%) or fat-free (skim) milk. Reduced-fat, low-fat, or fat-free cheeses. Nonfat, low-sodium ricotta or cottage cheese. Low-fat or nonfat yogurt. Low-fat, low-sodium cheese. °Fats and oils °Soft margarine without trans fats. Vegetable oil. Reduced-fat, low-fat, or light mayonnaise and salad dressings (reduced-sodium). Canola, safflower, olive, avocado, soybean, and sunflower oils. Avocado. °Seasonings and condiments °Herbs. Spices. Seasoning mixes without salt. °Other foods °Unsalted popcorn and pretzels. Fat-free sweets. °The items listed above may not be a complete list of foods and beverages you can eat. Contact a dietitian for more information. °What foods should I avoid? °Fruits °Canned fruit in a light or heavy syrup. Fried fruit. Fruit in cream or butter sauce. °Vegetables °Creamed or fried vegetables. Vegetables in a cheese sauce. Regular canned vegetables (not low-sodium or reduced-sodium). Regular canned tomato sauce and paste (not low-sodium or reduced-sodium). Regular tomato and vegetable juice (not low-sodium or reduced-sodium). Pickles. Olives. °Grains °Baked goods made with fat, such as croissants, muffins, or some breads. Dry pasta or rice meal packs. °Meats and other proteins °Fatty cuts of meat. Ribs. Fried meat. Bacon. Bologna, salami, and other precooked or cured meats, such as sausages or meat loaves. Fat from the back of a pig (fatback). Bratwurst. Salted nuts and seeds. Canned beans with added salt. Canned or smoked fish.  Whole eggs or egg yolks. Chicken or turkey with skin. °Dairy °Whole or 2% milk, cream, and half-and-half. Whole or full-fat cream cheese. Whole-fat or sweetened yogurt. Full-fat cheese. Nondairy creamers. Whipped toppings. Processed cheese and cheese spreads. °Fats and oils °Butter. Stick margarine. Lard. Shortening. Ghee. Bacon fat. Tropical oils, such as coconut, palm kernel, or palm oil. °Seasonings and condiments °Onion salt, garlic salt, seasoned salt, table salt, and sea salt. Worcestershire sauce. Tartar sauce. Barbecue sauce. Teriyaki sauce. Soy sauce, including reduced-sodium. Steak sauce. Canned and packaged gravies. Fish sauce. Oyster sauce. Cocktail sauce. Store-bought horseradish. Ketchup. Mustard. Meat flavorings and tenderizers. Bouillon cubes. Hot sauces. Pre-made or packaged marinades. Pre-made or packaged taco seasonings. Relishes. Regular salad dressings. °Other foods °Salted popcorn and pretzels. °The items listed above may not be a complete list of foods and beverages you should avoid. Contact a dietitian for more information. °Where to find more information °National Heart, Lung, and Blood Institute: www.nhlbi.nih.gov °American Heart Association: www.heart.org °Academy of Nutrition and Dietetics: www.eatright.org °National Kidney Foundation: www.kidney.org °Summary °The DASH eating plan is a healthy eating plan that has been shown to reduce high blood pressure (hypertension). It may also reduce your risk for type 2 diabetes, heart disease, and stroke. °When on the DASH eating plan, aim to eat more fresh fruits and vegetables, whole grains, lean proteins, low-fat dairy, and heart-healthy fats. °With the DASH   eating plan, you should limit salt (sodium) intake to 2,300 mg a day. If you have hypertension, you may need to reduce your sodium intake to 1,500 mg a day. °Work with your health care provider or dietitian to adjust your eating plan to your individual calorie needs. °This information is not  intended to replace advice given to you by your health care provider. Make sure you discuss any questions you have with your health care provider. °Document Revised: 04/02/2019 Document Reviewed: 04/02/2019 °Elsevier Patient Education © 2022 Elsevier Inc. ° °

## 2021-07-13 ENCOUNTER — Encounter: Payer: Self-pay | Admitting: Family Medicine

## 2021-07-13 ENCOUNTER — Ambulatory Visit (INDEPENDENT_AMBULATORY_CARE_PROVIDER_SITE_OTHER): Payer: No Typology Code available for payment source | Admitting: Family Medicine

## 2021-07-13 ENCOUNTER — Other Ambulatory Visit (HOSPITAL_COMMUNITY): Payer: Self-pay

## 2021-07-13 DIAGNOSIS — I10 Essential (primary) hypertension: Secondary | ICD-10-CM | POA: Diagnosis not present

## 2021-07-13 HISTORY — DX: Essential (primary) hypertension: I10

## 2021-07-13 MED ORDER — LOSARTAN POTASSIUM 50 MG PO TABS
50.0000 mg | ORAL_TABLET | Freq: Every day | ORAL | 5 refills | Status: DC
  Filled 2021-07-13: qty 30, 30d supply, fill #0

## 2021-07-13 NOTE — Progress Notes (Signed)
Established Patient Office Visit  Subjective:  Patient ID: Lindsay Wong, female    DOB: Feb 07, 1981  Age: 41 y.o. MRN: [id number]  CC:  Chief Complaint  Patient presents with   Hypertension    Ongoing, having headaches.     HPI Lindsay Wong presents for concerns for elevated blood pressure.  She is on amlodipine but has really taken this more for Raynaud's in the past.  She has had several recent home readings up as high as 812X to 517 systolic and around 001 diastolic.  She does take amlodipine 5 mg daily and is compliant with that.  No recent dietary changes.  No regular alcohol.  She has a tendency toward some mild peripheral edema.  She had visit with her rheumatologist yesterday and had reading 160/107.  Her other medical problems include history of cervical spine stenosis and history of depression.  She states she is currently tapering off Cymbalta.  She is on oral contraception but is using this more to regulate periods.  Her husband's had a vasectomy.  Past Medical History:  Diagnosis Date   Allergy    Anxiety and depression    per patient diagnosed by psychiatrist   Appendicitis 07/20/2012   Blood in stool    Chicken pox    Depression    postpartum with one child   Fatty liver    GERD 01/31/2007   Qualifier: Diagnosis of  By: Marca Ancona RMA, [name]     Hyperlipidemia    postpartum   PONV (postoperative nausea and vomiting)    Positive ANA (antinuclear antibody)    RAYNAUD'S DISEASE 01/31/2007   Qualifier: Diagnosis of  By: Reatha Armour, [name]     Urticaria     Past Surgical History:  Procedure Laterality Date   ANTERIOR FUSION CERVICAL SPINE  07/2020   CESAREAN SECTION     x3   COLONOSCOPY  01/2020   LAPAROSCOPIC APPENDECTOMY Right 07/20/2012   Procedure: APPENDECTOMY LAPAROSCOPIC;  Surgeon: Earnstine Regal, MD;  Location: [facility];  Service: General;  Laterality: Right;   WISDOM TOOTH EXTRACTION      Family History  Problem Relation Age of Onset   Early menopause  Mother    Other Father        no contact since infant   Raynaud syndrome Sister    Cancer Maternal Grandmother        melanoma   Stroke Maternal Grandmother 91   Arthritis Maternal Grandmother    Hyperlipidemia Maternal Grandmother    Hypertension Maternal Grandmother    Breast cancer Maternal Grandmother 72   Cancer Maternal Grandfather        bladder   Bladder Cancer Maternal Grandfather    Lung cancer Other        all but one of grandmother's siblings    Lupus Maternal Uncle    Leukemia Maternal Uncle    Multiple sclerosis Cousin    Autism Son    Healthy Son    Healthy Son    Healthy Son    Immunodeficiency Maternal Aunt    Heart attack Maternal Aunt 49   CAD Maternal Aunt    Colon cancer Neg Hx    Esophageal cancer Neg Hx    Stomach cancer Neg Hx    Rectal cancer Neg Hx     Social History   Socioeconomic History   Marital status: Married    Spouse name: Not on file   Number of children: 3   Years of  education: Not on file   Highest education level: Bachelor's degree (e.g., BA, AB, BS)  Occupational History   Not on file  Tobacco Use   Smoking status: Never    Passive exposure: Past   Smokeless tobacco: Never  Vaping Use   Vaping Use: Never used  Substance and Sexual Activity   Alcohol use: No   Drug use: No   Sexual activity: Yes    Birth control/protection: None    Comment: husband had vasectomy  Other Topics Concern   Not on file  Social History Narrative   Work or School: homemaker, 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,7 and 3 (2014)      Home Situation: lives with husband and children      Spiritual Beliefs: none      Lifestyle: hot yoga, weights and cardio, tries to eat a healthy diet      Lives in a 2 story home      Right handed         Social Determinants of Health   Financial Resource Strain: Low Risk    Difficulty of Paying Living Expenses: Not hard at all  Food Insecurity: No Food Insecurity   Worried About Charity fundraiser in the Last Year: Never  true   [place] in the Last Year: Never true  Transportation Needs: No Transportation Needs   Lack of Transportation (Medical): No   Lack of Transportation (Non-Medical): No  Physical Activity: Insufficiently Active   Days of Exercise per Week: 3 days   Minutes of Exercise per Session: 40 min  Stress: No Stress Concern Present   Feeling of Stress : Only a little  Social Connections: Moderately Integrated   Frequency of Communication with Friends and Family: More than three times a week   Frequency of Social Gatherings with Friends and Family: Once a week   Attends Religious Services: More than 4 times per year   Active Member of [employer] or Organizations: No   Attends Music therapist: Not on file   Marital Status: Married  Human resources officer Violence: Not on file    Outpatient Medications Prior to Visit  Medication Sig Dispense Refill   amLODipine (NORVASC) 5 MG tablet TAKE 1 TABLET BY MOUTH DAILY. 90 tablet 0   DULoxetine (CYMBALTA) 60 MG capsule Take one capsule by mouth twice daily (Patient taking differently: Take 60 mg by mouth daily.) 180 capsule 1   esomeprazole (NEXIUM) 40 MG capsule TAKE 1 CAPSULE BY MOUTH ONCE A DAY 30 capsule 3   Ferrous Sulfate (IRON PO) Take by mouth 3 (three) times a week.     hydroxychloroquine (PLAQUENIL) 200 MG tablet TAKE 1 TABLET BY MOUTH 2 TIMES DAILY ON MONDAY THROUGH FRIDAY 120 tablet 0   levocetirizine (XYZAL) 5 MG tablet TAKE 1 TABLET BY MOUTH EVERY EVENING AS NEEDED FOR ITCHING (Patient taking differently: Take 5 mg by mouth 2 (two) times daily.) 30 tablet 0   Norethindrone-Ethinyl Estradiol-Fe Biphas (LO LOESTRIN FE) 1 MG-10 MCG / 10 MCG tablet Take 1 tablet every day by mouth. 84 tablet 4   VITAMIN D PO Take 2,000 Units by mouth daily.      Facility-Administered Medications Prior to Visit  Medication Dose Route Frequency Provider Last Rate Last Admin   0.9 %  sodium chloride infusion  500 mL Intravenous Once Pyrtle, Lajuan Lines,  MD        Allergies  Allergen Reactions   Clarithromycin Shortness Of Breath  REACTION: hives   Peg-2000 (Covid-19 Mrna Vaccine Component) Anaphylaxis   Latex     Skin irritation from gloves   Wt Readings from Last 3 Encounters:  07/13/21 168 lb (76.2 kg)  07/12/21 168 lb 6.4 oz (76.4 kg)  11/30/20 161 lb 6.4 oz (73.2 kg)     ROS Review of Systems  Constitutional:  Negative for fatigue.  Eyes:  Negative for visual disturbance.  Respiratory:  Negative for cough, chest tightness, shortness of breath and wheezing.   Cardiovascular:  Negative for chest pain, palpitations and leg swelling.  Neurological:  Positive for headaches. Negative for dizziness, seizures, syncope, weakness and light-headedness.     Objective:    Physical Exam Vitals reviewed.  Constitutional:      Appearance: Normal appearance.  Cardiovascular:     Rate and Rhythm: Normal rate and regular rhythm.  Pulmonary:     Effort: Pulmonary effort is normal.     Breath sounds: Normal breath sounds.  Musculoskeletal:     Cervical back: Neck supple.  Lymphadenopathy:     Cervical: No cervical adenopathy.  Neurological:     Mental Status: She is alert.    BP (!) 148/108 (BP Location: Right Arm, Cuff Size: Normal)    Pulse 97    Temp 98.5 F (36.9 C) (Oral)    Ht 5' 3"  (1.6 m)    Wt 168 lb (76.2 kg)    SpO2 97%    BMI 29.76 kg/m  Wt Readings from Last 3 Encounters:  07/13/21 168 lb (76.2 kg)  07/12/21 168 lb 6.4 oz (76.4 kg)  11/30/20 161 lb 6.4 oz (73.2 kg)     Health Maintenance Due  Topic Date Due   COVID-19 Vaccine (3 - Booster for Janssen series) 06/24/2020    There are no preventive care reminders to display for this patient.  Lab Results  Component Value Date   TSH 2.50 07/10/2021   Lab Results  Component Value Date   WBC 7.7 07/10/2021   HGB 13.5 07/10/2021   HCT 41.6 07/10/2021   MCV 83.0 07/10/2021   PLT 392 07/10/2021   Lab Results  Component Value Date   NA 138 07/10/2021    K 3.9 07/10/2021   CO2 25 07/10/2021   GLUCOSE 124 (H) 07/10/2021   BUN 11 07/10/2021   CREATININE 0.94 07/10/2021   BILITOT 0.4 07/10/2021   ALKPHOS 76 06/30/2020   AST 14 07/10/2021   ALT 15 07/10/2021   PROT 7.5 07/10/2021   ALBUMIN 4.3 06/30/2020   CALCIUM 9.2 07/10/2021   EGFR 78 07/10/2021   GFR 86.02 06/30/2020   Lab Results  Component Value Date   CHOL 240 (H) 11/29/2020   Lab Results  Component Value Date   HDL 70.20 11/29/2020   Lab Results  Component Value Date   LDLCALC 152 (H) 11/29/2020   Lab Results  Component Value Date   TRIG 89.0 11/29/2020   Lab Results  Component Value Date   CHOLHDL 3 11/29/2020   Lab Results  Component Value Date   HGBA1C 5.2 04/17/2020      Assessment & Plan:   Hypertension.  Patient's had multiple elevated readings.  Already on amlodipine for Raynaud's.  -We discussed nonpharmacologic factors in managing high blood pressure at some length including importance of weight loss, regular aerobic exercise, keeping sodium intake less than 2500 mg daily. -Continue amlodipine 5 mg daily.  Would probably not increase amlodipine with her tendencies toward edema -Add losartan 50 mg once  daily and recommend follow-up with primary within 1 month to reassess.  Consider further titration of losartan at that time if not to goal versus addition of thiazide  Meds ordered this encounter  Medications   losartan (COZAAR) 50 MG tablet    Sig: Take 1 tablet (50 mg total) by mouth daily.    Dispense:  30 tablet    Refill:  5    Follow-up: No follow-ups on file.    Carolann Littler, MD

## 2021-07-13 NOTE — Patient Instructions (Signed)
Set up one month follow up with Dr Hassan Rowan. ?

## 2021-07-19 ENCOUNTER — Other Ambulatory Visit (HOSPITAL_COMMUNITY): Payer: Self-pay

## 2021-07-20 ENCOUNTER — Ambulatory Visit: Payer: No Typology Code available for payment source | Admitting: Family Medicine

## 2021-07-20 ENCOUNTER — Other Ambulatory Visit (HOSPITAL_COMMUNITY): Payer: Self-pay

## 2021-07-29 ENCOUNTER — Other Ambulatory Visit: Payer: Self-pay | Admitting: Rheumatology

## 2021-07-30 ENCOUNTER — Other Ambulatory Visit (HOSPITAL_COMMUNITY): Payer: Self-pay

## 2021-07-30 MED ORDER — HYDROXYCHLOROQUINE SULFATE 200 MG PO TABS
ORAL_TABLET | ORAL | 0 refills | Status: DC
  Filled 2021-07-30: qty 120, 84d supply, fill #0

## 2021-07-30 NOTE — Telephone Encounter (Addendum)
Next Visit: 12/12/2021 ? ?Last Visit: 07/12/2021 ? ?Labs: 07/10/2021 Glucose 124, MCH 26.9 ? ?Eye exam:  03/28/2021 WNL   ? ?Current Dose per office note 07/12/2021: PLQ 200 mg BID M-F. ? ?TD:[id number] disease  ? ?Last Fill: 05/04/2021 ? ?Okay to refill Plaquenil?  ?

## 2021-08-06 ENCOUNTER — Encounter: Payer: Self-pay | Admitting: Family Medicine

## 2021-08-06 ENCOUNTER — Ambulatory Visit (INDEPENDENT_AMBULATORY_CARE_PROVIDER_SITE_OTHER): Payer: No Typology Code available for payment source | Admitting: Family Medicine

## 2021-08-06 ENCOUNTER — Other Ambulatory Visit (HOSPITAL_COMMUNITY): Payer: Self-pay

## 2021-08-06 VITALS — BP 104/80 | HR 85 | Temp 98.0°F | Ht 63.0 in | Wt 172.1 lb

## 2021-08-06 DIAGNOSIS — I7300 Raynaud's syndrome without gangrene: Secondary | ICD-10-CM

## 2021-08-06 DIAGNOSIS — E669 Obesity, unspecified: Secondary | ICD-10-CM

## 2021-08-06 DIAGNOSIS — I10 Essential (primary) hypertension: Secondary | ICD-10-CM

## 2021-08-06 DIAGNOSIS — Z6830 Body mass index (BMI) 30.0-30.9, adult: Secondary | ICD-10-CM | POA: Diagnosis not present

## 2021-08-06 MED ORDER — WEGOVY 0.25 MG/0.5ML ~~LOC~~ SOAJ
0.2500 mg | SUBCUTANEOUS | 2 refills | Status: DC
  Filled 2021-08-06 – 2021-08-17 (×2): qty 2, 28d supply, fill #0
  Filled 2021-09-05: qty 2, 30d supply, fill #0

## 2021-08-06 MED ORDER — LOSARTAN POTASSIUM 100 MG PO TABS
50.0000 mg | ORAL_TABLET | Freq: Every day | ORAL | 1 refills | Status: DC
  Filled 2021-08-06: qty 45, 90d supply, fill #0

## 2021-08-06 NOTE — Patient Instructions (Signed)
Go to the [employer] site and click on patient savings; then go to the pharmacy and bring that coupon (or call with the numbers on it) ?

## 2021-08-06 NOTE — Progress Notes (Signed)
?Lindsay Wong ?DOB: [birth year] ?Encounter date: 08/06/2021 ? ?This is a 41 y.o. female who presents with ?Chief Complaint  ?Patient presents with  ? Hypertension  ? ? ?History of present illness: ? ?Patient saw Dr. Caryl Never on 07/13/2021 for elevated blood pressure and headaches.  Reported home blood pressures at that time in the 160s to 170s systolic.  Losartan 50 mg was added to her daily 5 mg of amlodipine and she was suggested to follow-up for recheck of blood pressure. Headaches resolved with treated blood pressure. No chest pain, pressure with exercise. Has fluid retention after exercise, edema.  ? ?Tried to make appointment at [facility] weight loss program, but hasn't heard back. Feels that she is still gaining weight. Has stopped eating out. Went to all water, zero sodas. Walking daily. If she exercises too much she gets swelling. Eating healthy - beans, salads, using olive oil/vinegar, lemon.  ? ?Bp has been ok at home- usually around 120's/70's.  ? ?Allergies  ?Allergen Reactions  ? Clarithromycin Shortness Of Breath  ?  REACTION: hives  ? Peg-2000 (Covid-19 Mrna Vaccine Component) Anaphylaxis  ? Latex   ?  Skin irritation from gloves  ? ?Current Meds  ?Medication Sig  ? esomeprazole (NEXIUM) 40 MG capsule TAKE 1 CAPSULE BY MOUTH ONCE A DAY  ? Ferrous Sulfate (IRON PO) Take by mouth 3 (three) times a week.  ? hydroxychloroquine (PLAQUENIL) 200 MG tablet TAKE 1 TABLET BY MOUTH 2 TIMES DAILY ON MONDAY THROUGH FRIDAY  ? Norethindrone-Ethinyl Estradiol-Fe Biphas (LO LOESTRIN FE) 1 MG-10 MCG / 10 MCG tablet Take 1 tablet every day by mouth.  ? Semaglutide-Weight Management (WEGOVY) 0.25 MG/0.5ML SOAJ Inject 0.25 mg into the skin once a week.  ? [DISCONTINUED] amLODipine (NORVASC) 5 MG tablet TAKE 1 TABLET BY MOUTH DAILY.  ? [DISCONTINUED] losartan (COZAAR) 50 MG tablet Take 1 tablet (50 mg total) by mouth daily.  ? ?Current Facility-Administered Medications for the 08/06/21 encounter (Office Visit) with  Wynn Banker, MD  ?Medication  ? 0.9 %  sodium chloride infusion  ? ? ?Review of Systems  ?Constitutional:  Negative for chills, fatigue and fever.  ?Respiratory:  Negative for cough, chest tightness, shortness of breath and wheezing.   ?Cardiovascular:  Positive for leg swelling. Negative for chest pain and palpitations.  ?Musculoskeletal:  Positive for arthralgias (stable; does flare after activity).  ? ?Objective: ? ?BP 104/80 (BP Location: Left Arm, Patient Position: Sitting, Cuff Size: Normal)   Pulse 85   Temp 98 ?F (36.7 ?C) (Oral)   Ht 5\' 3"  (1.6 m)   Wt 172 lb 1.6 oz (78.1 kg)   LMP 07/27/2021 (Approximate)   SpO2 100%   BMI 30.49 kg/m?   Weight: 172 lb 1.6 oz (78.1 kg)  ? ?BP Readings from Last 3 Encounters:  ?08/06/21 104/80  ?07/13/21 (!) 148/108  ?07/12/21 (!) 160/107  ? ?Wt Readings from Last 3 Encounters:  ?08/06/21 172 lb 1.6 oz (78.1 kg)  ?07/13/21 168 lb (76.2 kg)  ?07/12/21 168 lb 6.4 oz (76.4 kg)  ? ? ?Physical Exam ?Constitutional:   ?   General: She is not in acute distress. ?   Appearance: She is well-developed.  ?Cardiovascular:  ?   Rate and Rhythm: Normal rate and regular rhythm.  ?   Heart sounds: Normal heart sounds. No murmur heard. ?  No friction rub.  ?Pulmonary:  ?   Effort: Pulmonary effort is normal. No respiratory distress.  ?   Breath sounds: Normal  breath sounds. No wheezing or rales.  ?Musculoskeletal:  ?   Right lower leg: No edema.  ?   Left lower leg: No edema.  ?Neurological:  ?   Mental Status: She is alert and oriented to person, place, and time.  ?Psychiatric:     ?   Behavior: Behavior normal.  ? ? ?Assessment/Plan ? ?1. Essential hypertension ?Bp much better today. She would like to lose weight which will also help with weight loss. See below. We are going to stop amlodipine and monitor raynauds (for which she was taking it). Usually she does better in summer and she would like to see if eliminating amlodipine will help with edema. We are going to increase  losartan to 100mg  to compensate for stopping 5mg  amlodipine.  ?- Amb Ref to Medical Weight Management ? ?2. Class 1 obesity with serious comorbidity and body mass index (BMI) of 30.0 to 30.9 in adult, unspecified obesity type ?Phentermine contraindicated with htn, no issues with binge or over-eating. Discussed weight loss options and in addition to weight mgmt we are going to add wegovy pending coverage. Instructed her to get coupon. If wegovy not covered, we can try saxenda. Make sure to have small frequent meals and keep snack in case of lower blood sugar.  ?- Amb Ref to Medical Weight Management ?- Semaglutide-Weight Management (WEGOVY) 0.25 MG/0.5ML SOAJ; Inject 0.25 mg into the skin once a week.  Dispense: 2 mL; Refill: 2 ? ?3. Raynaud's disease without gangrene ?Tends to do better in summer; we are going to stop amlodipine to see if edema is better.  ? ? ?Return in about 3 months (around 11/06/2021) for Chronic condition visit. ?35 minutes spent in chart review , time with patient, discussion of med changes, exam, charting.  ? ? ? ?03-08-1978, MD ?

## 2021-08-07 ENCOUNTER — Encounter: Payer: Self-pay | Admitting: Family Medicine

## 2021-08-07 ENCOUNTER — Other Ambulatory Visit (HOSPITAL_COMMUNITY): Payer: Self-pay

## 2021-08-08 ENCOUNTER — Other Ambulatory Visit (HOSPITAL_COMMUNITY): Payer: Self-pay

## 2021-08-08 ENCOUNTER — Other Ambulatory Visit: Payer: Self-pay | Admitting: Family Medicine

## 2021-08-08 DIAGNOSIS — Z0289 Encounter for other administrative examinations: Secondary | ICD-10-CM

## 2021-08-09 ENCOUNTER — Other Ambulatory Visit (HOSPITAL_COMMUNITY): Payer: Self-pay

## 2021-08-09 ENCOUNTER — Telehealth: Payer: Self-pay | Admitting: Family Medicine

## 2021-08-09 MED ORDER — LOSARTAN POTASSIUM 100 MG PO TABS
100.0000 mg | ORAL_TABLET | Freq: Every day | ORAL | 1 refills | Status: DC
  Filled 2021-08-09: qty 90, 90d supply, fill #0

## 2021-08-09 NOTE — Telephone Encounter (Signed)
Patient called to follow up on PA for wegovy.  ? ? ? ? ? ?Good callback number is [phone] ? ? ? ? ?Please advise  ?

## 2021-08-10 ENCOUNTER — Other Ambulatory Visit (HOSPITAL_COMMUNITY): Payer: Self-pay

## 2021-08-11 ENCOUNTER — Other Ambulatory Visit (HOSPITAL_COMMUNITY): Payer: Self-pay

## 2021-08-13 ENCOUNTER — Other Ambulatory Visit (HOSPITAL_COMMUNITY): Payer: Self-pay

## 2021-08-13 NOTE — Telephone Encounter (Signed)
Prior auth for [facility] 0.25mg  sent to [email or web address]-key: [id number] pending review by the patient's insurance.   ?

## 2021-08-14 ENCOUNTER — Encounter: Payer: Self-pay | Admitting: Family Medicine

## 2021-08-14 ENCOUNTER — Encounter (INDEPENDENT_AMBULATORY_CARE_PROVIDER_SITE_OTHER): Payer: Self-pay | Admitting: Family Medicine

## 2021-08-14 ENCOUNTER — Ambulatory Visit (INDEPENDENT_AMBULATORY_CARE_PROVIDER_SITE_OTHER): Payer: No Typology Code available for payment source | Admitting: Family Medicine

## 2021-08-14 VITALS — BP 99/67 | HR 79 | Temp 98.3°F | Ht 62.0 in | Wt 166.0 lb

## 2021-08-14 DIAGNOSIS — R5383 Other fatigue: Secondary | ICD-10-CM | POA: Diagnosis not present

## 2021-08-14 DIAGNOSIS — R0683 Snoring: Secondary | ICD-10-CM

## 2021-08-14 DIAGNOSIS — I10 Essential (primary) hypertension: Secondary | ICD-10-CM

## 2021-08-14 DIAGNOSIS — D649 Anemia, unspecified: Secondary | ICD-10-CM | POA: Diagnosis not present

## 2021-08-14 DIAGNOSIS — R0602 Shortness of breath: Secondary | ICD-10-CM

## 2021-08-14 DIAGNOSIS — E559 Vitamin D deficiency, unspecified: Secondary | ICD-10-CM | POA: Diagnosis not present

## 2021-08-14 DIAGNOSIS — Z1331 Encounter for screening for depression: Secondary | ICD-10-CM | POA: Diagnosis not present

## 2021-08-14 DIAGNOSIS — Z9189 Other specified personal risk factors, not elsewhere classified: Secondary | ICD-10-CM

## 2021-08-14 DIAGNOSIS — E7849 Other hyperlipidemia: Secondary | ICD-10-CM

## 2021-08-14 DIAGNOSIS — F39 Unspecified mood [affective] disorder: Secondary | ICD-10-CM

## 2021-08-14 DIAGNOSIS — E669 Obesity, unspecified: Secondary | ICD-10-CM

## 2021-08-14 DIAGNOSIS — Z6830 Body mass index (BMI) 30.0-30.9, adult: Secondary | ICD-10-CM

## 2021-08-15 LAB — LIPID PANEL WITH LDL/HDL RATIO
Cholesterol, Total: 247 mg/dL — ABNORMAL HIGH (ref 100–199)
HDL: 61 mg/dL (ref >39)
LDL Chol Calc (NIH): 160 mg/dL — ABNORMAL HIGH (ref 0–99)
LDL/HDL Ratio: 2.6 ratio (ref 0.0–3.2)
Triglycerides: 145 mg/dL (ref 0–149)
VLDL Cholesterol Cal: 26 mg/dL (ref 5–40)

## 2021-08-15 LAB — VITAMIN B12: Vitamin B-12: 609 pg/mL (ref 232–1245)

## 2021-08-15 LAB — HEMOGLOBIN A1C
Est. average glucose Bld gHb Est-mCnc: 108 mg/dL
Hgb A1c MFr Bld: 5.4 % (ref 4.8–5.6)

## 2021-08-15 LAB — INSULIN, RANDOM: INSULIN: 19.9 u[IU]/mL (ref 2.6–24.9)

## 2021-08-15 LAB — VITAMIN D 25 HYDROXY (VIT D DEFICIENCY, FRACTURES): Vit D, 25-Hydroxy: 45.2 ng/mL (ref 30.0–100.0)

## 2021-08-15 LAB — FOLATE: Folate: 10.9 ng/mL (ref >3.0)

## 2021-08-16 ENCOUNTER — Encounter: Payer: Self-pay | Admitting: Family Medicine

## 2021-08-16 ENCOUNTER — Other Ambulatory Visit (HOSPITAL_COMMUNITY): Payer: Self-pay

## 2021-08-17 ENCOUNTER — Other Ambulatory Visit (HOSPITAL_COMMUNITY): Payer: Self-pay

## 2021-08-21 NOTE — Telephone Encounter (Signed)
Rx was denied-see prior note sent to PCP. ?

## 2021-08-22 NOTE — Progress Notes (Signed)
? ? ? ? ?Chief Complaint:  ? ?OBESITY ?Lindsay Wong (MR# [id number]) is a 41 y.o. female who presents for evaluation and treatment of obesity and related comorbidities. Current Body mass index is 30.36 kg/m?Marland Kitchen. Lindsay Wong has been struggling with her weight for many years and has been unsuccessful in either losing weight, maintaining weight loss, or reaching her healthy weight goal. ? 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 She states "she is desperate, she hunting, and she wants to lose weight". Her PCP recently gave her Reginal LutesWegovy and she is still awaiting insurance approval.  ? ?Lindsay Wong is currently in the action stage of change and ready to dedicate time achieving and maintaining a healthier weight. Lindsay Wong is interested in becoming our patient and working on intensive lifestyle modifications including (but not limited to) diet and exercise for weight loss. ? ?Waleska's habits were reviewed today and are as follows: Her family eats meals together, she thinks her family will eat healthier with her, her desired weight loss is 26-30 lbs, she started gaining weight in 2019, her heaviest weight ever was 172 pounds, she has significant food cravings issues, she snacks frequently in the evenings, she skips meals frequently, she frequently makes poor food choices, and she struggles with emotional eating. ? ?Depression Screen ?Charissa's Food and Mood (modified PHQ-9) score was 13. ? ? ?  08/14/2021  ?  7:35 AM  ?Depression screen PHQ 2/9  ?Decreased Interest 3  ?Down, Depressed, Hopeless 2  ?PHQ - 2 Score 5  ?Altered sleeping 2  ?Tired, decreased energy 3  ?Change in appetite 1  ?Feeling bad or failure about yourself  1  ?Trouble concentrating 1  ?Moving slowly or fidgety/restless 0  ?Suicidal thoughts 0  ?PHQ-9 Score 13  ?Difficult doing work/chores Somewhat difficult  ? ?Subjective:  ? ?1. Other fatigue ?Lindsay Wong admits to daytime somnolence and admits to waking up still tired. Patient  has a history of symptoms of daytime fatigue, morning fatigue, and morning headache. Lindsay Wong generally gets 3 or 6 hours of sleep per night, and states that she has nightime awakenings. Snoring is present. Apneic episodes are not present. Lindsay Wong has a history of chronic fatigue syndrome. Epworth Sleepiness Score is 19. She only sleeps 3-6 hours per night, and her 43 year old son sleeps with her. Poor sleep for 4 years.  ? ?2. SOBOE (shortness of breath on exertion) ?Lindsay Wong notes increasing shortness of breath with exercising and seems to be worsening over time with weight gain. She notes getting out of breath sooner with activity than she used to. This has not gotten worse recently. Lindsay Wong denies shortness of breath at rest or orthopnea. ? ?3. Essential hypertension ?Lindsay Wong's blood pressure shot up 1 month ago, and she went on losartan. Her at home blood pressures range at 120's/70's-80's.  ? ?4. Other hyperlipidemia ?Lindsay Wong was diagnosed 2 years ago. Currently diet controlled with no medications. She has a family history of CVA in her grandmother with high cholesterol history in family.  ? ?5. Anemia, unspecified type ?Lindsay Wong takes 3 Fe supplement per week. Chronic with Fe deficiency anemia. Last CBC was 2/28, and within normal limits for a couple of years. ? ?6. Vitamin D deficiency ?She is currently taking OTC vitamin D 2,000 IU each day. She denies nausea, vomiting or muscle weakness, but notes fatigue. ? ?7. Mood disorder (HCC), with emotional eating ?Lindsay Wong has a history of depression and  GAD. She eats when bored and occasionally stress eats. She sees NP in Psychiatry for counseling. She was on Cymbalta, but has been off for 1 month.  ? ?8. At risk for impaired metabolic function ?Lindsay Wong is at increased risk for impaired metabolic function due to current lifestyle and obesity. ? ?Assessment/Plan:  ? ?Orders Placed This Encounter  ?Procedures  ? Hemoglobin A1c  ? Insulin, random  ? Lipid Panel With LDL/HDL Ratio  ? VITAMIN D 25  Hydroxy (Vit-D Deficiency, Fractures)  ? Vitamin B12  ? Folate  ? EKG 12-Lead  ? ? ?There are no discontinued medications.  ? ?No orders of the defined types were placed in this encounter. ?  ? ?1. Other fatigue ?Tiffnay does feel that her weight is causing her energy to be lower than it should be. Fatigue may be related to obesity, depression or many other causes. Labs will be ordered, and in the meanwhile, Angles will focus on self care including making healthy food choices, increasing physical activity and focusing on stress reduction. Lindsay Wong will discuss with PCP possible sleep apnea evaluation and referred to continue with Rheumatology for chronic fatigue syndrome and autoimmune disorder. ? ?- EKG 12-Lead ?- Hemoglobin A1c ?- Insulin, random ? ?2. SOBOE (shortness of breath on exertion) ?Lindsay Wong does feel that she gets out of breath more easily that she used to when she exercises. Lindsay Wong's shortness of breath appears to be obesity related and exercise induced. She has agreed to work on weight loss and gradually increase exercise to treat her exercise induced shortness of breath. Will continue to monitor closely. ? ?3. Essential hypertension ?We will check labs today. ? ?Counseled Lindsay Flakes on pathophysiology of disease and discussed treatment plan, which always includes dietary and lifestyle modification as first line.  ?Lifestyle changes such as following our low salt, heart healthy meal plan and engaging in a regular exercise program discussed  ?- Avoid buying foods that are: processed, frozen, or prepackaged to avoid excess salt. ?- Ambulatory blood pressure monitoring encouraged.  Reminded patient that if they ever feel poorly in any way, to check their blood pressure and pulse as well. ?- We will continue to monitor closely alongside PCP/ specialists.  Pt reminded to also f/up with those individuals as instructed by them.  ?- We will continue to monitor symptoms as they relate to the her weight loss  journey. ? ?4. Other hyperlipidemia ?Cardiovascular risk and specific lipid/LDL goals reviewed.  We discussed several lifestyle modifications today. We will check labs today. Brynnlee will continue to work on diet, exercise and weight loss efforts. Orders and follow up as documented in patient record.  ? ?Counseling ?Intensive lifestyle modifications are the first line treatment for this issue. ?Dietary changes: Increase soluble fiber. Decrease simple carbohydrates. ?Exercise changes: Moderate to vigorous-intensity aerobic activity 150 minutes per week if tolerated. ?Lipid-lowering medications: see documented in medical record. ? ?- Lipid Panel With LDL/HDL Ratio ? ?5. Anemia, unspecified type ?We will check labs today, and we will follow up at Keely's next office visit. ? ?- Vitamin B12 ?- Folate ? ?6. Vitamin D deficiency ?We will check labs today. Donnette will continue her Vitamin D supplement, and will follow-up for routine testing of Vitamin D, at least 2-3 times per year to avoid over-replacement. ? ?- VITAMIN D 25 Hydroxy (Vit-D Deficiency, Fractures) ? ?7. Mood disorder (HCC), with emotional eating ?Sola declines a referral for Dr. Dewaine Conger for now. She will continue with her Psychiatrist NP, and will monitor  closely.  ? ?8. Depression screening ?Leonore had a positive depression screening. Depression is commonly associated with obesity and often results in emotional eating behaviors. We will monitor this closely and work on CBT to help improve the non-hunger eating patterns. Referral to Psychology may be required if no improvement is seen as she continues in our clinic. ? ?9. At risk for impaired metabolic function ?Jeana was given approximately 30 minutes of impaired  metabolic function prevention counseling today. We discussed intensive lifestyle modifications today with an emphasis on specific nutrition and exercise instructions and strategies.  ? ?Repetitive spaced learning was employed today to elicit superior  memory formation and behavioral change.  ? ?10. Obesity with current BMI of 30.4 ?Deryn is currently in the action stage of change and her goal is to continue with weight loss efforts. I recommend Falisa begin the structured

## 2021-08-28 ENCOUNTER — Ambulatory Visit (INDEPENDENT_AMBULATORY_CARE_PROVIDER_SITE_OTHER): Payer: No Typology Code available for payment source | Admitting: Family Medicine

## 2021-08-28 ENCOUNTER — Encounter (INDEPENDENT_AMBULATORY_CARE_PROVIDER_SITE_OTHER): Payer: Self-pay | Admitting: Family Medicine

## 2021-08-28 VITALS — BP 104/70 | HR 71 | Temp 97.5°F | Ht 62.0 in | Wt 168.0 lb

## 2021-08-28 DIAGNOSIS — E8881 Metabolic syndrome: Secondary | ICD-10-CM | POA: Diagnosis not present

## 2021-08-28 DIAGNOSIS — Z9189 Other specified personal risk factors, not elsewhere classified: Secondary | ICD-10-CM

## 2021-08-28 DIAGNOSIS — I10 Essential (primary) hypertension: Secondary | ICD-10-CM

## 2021-08-28 DIAGNOSIS — E669 Obesity, unspecified: Secondary | ICD-10-CM

## 2021-08-28 DIAGNOSIS — E559 Vitamin D deficiency, unspecified: Secondary | ICD-10-CM | POA: Diagnosis not present

## 2021-08-28 DIAGNOSIS — E7849 Other hyperlipidemia: Secondary | ICD-10-CM | POA: Diagnosis not present

## 2021-08-28 DIAGNOSIS — Z6830 Body mass index (BMI) 30.0-30.9, adult: Secondary | ICD-10-CM

## 2021-08-28 NOTE — Telephone Encounter (Signed)
Please advise patient.  

## 2021-08-28 NOTE — Telephone Encounter (Signed)
I called [employer] at [phone], spoke with Tyrone, informed him of the request as below and he stated an appeal would need to be performed in this case.  He will fax the appeal form to be completed by PCP. ?

## 2021-08-28 NOTE — Patient Instructions (Signed)
The 10-year ASCVD risk score (Arnett DK, et al., 2019) is: 1.5% ?  Values used to calculate the score: ?    Age: 41 years ?    Sex: Female ?    Is Non-Hispanic African American: No ?    Diabetic: Yes ?    Tobacco smoker: No ?    Systolic Blood Pressure: 104 mmHg ?    Is BP treated: Yes ?    HDL Cholesterol: 61 mg/dL ?    Total Cholesterol: 247 mg/dL ? ?

## 2021-09-05 ENCOUNTER — Other Ambulatory Visit (HOSPITAL_COMMUNITY): Payer: Self-pay

## 2021-09-05 MED ORDER — BETAMETHASONE DIPROPIONATE 0.05 % EX OINT
TOPICAL_OINTMENT | CUTANEOUS | 3 refills | Status: DC
  Filled 2021-09-05: qty 45, 30d supply, fill #0

## 2021-09-05 NOTE — Telephone Encounter (Signed)
PCP completed the appeal form, this was faxed to [employer] at [phone] and sent to be scanned. ?

## 2021-09-06 ENCOUNTER — Other Ambulatory Visit (HOSPITAL_COMMUNITY): Payer: Self-pay

## 2021-09-11 NOTE — Progress Notes (Signed)
? ? ? ?Chief Complaint:  ? ?OBESITY ?Riannah is here to discuss her progress with her obesity treatment plan along with follow-up of her obesity related diagnoses. Batoul is on the Category 1 Plan and states she is following her eating plan approximately 70% of the time. Tyrianna states she is hiking for 90 minutes 4 times per week. ? ?Today's visit was #: 2 ?Starting weight: 166 lbs ?Starting date: 08/14/2021 ?Today's weight: 168 lbs ?Today's date: 08/28/2021 ?Total lbs lost to date: 0 ?Total lbs lost since last in-office visit: 0 ? ?Interim History: FLOETTA BRICKEY is here today for her first follow-up office visit since starting the program with [place].  All blood work/ lab tests that were recently ordered by myself or an outside provider were reviewed with patient today per their request.   Extended time was spent counseling her on all new disease processes that were discovered or preexisting ones that are affected by BMI.  she understands that many of these abnormalities will need to monitored regularly along with the current treatment plan of prudent dietary changes, in which we are making each and every office visit, to improve these health parameters. Brianni's first week  she wasn't able to start yet but second week was able to follow about 70%. Evenings she was cravings sweets after dinner. Ate 2 oz of protein at dinner=1-2 cups of veggies. Lunch-wraps with 2 oz of deli meat. Snacks-had a granola and nuts over frozen yogurt in 1/2 gallon containers; was not measuring the amounts. She did a great job increasing outdoor activities/hiking with her dog.  ? ?Subjective:  ? ?1. Insulin resistance ?New diagnosis. Alila's insurance wont cover [phone] that her PCP is trying to get approved currently. I discussed labs with the patient today. ? ?2. Other hyperlipidemia ?Worsening. Monserrath's ASCVD risk score is 1.5%. no need for medications. She has a strong family history-material side. Her LDL went up a little versus last time and HDL went  down a little. I discussed labs with the patient today. ? ?3. Benign essential hypertension ?Maryjane is on losartan, and her CMP drawn previously was within normal limits. I discussed labs with the patient today. ? ?4. Vitamin D deficiency ?Iyannah is currently taking OTC vitamin D 2,000 IU each day. She denies nausea, vomiting or muscle weakness. I discussed labs with the patient today. ? ?5. At risk for diabetes mellitus ?Vaniah is at higher than average risk for developing diabetes due to insulin resistance and, newly diagnosed. ? ?Assessment/Plan:  ?No orders of the defined types were placed in this encounter. ? ? ?There are no discontinued medications.  ? ?No orders of the defined types were placed in this encounter. ?  ? ?1. Insulin resistance ?Insulin resistance handout was given and metformin as well. Counseling done. Continue prudent nutritional plan and weight loss. We will recheck labs in 3 months. She must eat all foods on the plan as first line treatment and decrease snack calories.  ? ?2. Other hyperlipidemia ?Elleana increased LDL, recommended patient to discuss with PCP for further treatment. She will increase her exercise in future to continue 60+ HDL levels. We will recheck once 7-10% weight loss. ? ?3. Benign essential hypertension ?Taniah's blood pressure is at goal today. Continue medication management per PCP. Decrease salt, increase water to 80+ oz per day.  ? ?4. Vitamin D deficiency ?Enrique's Vit D is at goal. She will continue OTC supplementation and increase outdoor activity. Counseling was done.  ? ?5. At risk for  diabetes mellitus ?[name] was given approximately 24 minutes of diabetic education and counseling today. We discussed intensive lifestyle modifications today with an emphasis on weight loss as well as increasing exercise and decreasing simple carbohydrates in her diet. We also reviewed medication options with an emphasis on risk versus benefits of those discussed. ? ?Repetitive spaced learning  was employed today to elicit superior memory formation and behavioral change. ? ?6. Obesity, current BMI 30.8 ?[name] is currently in the action stage of change. As such, her goal is to continue with weight loss efforts. She has agreed to the Category 1 Plan.  ? ?Exercise goals: As is. ? ?Behavioral modification strategies: increasing lean protein intake and decreasing simple carbohydrates. ? ?[name] has agreed to follow-up with our clinic in 2 to 3 weeks. She was informed of the importance of frequent follow-up visits to maximize her success with intensive lifestyle modifications for her multiple health conditions.  ? ?Objective:  ? ?Blood pressure 104/70, pulse 71, temperature (!) 97.5 ?F (36.4 ?C), height 5\' 2"  (1.575 m), weight 168 lb (76.2 kg), last menstrual period 08/07/2021, SpO2 100 %. ?Body mass index is 30.73 kg/m?. ? ?General: Cooperative, alert, well developed, in no acute distress. ?HEENT: Conjunctivae and lids unremarkable. ?Cardiovascular: Regular rhythm.  ?Lungs: Normal work of breathing. ?Neurologic: No focal deficits.  ? ?Lab Results  ?Component Value Date  ? CREATININE 0.94 07/10/2021  ? BUN 11 07/10/2021  ? NA 138 07/10/2021  ? K 3.9 07/10/2021  ? CL 103 07/10/2021  ? CO2 25 07/10/2021  ? ?Lab Results  ?Component Value Date  ? ALT 15 07/10/2021  ? AST 14 07/10/2021  ? ALKPHOS 76 06/30/2020  ? BILITOT 0.4 07/10/2021  ? ?Lab Results  ?Component Value Date  ? HGBA1C 5.4 08/14/2021  ? HGBA1C 5.2 04/17/2020  ? HGBA1C 5.4 10/13/2017  ? HGBA1C 5.1 09/23/2012  ? ?Lab Results  ?Component Value Date  ? INSULIN 19.9 08/14/2021  ? ?Lab Results  ?Component Value Date  ? TSH 2.50 07/10/2021  ? ?Lab Results  ?Component Value Date  ? CHOL 247 (H) 08/14/2021  ? HDL 61 08/14/2021  ? LDLCALC 160 (H) 08/14/2021  ? TRIG 145 08/14/2021  ? CHOLHDL 3 11/29/2020  ? ?Lab Results  ?Component Value Date  ? VD25OH 45.2 08/14/2021  ? VD25OH 33.14 07/17/2018  ? ?Lab Results  ?Component Value Date  ? WBC 7.7 07/10/2021  ? HGB 13.5  07/10/2021  ? HCT 41.6 07/10/2021  ? MCV 83.0 07/10/2021  ? PLT 392 07/10/2021  ? ?Lab Results  ?Component Value Date  ? IRON 36 (L) 11/29/2020  ? TIBC 383 11/29/2020  ? FERRITIN 9 (L) 11/29/2020  ? ?Attestation Statements:  ? ?Reviewed by clinician on day of visit: allergies, medications, problem list, medical history, surgical history, family history, social history, and previous encounter notes. ? ? ?I, Burt KnackSharon Martin, am acting as transcriptionist for Marsh & McLennanDeborah Kyriakos Babler, DO. ? ?I have reviewed the above documentation for accuracy and completeness, and I agree with the above. Carlye Grippe-  Jwan Hornbaker J Elijahjames Fuelling, D.O. ? ?The 21st Century Cures Act was signed into law in 2016 which includes the topic of electronic health records.  This provides immediate access to information in MyChart.  This includes consultation notes, operative notes, office notes, lab results and pathology reports.  If you have any questions about what you read please let us know at your next visit so we can discuss your concerns and take corrective action if need be.  We are right  here with you. ? ? ?

## 2021-09-12 ENCOUNTER — Other Ambulatory Visit (HOSPITAL_COMMUNITY): Payer: Self-pay

## 2021-09-12 ENCOUNTER — Ambulatory Visit (INDEPENDENT_AMBULATORY_CARE_PROVIDER_SITE_OTHER): Payer: No Typology Code available for payment source | Admitting: Adult Health

## 2021-09-12 ENCOUNTER — Encounter (INDEPENDENT_AMBULATORY_CARE_PROVIDER_SITE_OTHER): Payer: Self-pay | Admitting: Adult Health

## 2021-09-12 VITALS — BP 102/68 | HR 78 | Temp 97.6°F | Ht 62.0 in | Wt 166.0 lb

## 2021-09-12 DIAGNOSIS — Z9189 Other specified personal risk factors, not elsewhere classified: Secondary | ICD-10-CM

## 2021-09-12 DIAGNOSIS — E8881 Metabolic syndrome: Secondary | ICD-10-CM

## 2021-09-12 DIAGNOSIS — E669 Obesity, unspecified: Secondary | ICD-10-CM | POA: Diagnosis not present

## 2021-09-12 DIAGNOSIS — I10 Essential (primary) hypertension: Secondary | ICD-10-CM

## 2021-09-12 DIAGNOSIS — Z6830 Body mass index (BMI) 30.0-30.9, adult: Secondary | ICD-10-CM | POA: Diagnosis not present

## 2021-09-12 MED ORDER — METFORMIN HCL 500 MG PO TABS
ORAL_TABLET | ORAL | 0 refills | Status: DC
  Filled 2021-09-12: qty 30, 30d supply, fill #0

## 2021-09-12 MED ORDER — LOSARTAN POTASSIUM 100 MG PO TABS: 100.0000 mg | ORAL_TABLET | Freq: Every day | ORAL | 0 refills | Status: DC

## 2021-09-18 ENCOUNTER — Other Ambulatory Visit (HOSPITAL_COMMUNITY): Payer: Self-pay

## 2021-09-18 ENCOUNTER — Other Ambulatory Visit: Payer: Self-pay | Admitting: Family Medicine

## 2021-09-18 MED ORDER — ESOMEPRAZOLE MAGNESIUM 40 MG PO CPDR
DELAYED_RELEASE_CAPSULE | Freq: Every day | ORAL | 0 refills | Status: AC
  Filled 2021-09-18: qty 30, 30d supply, fill #0

## 2021-09-23 NOTE — Progress Notes (Addendum)
? ? ? ?Chief Complaint:  ? ?OBESITY ?Lindsay Wong is here to discuss her progress with her obesity treatment plan along with follow-up of her obesity related diagnoses. Lindsay Wong is on the Category 1 Plan and states she is following her eating plan approximately 50% of the time. Lindsay Wong states she is doing weights and walking 60 minutes 5-6 times per week. ? ?Today's visit was #: 3 ?Starting weight: 166 ?Starting date: 08/14/2021 ?Today's weight: 166 ?Today's date: 09/12/2021 ?Total lbs lost to date: 0 ?Total lbs lost since last in-office visit: 2 ? ?Interim History:  ?Lindsay Wong has 3 children, all boys.   ?Her 78 year old is attends [employer], 43 year old is at [employer] and her 43 year old is home schooled.  ?Her 43 year old was previously at [employer] and will attend [employer] in the fall of 2023. ? ?Subjective:  ? ?1. Insulin resistance ?Lindsay Wong's insurance denied coverage for [facility].  She is agreeable to starting Metformin therapy.   ? ?2. Benign essential hypertension ?On 08/06/21 Lindsay Wong's primary care provider discontinued her Amlodipine and increased her Losartan from 50 mg to 100 mg daily.  Her ambulatory reading; systolic blood pressures 100-110's and diastolic blood pressures  in the 70's.   ?She denies dizziness with position change and denies excessive fatigue. Her blood pressure readings have been trending down the last several office visits.  ? ?3. At risk for diarrhea ?The patient is at a higher than average risk for developing diarrhea due to starting metformin. ? ? ?Assessment/Plan:  ? ?1. Insulin resistance ?Lindsay Wong will start on Metformin 500 mg 1/2 tablet daily for one week and then increase to 1 tablet the second week.   ?Prescription provided.  ? ?Start Metformin 500mg - 1/2 tab QD for one week, then increase to one full tab QD- hold at this dose. ?Disp 30 tabs, RF 0 ? ?2. Benign essential hypertension ?Lindsay Wong will decrease her Losartan to 50 mg daily; she will take 1/2 tablet daily.  No refill  needed at this time.  She is to monitor her blood pressure at home. ?I will send update on med changes to her PCP. ? ?3. At risk for diarrhea ?The patient is at a higher than average risk of diarrhea due to starting Metformin. ? ?4. Obesity, current BMI 30.5 ? ?Lindsay Wong is currently in the action stage of change. As such, her goal is to continue with weight loss efforts. She has agreed to the Category 1 Plan.  ? ?Exercise goals: as is ? ?Behavioral modification strategies: increasing lean protein intake, decreasing simple carbohydrates, meal planning and cooking strategies, keeping healthy foods in the home, and planning for success. ? ?Lindsay Wong has agreed to follow-up with our clinic in 2 weeks. She was informed of the importance of frequent follow-up visits to maximize her success with intensive lifestyle modifications for her multiple health conditions.  ? ? ?Objective:  ? ?Blood pressure 102/68, pulse 78, temperature 97.6 ?F (36.4 ?C), height 5\' 2"  (1.575 m), weight 166 lb (75.3 kg), SpO2 100 %. ?Body mass index is 30.36 kg/m?. ? ?General: Cooperative, alert, well developed, in no acute distress. ?HEENT: Conjunctivae and lids unremarkable. ?Cardiovascular: Regular rhythm.  ?Lungs: Normal work of breathing. ?Neurologic: No focal deficits.  ? ?Lab Results  ?Component Value Date  ? CREATININE 0.94 07/10/2021  ? BUN 11 07/10/2021  ? NA 138 07/10/2021  ? K 3.9 07/10/2021  ? CL 103 07/10/2021  ? CO2 25 07/10/2021  ? ?Lab Results  ?  Component Value Date  ? ALT 15 07/10/2021  ? AST 14 07/10/2021  ? ALKPHOS 76 06/30/2020  ? BILITOT 0.4 07/10/2021  ? ?Lab Results  ?Component Value Date  ? HGBA1C 5.4 08/14/2021  ? HGBA1C 5.2 04/17/2020  ? HGBA1C 5.4 10/13/2017  ? HGBA1C 5.1 09/23/2012  ? ?Lab Results  ?Component Value Date  ? INSULIN 19.9 08/14/2021  ? ?Lab Results  ?Component Value Date  ? TSH 2.50 07/10/2021  ? ?Lab Results  ?Component Value Date  ? CHOL 247 (H) 08/14/2021  ? HDL 61 08/14/2021  ? LDLCALC 160 (H) 08/14/2021  ?  TRIG 145 08/14/2021  ? CHOLHDL 3 11/29/2020  ? ?Lab Results  ?Component Value Date  ? VD25OH 45.2 08/14/2021  ? VD25OH 33.14 07/17/2018  ? ?Lab Results  ?Component Value Date  ? WBC 7.7 07/10/2021  ? HGB 13.5 07/10/2021  ? HCT 41.6 07/10/2021  ? MCV 83.0 07/10/2021  ? PLT 392 07/10/2021  ? ?Lab Results  ?Component Value Date  ? IRON 36 (L) 11/29/2020  ? TIBC 383 11/29/2020  ? FERRITIN 9 (L) 11/29/2020  ? ? ?Attestation Statements:  ? ?Reviewed by clinician on day of visit: allergies, medications, problem list, medical history, surgical history, family history, social history, and previous encounter notes. ? ?I, Janey Greaser, am acting as Energy manager for William Hamburger, NP. ? ?I have reviewed the above documentation for accuracy and completeness, and I agree with the above. -  Willson Lipa d. Alexis Reber, NP-C ? ?

## 2021-09-24 DIAGNOSIS — E8881 Metabolic syndrome: Secondary | ICD-10-CM | POA: Insufficient documentation

## 2021-09-24 DIAGNOSIS — E88819 Insulin resistance, unspecified: Secondary | ICD-10-CM | POA: Insufficient documentation

## 2021-09-24 DIAGNOSIS — E669 Obesity, unspecified: Secondary | ICD-10-CM | POA: Insufficient documentation

## 2021-09-26 ENCOUNTER — Encounter (INDEPENDENT_AMBULATORY_CARE_PROVIDER_SITE_OTHER): Payer: Self-pay | Admitting: Adult Health

## 2021-09-26 ENCOUNTER — Other Ambulatory Visit (HOSPITAL_COMMUNITY): Payer: Self-pay

## 2021-09-26 ENCOUNTER — Ambulatory Visit (INDEPENDENT_AMBULATORY_CARE_PROVIDER_SITE_OTHER): Payer: No Typology Code available for payment source | Admitting: Adult Health

## 2021-09-26 VITALS — BP 94/62 | HR 73 | Temp 98.1°F | Ht 62.0 in | Wt 168.0 lb

## 2021-09-26 DIAGNOSIS — E669 Obesity, unspecified: Secondary | ICD-10-CM

## 2021-09-26 DIAGNOSIS — E66811 Obesity, class 1: Secondary | ICD-10-CM

## 2021-09-26 DIAGNOSIS — E88819 Insulin resistance, unspecified: Secondary | ICD-10-CM

## 2021-09-26 DIAGNOSIS — E8881 Metabolic syndrome: Secondary | ICD-10-CM

## 2021-09-26 DIAGNOSIS — Z6830 Body mass index (BMI) 30.0-30.9, adult: Secondary | ICD-10-CM | POA: Diagnosis not present

## 2021-09-26 DIAGNOSIS — I10 Essential (primary) hypertension: Secondary | ICD-10-CM | POA: Diagnosis not present

## 2021-09-26 DIAGNOSIS — Z9189 Other specified personal risk factors, not elsewhere classified: Secondary | ICD-10-CM

## 2021-09-26 MED ORDER — AMLODIPINE BESYLATE 2.5 MG PO TABS
2.5000 mg | ORAL_TABLET | Freq: Every day | ORAL | 0 refills | Status: DC
  Filled 2021-09-26: qty 30, 30d supply, fill #0

## 2021-09-26 MED ORDER — METFORMIN HCL 500 MG PO TABS
ORAL_TABLET | ORAL | 0 refills | Status: DC
  Filled 2021-09-26: qty 30, 30d supply, fill #0

## 2021-10-02 NOTE — Progress Notes (Unsigned)
Chief Complaint:   OBESITY Lindsay Wong is here to discuss her progress with her obesity treatment plan along with follow-up of her obesity related diagnoses. Lindsay Wong is on {MWMwtlossportion/plan2:23431} and states she is following her eating plan approximately ***% of the time. Lindsay Wong states she is *** *** minutes *** times per week.  Today's visit was #: 4 Starting weight: 166 lbs Starting date: 08/14/2021 Today's weight: 168 lbs Today's date: 09/26/21 Total lbs lost to date: *** Total lbs lost since last in-office visit: ***  Interim History: ***  Subjective:   1. Insulin resistance ***  2. Benign essential hypertension ***  3. At risk for complication associated with hypotension ***  4. Obesity, current BMI 30.8 ***   Assessment/Plan:   1. Insulin resistance ***  2. Benign essential hypertension ***  3. At risk for complication associated with hypotension ***  4. Obesity, current BMI 30.8 ***  Lindsay Wong {CHL AMB IS/IS NOT:210130109} currently in the action stage of change. As such, her goal is to {MWMwtloss#1:210800005}. She has agreed to {MWMwtlossportion/plan2:23431}.   Exercise goals: {MWM EXERCISE RECS:23473}  Behavioral modification strategies: {MWMwtlossdietstrategies3:23432}.  Lindsay Wong has agreed to follow-up with our clinic in {NUMBER 1-10:22536} weeks. She was informed of the importance of frequent follow-up visits to maximize her success with intensive lifestyle modifications for her multiple health conditions.   ***delete paragraph if no labs orderedTina was informed we would discuss her lab results at her next visit unless there is a critical issue that needs to be addressed sooner. Lindsay Wong agreed to keep her next visit at the agreed upon time to discuss these results.  Objective:   Blood pressure 94/62, pulse 73, temperature 98.1 F (36.7 C), height 5\' 2"  (1.575 m), weight 168 lb (76.2 kg), SpO2 99 %. Body mass index is 30.73 kg/m.  General: Cooperative,  alert, well developed, in no acute distress. HEENT: Conjunctivae and lids unremarkable. Cardiovascular: Regular rhythm.  Lungs: Normal work of breathing. Neurologic: No focal deficits.   Lab Results  Component Value Date   CREATININE 0.94 07/10/2021   BUN 11 07/10/2021   NA 138 07/10/2021   K 3.9 07/10/2021   CL 103 07/10/2021   CO2 25 07/10/2021   Lab Results  Component Value Date   ALT 15 07/10/2021   AST 14 07/10/2021   ALKPHOS 76 06/30/2020   BILITOT 0.4 07/10/2021   Lab Results  Component Value Date   HGBA1C 5.4 08/14/2021   HGBA1C 5.2 04/17/2020   HGBA1C 5.4 10/13/2017   HGBA1C 5.1 09/23/2012   Lab Results  Component Value Date   INSULIN 19.9 08/14/2021   Lab Results  Component Value Date   TSH 2.50 07/10/2021   Lab Results  Component Value Date   CHOL 247 (H) 08/14/2021   HDL 61 08/14/2021   LDLCALC 160 (H) 08/14/2021   TRIG 145 08/14/2021   CHOLHDL 3 11/29/2020   Lab Results  Component Value Date   VD25OH 45.2 08/14/2021   VD25OH 33.14 07/17/2018   Lab Results  Component Value Date   WBC 7.7 07/10/2021   HGB 13.5 07/10/2021   HCT 41.6 07/10/2021   MCV 83.0 07/10/2021   PLT 392 07/10/2021   Lab Results  Component Value Date   IRON 36 (L) 11/29/2020   TIBC 383 11/29/2020   FERRITIN 9 (L) 11/29/2020    Obesity Behavioral Intervention:   Approximately 15 minutes were spent on the discussion below.  ASK: We discussed the diagnosis of obesity with Lindsay Wong today and  Lindsay Wong agreed to give [place] permission to discuss obesity behavioral modification therapy today.  ASSESS: Lindsay Wong has the diagnosis of obesity and her BMI today is ***. Lindsay Wong {ACTION; IS/IS GI:[id number] in the action stage of change.   ADVISE: Lindsay Wong was educated on the multiple health risks of obesity as well as the benefit of weight loss to improve her health. She was advised of the need for long term treatment and the importance of lifestyle modifications to improve her current health and  to decrease her risk of future health problems.  AGREE: Multiple dietary modification options and treatment options were discussed and Lindsay Wong agreed to follow the recommendations documented in the above note.  ARRANGE: Lindsay Wong was educated on the importance of frequent visits to treat obesity as outlined per CMS and USPSTF guidelines and agreed to schedule her next follow up appointment today.  Attestation Statements:   Reviewed by clinician on day of visit: allergies, medications, problem list, medical history, surgical history, family history, social history, and previous encounter notes.  ***(delete if time-based billing not used)Time spent on visit including pre-visit chart review and post-visit care and charting was *** minutes.   I, ***, am acting as transcriptionist for ***.  I have reviewed the above documentation for accuracy and completeness, and I agree with the above. -  ***

## 2021-10-03 ENCOUNTER — Ambulatory Visit (INDEPENDENT_AMBULATORY_CARE_PROVIDER_SITE_OTHER): Payer: No Typology Code available for payment source | Admitting: Family Medicine

## 2021-10-03 NOTE — Patient Instructions (Signed)
3

## 2021-10-04 ENCOUNTER — Encounter: Payer: Self-pay | Admitting: *Deleted

## 2021-10-06 ENCOUNTER — Other Ambulatory Visit (HOSPITAL_COMMUNITY): Payer: Self-pay

## 2021-10-09 ENCOUNTER — Ambulatory Visit: Payer: No Typology Code available for payment source | Admitting: Neurology

## 2021-10-09 ENCOUNTER — Encounter: Payer: Self-pay | Admitting: Neurology

## 2021-10-09 VITALS — BP 113/79 | HR 77 | Ht 62.5 in | Wt 170.0 lb

## 2021-10-09 DIAGNOSIS — G4719 Other hypersomnia: Secondary | ICD-10-CM

## 2021-10-09 DIAGNOSIS — R351 Nocturia: Secondary | ICD-10-CM

## 2021-10-09 DIAGNOSIS — E669 Obesity, unspecified: Secondary | ICD-10-CM | POA: Diagnosis not present

## 2021-10-09 DIAGNOSIS — R519 Headache, unspecified: Secondary | ICD-10-CM

## 2021-10-09 DIAGNOSIS — R0683 Snoring: Secondary | ICD-10-CM | POA: Diagnosis not present

## 2021-10-09 DIAGNOSIS — Z820 Family history of epilepsy and other diseases of the nervous system: Secondary | ICD-10-CM

## 2021-10-09 NOTE — Progress Notes (Signed)
Subjective:    Patient ID: Lindsay Wong is a 41 y.o. female.  HPI    Huston FoleySaima Jake Goodson, MD, PhD [facility] [address], [address] [address] [place], [place] [address]  Dear Dr. Hassan RowanKoberlein,   I saw your patient, Lindsay Wong, upon your kind request, in my Sleep clinic today for initial consultation of her sleep disorder, in particular, concern for underlying obstructive sleep apnea.  The patient is unaccompanied today.  As you know, Ms. Lindsay Wong is a 41 year old right-handed woman with an underlying medical history of allergies, anemia, anxiety, depression, reflux disease, irritable bowel syndrome, suspected lupus, history of Raynaud's syndrome, vitamin D deficiency, and borderline obesity, who reports snoring and excessive daytime somnolence.  I reviewed your office note from 08/06/2021.  She has been followed by medical weight management and was encouraged to be evaluated for sleep apnea. Her Epworth sleepiness score is 10 out of 24, fatigue severity 54 out of 63.  She lives with her husband and 3 sons, ages 6619, 6616 and 4112.  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  She goes to bed generally around 10 PM or 10:30 PM and rise time is generally around 6 AM.  She has nocturia about 2-3 times per average night and has woken up with a headache occasionally, sometimes migraine-like, sometimes dull, achy.  She typically does not take any medication for this.  She drinks caffeine in the form of soda, about 2 cans/day.  She drinks alcohol rarely, on special occasions.  She is a non-smoker.  Her maternal aunt who is her mom's twin sister has sleep apnea and she has a niece with sleep apnea on a CPAP machine.  Patient's sister was diagnosed with narcolepsy several years ago.  They have 1 dog in the household and 4 inside/outside cats.   Her Past Medical History Is Significant For: Past Medical History:  Diagnosis Date   Allergy    Anemia    Anxiety and depression    per  patient diagnosed by psychiatrist   Appendicitis 07/20/2012   Arthritis    Bilateral swelling of feet    Blood in stool    Chest pain    Chicken pox    Chronic fatigue syndrome    Constipation    Depression    postpartum with one child   Essential hypertension 07/13/2021   Fatty liver    GERD 01/31/2007   Qualifier: Diagnosis of  By: [name] RMA, [name]     Hx of degenerative disc disease    Hyperlipidemia    postpartum   IBS (irritable bowel syndrome)    Iron deficiency anemia, unspecified    Osteoarthritis    Palpitations    PONV (postoperative nausea and vomiting)    Positive ANA (antinuclear antibody)    RAYNAUD'S DISEASE 01/31/2007   Qualifier: Diagnosis of  By: [name] RMA, [name]     Urticaria    Vitamin D deficiency     Her Past Surgical History Is Significant For: Past Surgical History:  Procedure Laterality Date   ANTERIOR FUSION CERVICAL SPINE  07/2020   CESAREAN SECTION     x3   COLONOSCOPY  01/2020   LAPAROSCOPIC APPENDECTOMY Right 07/20/2012   Procedure: APPENDECTOMY LAPAROSCOPIC;  Surgeon: [name], MD;  Location: [facility];  Service: General;  Laterality: Right;   WISDOM TOOTH EXTRACTION      Her Family History Is Significant For: Family History  Problem Relation Age of Onset  Hyperlipidemia Mother    Early menopause Mother    Depression Mother    Anxiety disorder Mother    Other Father        no contact since infant   Alcoholism Father    Drug abuse Father    Raynaud syndrome Sister    Cancer Maternal Grandmother        melanoma   Stroke Maternal Grandmother 78   Arthritis Maternal Grandmother    Hyperlipidemia Maternal Grandmother    Hypertension Maternal Grandmother    Breast cancer Maternal Grandmother 82   Cancer Maternal Grandfather        bladder   Bladder Cancer Maternal Grandfather    Autism Son    Healthy Son    Healthy Son    Healthy Son    Immunodeficiency Maternal Aunt    Heart attack Maternal Aunt 49   CAD Maternal Aunt     Sleep apnea Maternal Aunt    Lupus Maternal Uncle    Leukemia Maternal Uncle    Multiple sclerosis Cousin    Lung cancer Other        all but one of grandmother's siblings    Sleep apnea Niece    Colon cancer Neg Hx    Esophageal cancer Neg Hx    Stomach cancer Neg Hx    Rectal cancer Neg Hx     Her Social History Is Significant For: Social History   Socioeconomic History   Marital status: Married    Spouse name: Not on file   Number of children: 3   Years of education: Not on file   Highest education level: Bachelor's degree (e.g., BA, AB, BS)  Occupational History   Occupation: Stay at home spouse  Tobacco Use   Smoking status: Never    Passive exposure: Past   Smokeless tobacco: Never  Vaping Use   Vaping Use: Never used  Substance and Sexual Activity   Alcohol use: No   Drug use: No   Sexual activity: Yes    Birth control/protection: OCP    Comment: husband had vasectomy  Other Topics Concern   Not on file  Social History Narrative   Work or School: homemaker, 3 kids - 9,7 and 3 (2014)      Home Situation: lives with husband and children      Spiritual Beliefs: none      Lifestyle: hot yoga (used to), weights and cardio, tries to eat a healthy diet      Lives in a 2 story home      Right handed      Caffeine: 24-36 oz pepsi zero per day    Social Determinants of Health   Financial Resource Strain: Low Risk    Difficulty of Paying Living Expenses: Not hard at all  Food Insecurity: No Food Insecurity   Worried About Programme researcher, broadcasting/film/video in the Last Year: Never true   Ran Out of Food in the Last Year: Never true  Transportation Needs: No Transportation Needs   Lack of Transportation (Medical): No   Lack of Transportation (Non-Medical): No  Physical Activity: Insufficiently Active   Days of Exercise per Week: 3 days   Minutes of Exercise per Session: 40 min  Stress: No Stress Concern Present   Feeling of Stress : Only a little  Social Connections:  Moderately Integrated   Frequency of Communication with Friends and Family: More than three times a week   Frequency of Social Gatherings with Friends and Family:  Once a week   Attends Religious Services: More than 4 times per year   Active Member of Clubs or Organizations: No   Attends Engineer, structural: Not on file   Marital Status: Married    Her Allergies Are:  Allergies  Allergen Reactions   Clarithromycin Shortness Of Breath    REACTION: hives   Peg-2000 (Covid-19 Mrna Vaccine Component) Anaphylaxis   Latex     Skin irritation from gloves  :   Her Current Medications Are:  Outpatient Encounter Medications as of 10/09/2021  Medication Sig   amLODipine (NORVASC) 2.5 MG tablet Take 1 tablet (2.5 mg total) by mouth daily.   betamethasone dipropionate (DIPROLENE) 0.05 % ointment Apply to rash spots on body two weeks on, two weeks off as needed for flares.   Calcium-Magnesium-Vitamin D (CITRACAL SLOW RELEASE PO) Take by mouth.   esomeprazole (NEXIUM) 40 MG capsule TAKE 1 CAPSULE BY MOUTH ONCE A DAY   Ferrous Sulfate (IRON PO) Take by mouth 3 (three) times a week.   hydroxychloroquine (PLAQUENIL) 200 MG tablet TAKE 1 TABLET BY MOUTH 2 TIMES DAILY ON MONDAY THROUGH FRIDAY   levocetirizine (XYZAL) 5 MG tablet TAKE 1 TABLET BY MOUTH EVERY EVENING AS NEEDED FOR ITCHING (Patient taking differently: Take 5 mg by mouth 2 (two) times daily.)   metFORMIN (GLUCOPHAGE) 500 MG tablet Take 1 tablet by mouth daily with dinner   Norethindrone-Ethinyl Estradiol-Fe Biphas (LO LOESTRIN FE) 1 MG-10 MCG / 10 MCG tablet Take 1 tablet every day by mouth.   VITAMIN D PO Take 2,000 Units by mouth daily.    No facility-administered encounter medications on file as of 10/09/2021.  :   Review of Systems:  Out of a complete 14 point review of systems, all are reviewed and negative with the exception of these symptoms as listed below:  Review of Systems  Neurological:        Patient is here  alone for sleep consult. She states she wakes up a lot during the night. She has snoring, daytime sleepiness and morning headaches. Has been going on for quite awhile and she goes through spells as well. She has an autoimmune disorder (suspect lupus) and doesn't know what could be related. She takes Plaquenil which helps. ESS 10 FSS 54.   Objective:  Neurological Exam  Physical Exam Physical Examination:   Vitals:   10/09/21 1339  BP: 113/79  Pulse: 77    General Examination: The patient is a very pleasant 41 y.o. female in no acute distress. She appears well-developed and well-nourished and well groomed.   HEENT: Normocephalic, atraumatic, pupils are equal, round and reactive to light, extraocular tracking is good without limitation to gaze excursion or nystagmus noted. Hearing is grossly intact. Face is symmetric with normal facial animation. Speech is clear with no dysarthria noted. There is no hypophonia. There is no lip, neck/head, jaw or voice tremor. Neck is supple with full range of passive and active motion. There are no carotid bruits on auscultation. Oropharynx exam reveals:  mouth dryness,  No significant mouth dryness, good dental hygiene, dent mild airway crowding secondary to small airway entry, smaller tonsils noted, slightly elongated uvula.  Mallampati class II.  Neck circumference 15-1/4 inches.  Minimal overbite noted.  Tongue protrudes centrally and palate elevates symmetrically.    Chest: Clear to auscultation without wheezing, rhonchi or crackles noted.  Heart: S1+S2+0, regular and normal without murmurs, rubs or gallops noted.   Abdomen: Soft, non-tender and non-distended with  normal bowel sounds appreciated on auscultation.  Extremities: There is no pitting edema in the distal lower extremities bilaterally.   Skin: Warm and dry without trophic changes noted.   Musculoskeletal: exam reveals no obvious joint deformities, tenderness or joint swelling or erythema.    Neurologically:  Mental status: The patient is awake, alert and oriented in all 4 spheres. Her immediate and remote memory, attention, language skills and fund of knowledge are appropriate. There is no evidence of aphasia, agnosia, apraxia or anomia. Speech is clear with normal prosody and enunciation. Thought process is linear. Mood is normal and affect is normal.  Cranial nerves II - XII are as described above under HEENT exam.  Motor exam: Normal bulk, strength and tone is noted. There is no tremor, Romberg is negative. Reflexes are 2+ throughout. Fine motor skills and coordination: grossly intact.  Cerebellar testing: No dysmetria or intention tremor. There is no truncal or gait ataxia.  Sensory exam: intact to light touch in the upper and lower extremities.  Gait, station and balance: She stands easily. No veering to one side is noted. No leaning to one side is noted. Posture is age-appropriate and stance is narrow based. Gait shows normal stride length and normal pace. No problems turning are noted.  Assessment and Plan:  In summary, ANIQUE BECKLEY is a very pleasant 41 y.o.-year old female with an underlying medical history of allergies, anemia, anxiety, depression, reflux disease, irritable bowel syndrome, suspected lupus, history of Raynaud's syndrome, vitamin D deficiency, and borderline obesity, whose history and physical exam are concerning for obstructive sleep apnea (OSA). I had a long chat with the patient about my findings and the diagnosis of OSA, its prognosis and treatment options. We talked about medical treatments, surgical interventions and non-pharmacological approaches. I explained in particular the risks and ramifications of untreated moderate to severe OSA, especially with respect to developing cardiovascular disease down the Road, including congestive heart failure, difficult to treat hypertension, cardiac arrhythmias, or stroke. Even type 2 diabetes has, in part, been linked  to untreated OSA. Symptoms of untreated OSA include daytime sleepiness, memory problems, mood irritability and mood disorder such as depression and anxiety, lack of energy, as well as recurrent headaches, especially morning headaches. We talked about trying to maintain a healthy lifestyle in general, as well as the importance of weight control. We also talked about the importance of good sleep hygiene. I recommended the following at this time: sleep study.  I outlined the differences between a laboratory attended sleep study versus home sleep testing. I explained the sleep test procedure to the patient and also outlined possible surgical and non-surgical treatment options of OSA, including the use of a custom-made dental device (which would require a referral to a specialist dentist or oral surgeon), upper airway surgical options, such as traditional UPPP or a novel less invasive surgical option in the form of Inspire hypoglossal nerve stimulation (which would involve a referral to an ENT surgeon). I also explained the CPAP treatment option to the patient, who indicated that she would be willing to try PAP therapy, if the need arises. I explained the importance of being compliant with PAP treatment, not only for insurance purposes but primarily to improve Her symptoms, and for the patient's long term health benefit, including to reduce Her cardiovascular risks. I answered all her questions today and the patient was in agreement. I plan to see her back after the sleep study is completed and encouraged her to call with any  interim questions, concerns, problems or updates.   Thank you very much for allowing me to participate in the care of this nice patient. If I can be of any further assistance to you please do not hesitate to call me at [phone].  Sincerely,   Star Age, MD, PhD

## 2021-10-09 NOTE — Patient Instructions (Signed)

## 2021-10-18 ENCOUNTER — Ambulatory Visit (INDEPENDENT_AMBULATORY_CARE_PROVIDER_SITE_OTHER): Payer: No Typology Code available for payment source | Admitting: Adult Health

## 2021-10-31 ENCOUNTER — Telehealth: Payer: Self-pay | Admitting: Neurology

## 2021-10-31 NOTE — Telephone Encounter (Signed)
[facility] is no longer active- it termed on 10/11/21. I sent a mychart message to the patient to see if she has new insurance.

## 2021-11-05 ENCOUNTER — Other Ambulatory Visit: Payer: Self-pay | Admitting: Physician Assistant

## 2021-11-05 ENCOUNTER — Other Ambulatory Visit (HOSPITAL_COMMUNITY): Payer: Self-pay

## 2021-11-05 MED ORDER — HYDROXYCHLOROQUINE SULFATE 200 MG PO TABS
ORAL_TABLET | ORAL | 0 refills | Status: DC
  Filled 2021-11-05: qty 120, 84d supply, fill #0

## 2021-11-08 ENCOUNTER — Ambulatory Visit (INDEPENDENT_AMBULATORY_CARE_PROVIDER_SITE_OTHER): Payer: Self-pay | Admitting: Adult Health

## 2021-11-08 ENCOUNTER — Other Ambulatory Visit (HOSPITAL_COMMUNITY): Payer: Self-pay

## 2021-12-07 IMAGING — US US PELVIS COMPLETE WITH TRANSVAGINAL
1 series · 14 of 25 positions shown · non-contrast
Comparison: None

CLINICAL DATA: Menometrorrhagia, LMP 12/11/2020

EXAM:
TRANSABDOMINAL AND TRANSVAGINAL ULTRASOUND OF PELVIS
TECHNIQUE: Both transabdominal and transvaginal ultrasound examinations of the
pelvis were performed. Transabdominal technique was performed for
global imaging of the pelvis including uterus, ovaries, adnexal
regions, and pelvic cul-de-sac. It was necessary to proceed with
endovaginal exam following the transabdominal exam to visualize the
endometrium and ovaries.

[Series 1: us pelvis complete with transvaginal · 0.25mm/px · 14 of 58 slices shown]
[im 1/58]
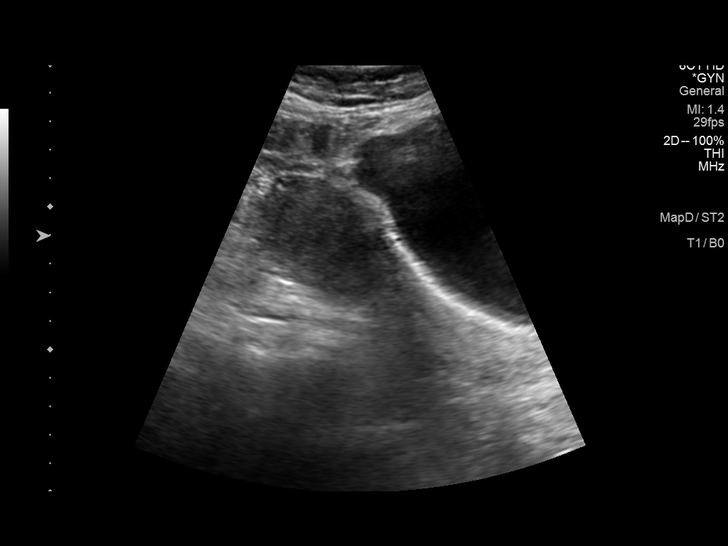
[im 5/58]
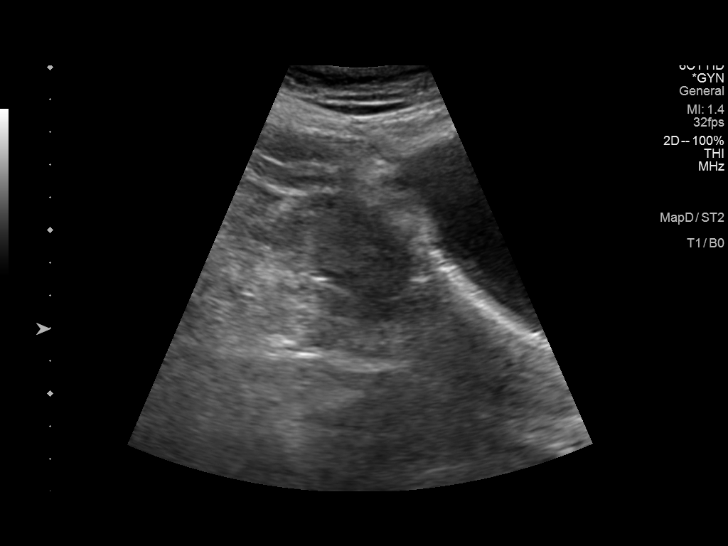
[im 10/58]
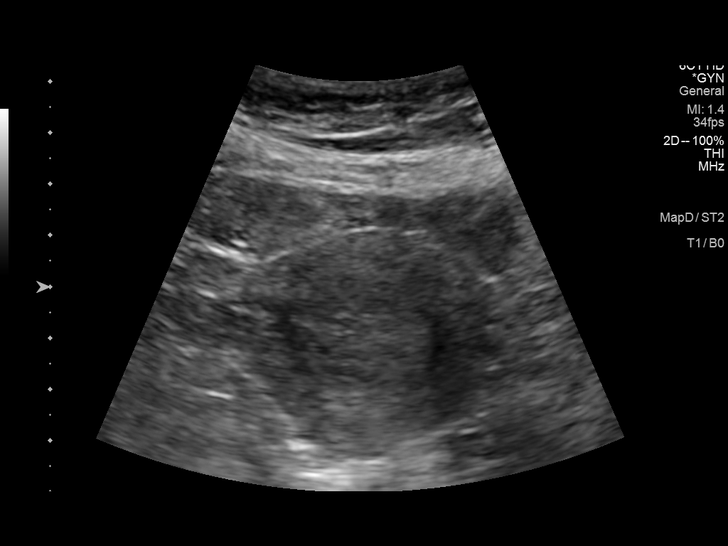
[im 15/58]
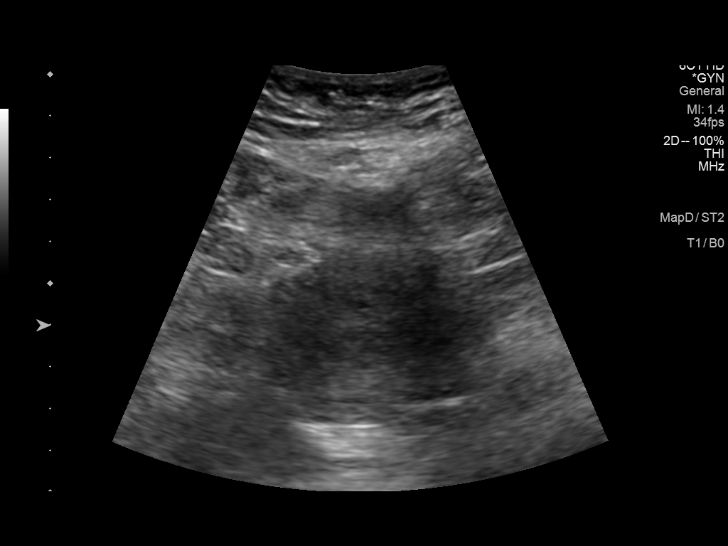
[im 20/58]
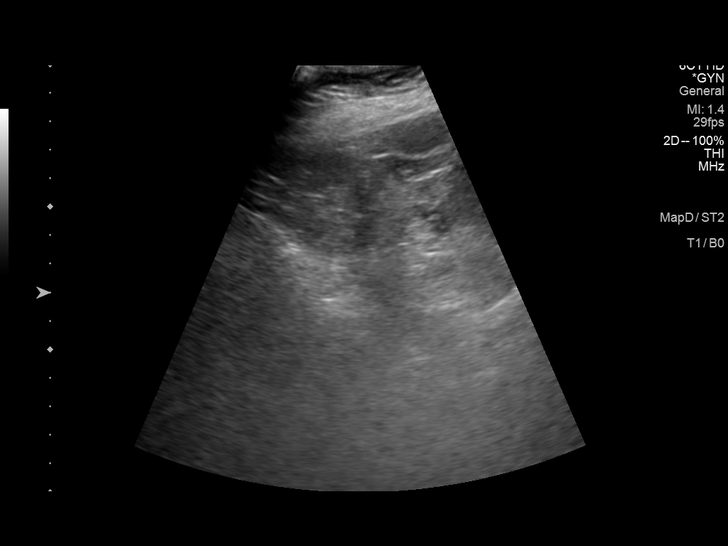
[im 22/58]
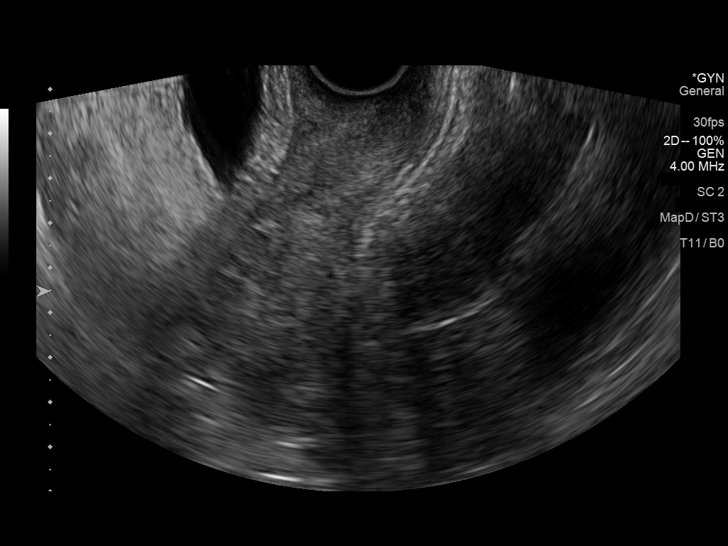
[im 27/58]
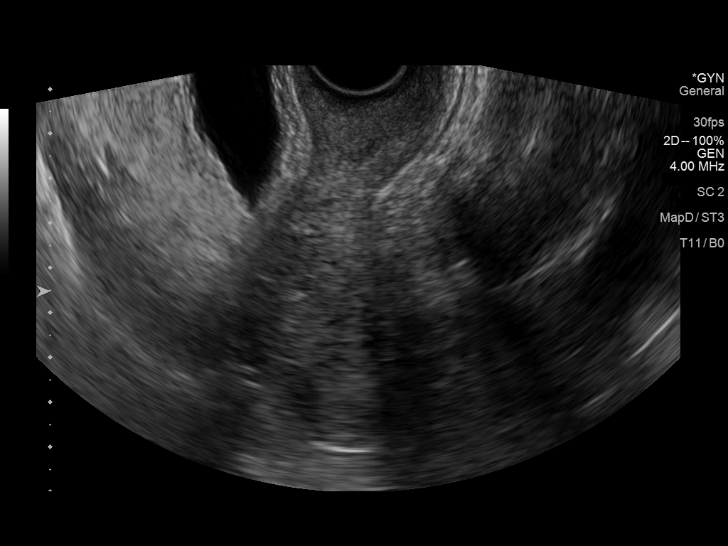
[im 31/58]
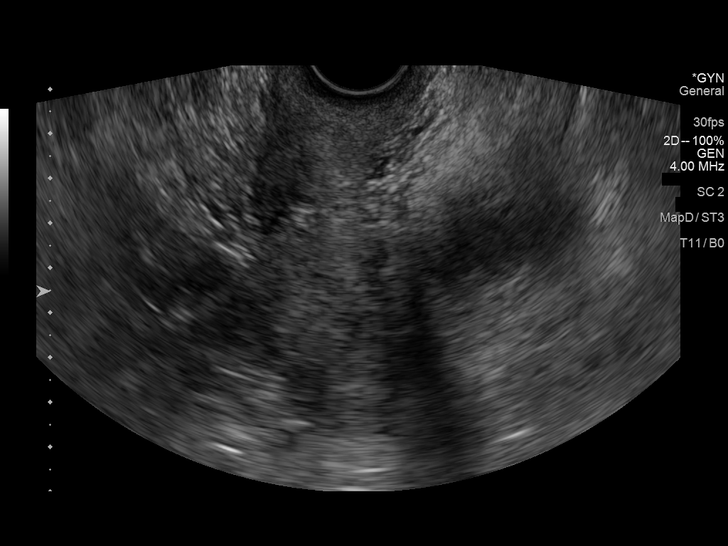
[im 36/58]
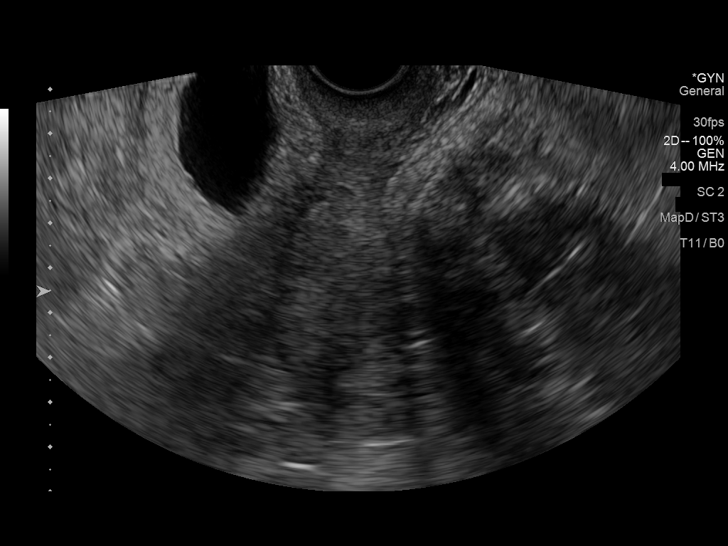
[im 39/58]
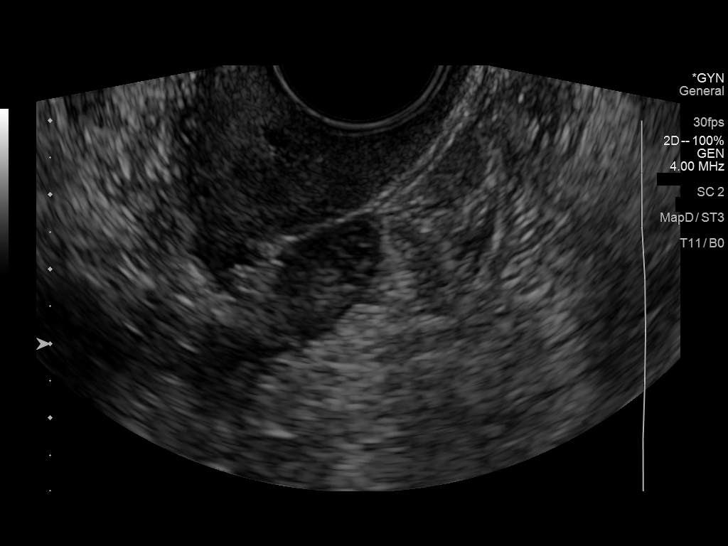
[im 43/58]
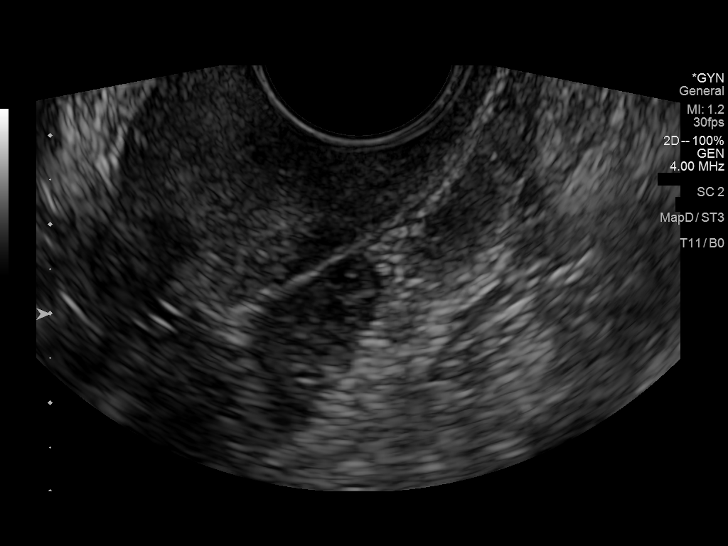
[im 48/58]
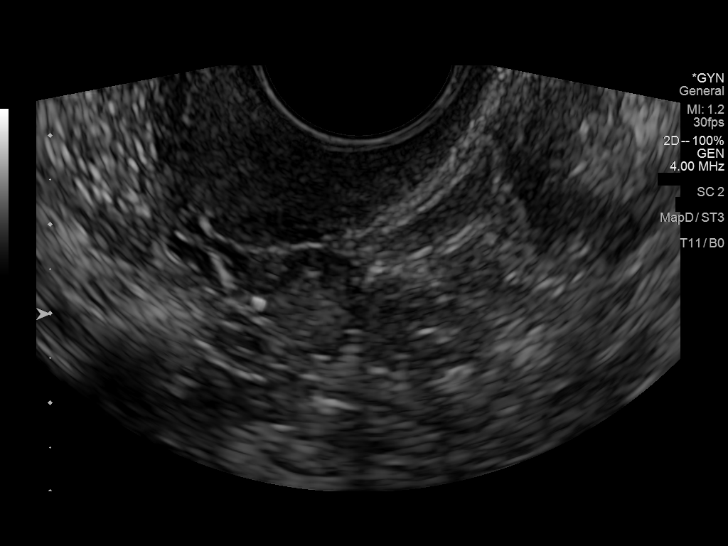
[im 53/58]
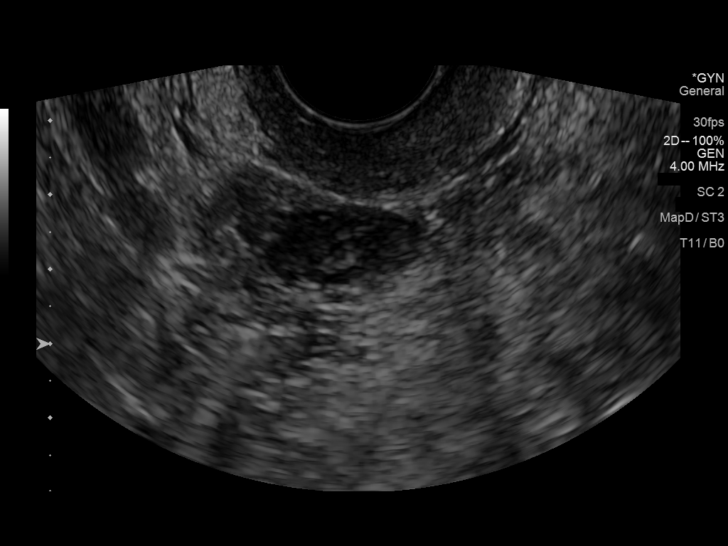
[im 58/58]
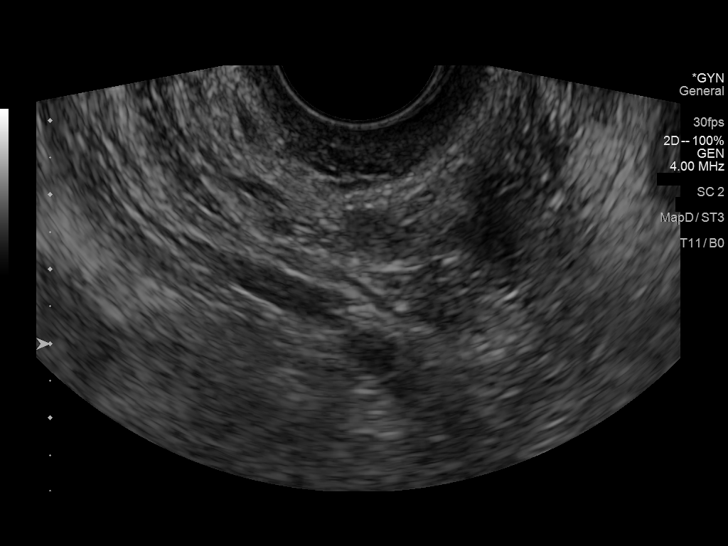

[14 of 25 positions shown; findings below may reference images not displayed]

FINDINGS: Uterus

Measurements: 11.2 x 4.0 x 4.9 cm = volume: 110 mL. Mildly
retroflexed. Normal morphology without mass

Endometrium

Thickness: 5 mm.  No endometrial fluid or focal abnormality

Right ovary

Measurements: 2.0 x 1.3 x 1.4 cm = volume: 1.9 mL. Normal morphology
without mass

Left ovary

Not visualized, likely obscured by bowel

Other findings

No free pelvic fluid.  No adnexal masses.
IMPRESSION: Nonvisualization of LEFT ovary.

Otherwise normal exam.

## 2021-12-12 ENCOUNTER — Ambulatory Visit: Payer: No Typology Code available for payment source | Admitting: Physician Assistant

## 2021-12-19 ENCOUNTER — Encounter (INDEPENDENT_AMBULATORY_CARE_PROVIDER_SITE_OTHER): Payer: Self-pay

## 2021-12-25 NOTE — Progress Notes (Deleted)
Office Visit Note  Patient: BIRDIA JAYCOX             Date of Birth: [birth year]           MRN: [id number]             PCP: Caren Macadam, MD (Inactive) Referring: Caren Macadam, MD Visit Date: 01/08/2022 Occupation: _0 @  Subjective:    History of Present Illness: SENAYA DICENSO is a 41 y.o. female with history of autoimmune disease and osteoarthritis.  She is taking plaquenil 200 mg 1 tablet by mouth twice daily Monday through Friday.   CBC and CMP updated on 07/10/21. Orders for CBC and CMP released today.  PLQ Eye Exam: 03/28/2021 WNL @ [facility] Follow up in 1 year.    Lab work from 07/02/2021 was reviewed today in the office: Double-stranded DNA negative, ESR 22, protein creatinine ratio WNL, C3 203, C4 WNL.  The following lab work will be obtained today for further evaluation.   Activities of Daily Living:  Patient reports morning stiffness for *** {minute/hour:19697}.   Patient {ACTIONS;DENIES/REPORTS:21021675::"Denies"} nocturnal pain.  Difficulty dressing/grooming: {ACTIONS;DENIES/REPORTS:21021675::"Denies"} Difficulty climbing stairs: {ACTIONS;DENIES/REPORTS:21021675::"Denies"} Difficulty getting out of chair: {ACTIONS;DENIES/REPORTS:21021675::"Denies"} Difficulty using hands for taps, buttons, cutlery, and/or writing: {ACTIONS;DENIES/REPORTS:21021675::"Denies"}  No Rheumatology ROS completed.   PMFS History:  Patient Active Problem List   Diagnosis Date Noted   Insulin resistance 09/24/2021   Class 1 obesity with serious comorbidity and body mass index (BMI) of 30.0 to 30.9 in adult 09/24/2021   Benign essential hypertension 07/13/2021   Foraminal stenosis of cervical region 07/04/2020   Adverse effect of other viral vaccines, subsequent encounter 01/27/2020   Spinal stenosis of cervical region 12/28/2019   Fatigue 08/20/2019   Anxiety and depression 05/19/2017   Left shoulder pain 07/19/2014   RAYNAUD'S DISEASE 01/31/2007     Past Medical History:  Diagnosis Date   Allergy    Anemia    Anxiety and depression    per patient diagnosed by psychiatrist   Appendicitis 07/20/2012   Arthritis    Bilateral swelling of feet    Blood in stool    Chest pain    Chicken pox    Chronic fatigue syndrome    Constipation    Depression    postpartum with one child   Essential hypertension 07/13/2021   Fatty liver    GERD 01/31/2007   Qualifier: Diagnosis of  By: Marca Ancona RMA, [name]     Hx of degenerative disc disease    Hyperlipidemia    postpartum   IBS (irritable bowel syndrome)    Iron deficiency anemia, unspecified    Osteoarthritis    Palpitations    PONV (postoperative nausea and vomiting)    Positive ANA (antinuclear antibody)    RAYNAUD'S DISEASE 01/31/2007   Qualifier: Diagnosis of  By: Marca Ancona RMA, [name]     Urticaria    Vitamin D deficiency     Family History  Problem Relation Age of Onset   Hyperlipidemia Mother    Early menopause Mother    Depression Mother    Anxiety disorder Mother    Other Father        no contact since infant   Alcoholism Father    Drug abuse Father    Raynaud syndrome Sister    Cancer Maternal Grandmother        melanoma   Stroke Maternal Grandmother 64   Arthritis Maternal Grandmother    Hyperlipidemia Maternal Grandmother  Hypertension Maternal Grandmother    Breast cancer Maternal Grandmother 72   Cancer Maternal Grandfather        bladder   Bladder Cancer Maternal Grandfather    Autism Son    Healthy Son    Healthy Son    Healthy Son    Immunodeficiency Maternal Aunt    Heart attack Maternal Aunt 49   CAD Maternal Aunt    Sleep apnea Maternal Aunt    Lupus Maternal Uncle    Leukemia Maternal Uncle    Multiple sclerosis Cousin    Lung cancer Other        all but one of grandmother's siblings    Sleep apnea Niece    Colon cancer Neg Hx    Esophageal cancer Neg Hx    Stomach cancer Neg Hx    Rectal cancer Neg Hx    Past Surgical History:  Procedure  Laterality Date   ANTERIOR FUSION CERVICAL SPINE  07/2020   CESAREAN SECTION     x3   COLONOSCOPY  01/2020   LAPAROSCOPIC APPENDECTOMY Right 07/20/2012   Procedure: APPENDECTOMY LAPAROSCOPIC;  Surgeon: Earnstine Regal, MD;  Location: [facility];  Service: General;  Laterality: Right;   WISDOM TOOTH EXTRACTION     Social History   Social History Narrative   Work or School: homemaker, 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,7 and 3 (2014)      Home Situation: lives with husband and children      Spiritual Beliefs: none      Lifestyle: hot yoga (used to), weights and cardio, tries to eat a healthy diet      Lives in a 2 story home      Right handed      Caffeine: 24-36 oz pepsi zero per day    Immunization History  Administered Date(s) Administered   Influenza Split 01/04/2011   Influenza Whole 04/07/2006, 02/01/2008   Influenza,inj,Quad PF,6+ Mos 01/14/2014, 02/08/2015, 02/20/2016, 02/12/2017, 02/13/2018, 03/01/2019, 03/05/2021   Influenza-Unspecified 01/11/2013, 02/11/2016, 03/13/2020   Janssen (J&J) SARS-COV-2 Vaccination 01/28/2020, 04/29/2020   Tdap 03/01/2019     Objective: Vital Signs: There were no vitals taken for this visit.   Physical Exam Vitals and nursing note reviewed.  Constitutional:      Appearance: She is well-developed.  HENT:     Head: Normocephalic and atraumatic.  Eyes:     Conjunctiva/sclera: Conjunctivae normal.  Cardiovascular:     Rate and Rhythm: Normal rate and regular rhythm.     Heart sounds: Normal heart sounds.  Pulmonary:     Effort: Pulmonary effort is normal.     Breath sounds: Normal breath sounds.  Abdominal:     General: Bowel sounds are normal.     Palpations: Abdomen is soft.  Musculoskeletal:     Cervical back: Normal range of motion.  Skin:    General: Skin is warm and dry.     Capillary Refill: Capillary refill takes less than 2 seconds.  Neurological:     Mental Status: She is alert and oriented to person, place, and time.  Psychiatric:         Behavior: Behavior normal.      Musculoskeletal Exam: ***  CDAI Exam: CDAI Score: -- Patient Global: --; Provider Global: -- Swollen: --; Tender: -- Joint Exam 01/08/2022   No joint exam has been documented for this visit   There is currently no information documented on the homunculus. Go to the Rheumatology activity and complete the homunculus joint exam.  Investigation:  No additional findings.  Imaging: No results found.  Recent Labs: Lab Results  Component Value Date   WBC 7.7 07/10/2021   HGB 13.5 07/10/2021   PLT 392 07/10/2021   NA 138 07/10/2021   K 3.9 07/10/2021   CL 103 07/10/2021   CO2 25 07/10/2021   GLUCOSE 124 (H) 07/10/2021   BUN 11 07/10/2021   CREATININE 0.94 07/10/2021   BILITOT 0.4 07/10/2021   ALKPHOS 76 06/30/2020   AST 14 07/10/2021   ALT 15 07/10/2021   PROT 7.5 07/10/2021   ALBUMIN 4.3 06/30/2020   CALCIUM 9.2 07/10/2021   GFRAA 119 06/19/2020    Speciality Comments: PLQ Eye Exam: 03/28/2021 WNL @ [facility] Follow up in 1 year  Procedures:  No procedures performed Allergies: Clarithromycin, Peg-2000 (covid-19 mrna vaccine component), and Latex   Assessment / Plan:     Visit Diagnoses: No diagnosis found.  Orders: No orders of the defined types were placed in this encounter.  No orders of the defined types were placed in this encounter.   Face-to-face time spent with patient was *** minutes. Greater than 50% of time was spent in counseling and coordination of care.  Follow-Up Instructions: No follow-ups on file.   Earnestine Mealing, CMA  Note - This record has been created using Editor, commissioning.  Chart creation errors have been sought, but may not always  have been located. Such creation errors do not reflect on  the standard of medical care.

## 2022-01-08 ENCOUNTER — Ambulatory Visit: Payer: Self-pay | Admitting: Physician Assistant

## 2022-01-08 DIAGNOSIS — M359 Systemic involvement of connective tissue, unspecified: Secondary | ICD-10-CM

## 2022-01-08 DIAGNOSIS — Z79899 Other long term (current) drug therapy: Secondary | ICD-10-CM

## 2022-01-08 DIAGNOSIS — R21 Rash and other nonspecific skin eruption: Secondary | ICD-10-CM

## 2022-01-08 DIAGNOSIS — M249 Joint derangement, unspecified: Secondary | ICD-10-CM

## 2022-01-08 DIAGNOSIS — Z8719 Personal history of other diseases of the digestive system: Secondary | ICD-10-CM

## 2022-01-08 DIAGNOSIS — F32A Depression, unspecified: Secondary | ICD-10-CM

## 2022-01-08 DIAGNOSIS — O1493 Unspecified pre-eclampsia, third trimester: Secondary | ICD-10-CM

## 2022-01-08 DIAGNOSIS — Z820 Family history of epilepsy and other diseases of the nervous system: Secondary | ICD-10-CM

## 2022-01-08 DIAGNOSIS — R5383 Other fatigue: Secondary | ICD-10-CM

## 2022-01-08 DIAGNOSIS — Z8669 Personal history of other diseases of the nervous system and sense organs: Secondary | ICD-10-CM

## 2022-01-08 DIAGNOSIS — M5030 Other cervical disc degeneration, unspecified cervical region: Secondary | ICD-10-CM

## 2022-01-08 DIAGNOSIS — I7300 Raynaud's syndrome without gangrene: Secondary | ICD-10-CM

## 2022-01-08 DIAGNOSIS — M170 Bilateral primary osteoarthritis of knee: Secondary | ICD-10-CM

## 2022-01-16 NOTE — Progress Notes (Signed)
Office Visit Note  Patient: Lindsay Wong             Date of Birth: [birth year]           MRN: [id number]             PCP: Mckinley Jewel, MD Referring: Caren Macadam, MD Visit Date: 01/30/2022 Occupation: _0 @  Subjective:  Medication monitoring   History of Present Illness: Lindsay Wong is a 41 y.o. female with history of autoimmune disease and DDD.  Patient is currently taking Plaquenil 200 mg 1 tablet by mouth twice daily Monday through Friday.  She is tolerating Plaquenil without any side effects.  She has not had any signs or symptoms of an autoimmune disease flare recently.  She states that she started to have some more frequent symptoms of Raynaud's with the colder weather temperatures.  She would like to restart on amlodipine seasonally.  She denies any recent facial rashes, hair loss, sores in her mouth or nose, sicca symptoms, or swollen lymph nodes.  She experiences generalized aching in her joints but denies any joint swelling at this time.  She states that she has started walking several days a week for exercise and has noticed some increased discomfort in her knees at the beginning of the walk which typically subsides.    Activities of Daily Living:  Patient reports morning stiffness for a few minutes.   Patient Reports nocturnal pain.  Difficulty dressing/grooming: Denies Difficulty climbing stairs: Denies Difficulty getting out of chair: Denies Difficulty using hands for taps, buttons, cutlery, and/or writing: Denies  Review of Systems  Constitutional:  Positive for fatigue.  HENT:  Negative for mouth sores and mouth dryness.   Eyes:  Negative for dryness.  Respiratory:  Negative for shortness of breath.   Cardiovascular:  Negative for chest pain and palpitations.  Gastrointestinal:  Negative for blood in stool, constipation and diarrhea.  Endocrine: Negative for increased urination.  Genitourinary:  Negative for involuntary urination.   Musculoskeletal:  Positive for joint pain, joint pain, muscle weakness and morning stiffness. Negative for gait problem, joint swelling, myalgias, muscle tenderness and myalgias.  Skin:  Positive for color change, rash and sensitivity to sunlight. Negative for hair loss.  Allergic/Immunologic: Negative for susceptible to infections.  Neurological:  Negative for dizziness and headaches.  Hematological:  Negative for swollen glands.  Psychiatric/Behavioral:  Negative for depressed mood and sleep disturbance. The patient is not nervous/anxious.     PMFS History:  Patient Active Problem List   Diagnosis Date Noted  . Insulin resistance 09/24/2021  . Class 1 obesity with serious comorbidity and body mass index (BMI) of 30.0 to 30.9 in adult 09/24/2021  . Benign essential hypertension 07/13/2021  . Foraminal stenosis of cervical region 07/04/2020  . Adverse effect of other viral vaccines, subsequent encounter 01/27/2020  . Spinal stenosis of cervical region 12/28/2019  . Fatigue 08/20/2019  . Anxiety and depression 05/19/2017  . Left shoulder pain 07/19/2014  . RAYNAUD'S DISEASE 01/31/2007    Past Medical History:  Diagnosis Date  . Allergy   . Anemia   . Anxiety and depression    per patient diagnosed by psychiatrist  . Appendicitis 07/20/2012  . Arthritis   . Bilateral swelling of feet   . Blood in stool   . Chest pain   . Chicken pox   . Chronic fatigue syndrome   . Constipation   . Depression    postpartum with one child  .  Essential hypertension 07/13/2021  . Fatty liver   . GERD 01/31/2007   Qualifier: Diagnosis of  By: Marca Ancona RMA, [name]    . Hx of degenerative disc disease   . Hyperlipidemia    postpartum  . IBS (irritable bowel syndrome)   . Iron deficiency anemia, unspecified   . Osteoarthritis   . Palpitations   . PONV (postoperative nausea and vomiting)   . Positive ANA (antinuclear antibody)   . RAYNAUD'S DISEASE 01/31/2007   Qualifier: Diagnosis of  By:  Marca Ancona RMA, [name]    . Urticaria   . Vitamin D deficiency     Family History  Problem Relation Age of Onset  . Hyperlipidemia Mother   . Early menopause Mother   . Depression Mother   . Anxiety disorder Mother   . Other Father        no contact since infant  . Alcoholism Father   . Drug abuse Father   . Raynaud syndrome Sister   . Cancer Maternal Grandmother        melanoma  . Stroke Maternal Grandmother 69  . Arthritis Maternal Grandmother   . Hyperlipidemia Maternal Grandmother   . Hypertension Maternal Grandmother   . Breast cancer Maternal Grandmother 72  . Cancer Maternal Grandfather        bladder  . Bladder Cancer Maternal Grandfather   . Autism Son   . Healthy Son   . Healthy Son   . Healthy Son   . Immunodeficiency Maternal Aunt   . Heart attack Maternal Aunt 49  . CAD Maternal Aunt   . Sleep apnea Maternal Aunt   . Lupus Maternal Uncle   . Leukemia Maternal Uncle   . Multiple sclerosis Cousin   . Lung cancer Other        all but one of grandmother's siblings   . Sleep apnea Niece   . Colon cancer Neg Hx   . Esophageal cancer Neg Hx   . Stomach cancer Neg Hx   . Rectal cancer Neg Hx    Past Surgical History:  Procedure Laterality Date  . ANTERIOR FUSION CERVICAL SPINE  07/2020  . CESAREAN SECTION     x3  . COLONOSCOPY  01/2020  . LAPAROSCOPIC APPENDECTOMY Right 07/20/2012   Procedure: APPENDECTOMY LAPAROSCOPIC;  Surgeon: Earnstine Regal, MD;  Location: [facility];  Service: General;  Laterality: Right;  . 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  Influenza Split 01/04/2011  . Influenza Whole 04/07/2006, 02/01/2008  . Influenza,inj,Quad PF,6+ Mos 01/14/2014, 02/08/2015, 02/20/2016, 02/12/2017, 02/13/2018, 03/01/2019, 03/05/2021  . Influenza-Unspecified 01/11/2013, 02/11/2016, 03/13/2020  . Janssen (J&J) SARS-COV-2 Vaccination 01/28/2020, 04/29/2020  . Tdap 03/01/2019     Objective: Vital Signs: BP 129/89 (BP Location: Left Arm, Patient Position: Sitting, Cuff Size: Normal)   Pulse 76   Resp 16   Ht 5' 2.5" (1.588 m)   Wt 171 lb 6.4 oz (77.7 kg)  BMI 30.85 kg/m    Physical Exam Vitals and nursing note reviewed.  Constitutional:      Appearance: She is well-developed.  HENT:     Head: Normocephalic and atraumatic.  Eyes:     Conjunctiva/sclera: Conjunctivae normal.  Cardiovascular:     Rate and Rhythm: Normal rate and regular rhythm.     Heart sounds: Normal heart sounds.  Pulmonary:     Effort: Pulmonary effort is normal.     Breath sounds: Normal breath sounds.  Abdominal:     General: Bowel sounds are normal.     Palpations: Abdomen is soft.  Musculoskeletal:     Cervical back: Normal range of motion.  Lymphadenopathy:     Cervical: No cervical adenopathy.  Skin:    General: Skin is warm and dry.     Capillary Refill: Capillary refill takes less than 2 seconds.  Neurological:     Mental Status: She is alert and oriented to person, place, and time.  Psychiatric:        Behavior: Behavior normal.     Musculoskeletal Exam: C-spine, thoracic spine, lumbar spine have good range of motion with no discomfort.  Shoulder joints, elbow joints, wrist joints, MCPs, PIPs, DIPs have good range of motion with no synovitis.  Complete fist formation bilaterally.  Hip joints have good range of motion with no groin pain.  Knee joints have good range of motion with no warmth or effusion.  Ankle joints have good range of motion with no tenderness or joint swelling.  CDAI Exam: CDAI Score: -- Patient Global: --; Provider Global:  -- Swollen: --; Tender: -- Joint Exam 01/30/2022   No joint exam has been documented for this visit   There is currently no information documented on the homunculus. Go to the Rheumatology activity and complete the homunculus joint exam.  Investigation: No additional findings.  Imaging: No results found.  Recent Labs: Lab Results  Component Value Date   WBC 7.7 07/10/2021   HGB 13.5 07/10/2021   PLT 392 07/10/2021   NA 138 07/10/2021   K 3.9 07/10/2021   CL 103 07/10/2021   CO2 25 07/10/2021   GLUCOSE 124 (H) 07/10/2021   BUN 11 07/10/2021   CREATININE 0.94 07/10/2021   BILITOT 0.4 07/10/2021   ALKPHOS 76 06/30/2020   AST 14 07/10/2021   ALT 15 07/10/2021   PROT 7.5 07/10/2021   ALBUMIN 4.3 06/30/2020   CALCIUM 9.2 07/10/2021   GFRAA 119 06/19/2020    Speciality Comments: PLQ Eye Exam: 03/28/2021 WNL @ [facility] Follow up in 1 year  Procedures:  No procedures performed Allergies: Clarithromycin, Peg-2000 (covid-19 mrna vaccine component), and Latex      Assessment / Plan:     Visit Diagnoses: Autoimmune disease ([place]) - ANA 1: 640 speckled pattern, ENA negative, C3-C4 normal, anticardiolipin, beta-2, LA-,  History of fatigue,oral ulcers, arthralgias, photosensitivity, sicca symptoms: She is not experiencing any signs or symptoms of an autoimmune disease flare at this time.  She has clinically been doing well taking Plaquenil 200 mg 1 tablet by mouth twice daily Monday through Friday.  She has been tolerating Plaquenil without any side effects and has not missed any doses recently.  No Malar rash was noted on examination today.  She continues to experience ongoing photosensitivity but tries to avoid direct sun exposure.  No signs of alopecia.  No oral or nasal ulcerations.  She has not been experiencing any sicca symptoms.  She has started to have  an increased frequency of Raynaud's phenomenon in her fingertips which she attributes to colder weather  temperatures.  She would like to restart on amlodipine seasonally.  A new prescription for amlodipine 2.5 mg 1 tablet daily was sent to the pharmacy.  No digital ulcerations or signs of gangrene were noted on examination today.  She is not experiencing any shortness of breath, pleuritic chest pain, or palpitations.  Her lungs were clear to auscultation today. No synovitis noted on examination today.  Autoimmune lab work updated on 07/10/21: ESR 22, C3 203, C4 within normal limits, double-stranded DNA negative, protein creatinine ratio within normal limits.  Labs were not consistent with active disease or a flare. The following lab work will be updated today.  She will remain on Plaquenil as prescribed.  A refill was sent to the pharmacy today.  She was advised to notify us if she develops any signs or symptoms of a flare.  She will follow-up in the office in 5 months or sooner if needed. - Plan: CBC with Differential/Platelet, COMPLETE METABOLIC PANEL WITH GFR, Protein / creatinine ratio, urine, Anti-DNA antibody, double-stranded, C3 and C4, Sedimentation rate, ANA  High risk medication use - Plaquenil 200 mg 1 tablet by mouth twice daily M-F. CBC and CMP updated on 07/10/21.  Orders for CBC and CMP were released today. PLQ Eye Exam: 03/28/2021 WNL @ [facility] Follow up in 1 year.  Patient was advised to call to schedule an updated Plaquenil eye examination.  - Plan: CBC with Differential/Platelet, COMPLETE METABOLIC PANEL WITH GFR  Raynaud's syndrome without gangrene - She has been experiencing an increased frequency of symptoms of Raynaud's phenomenon with colder weather temperatures.  She would like to restart on amlodipine seasonally.  A prescription for amlodipine 2.5 mg 1 tablet daily was sent to the pharmacy today.  She was advised to monitor her blood pressure closely.   She was advised to notify us if she develops any new or worsening symptoms.   Primary osteoarthritis of both  knees - Moderate OA.  She has good range of motion of both knee joints on examination today.  No warmth or effusion was noted.  She has started to walk several days a week for exercise and has noticed some increased discomfort at the beginning of the walk which typically subsides.    DDD (degenerative disc disease), cervical - S/P fusion 03/22 by Dr.Cram.  No symptoms of radiculopathy.   Rash: Followed by Payton Mccallum. Advised to avoid direct sun exposure.   Other fatigue: Stable.  She has been increasing her activity level walking several days per week.   Other medical conditions are listed as follows:  Hypermobility of joint  History of esophageal reflux  Pre-eclampsia in third trimester  Anxiety and depression   History of migraine  Family history of multiple sclerosis  Benign essential hypertension -Advised to monitor blood pressure closely.  Restarting amlodipine 2.5 mg 1 tablet daily for management of Raynaud's.  Plan: amLODipine (NORVASC) 2.5 MG tablet  Orders: Orders Placed This Encounter  Procedures  . CBC with Differential/Platelet  . COMPLETE METABOLIC PANEL WITH GFR  . Protein / creatinine ratio, urine  . Anti-DNA antibody, double-stranded  . C3 and C4  . Sedimentation rate  . ANA   Meds ordered this encounter  Medications  . hydroxychloroquine (PLAQUENIL) 200 MG tablet    Sig: TAKE 1 TABLET BY MOUTH 2 TIMES DAILY ON MONDAY THROUGH FRIDAY    Dispense:  120 tablet  Refill:  0  . amLODipine (NORVASC) 2.5 MG tablet    Sig: Take 1 tablet (2.5 mg total) by mouth daily.    Dispense:  30 tablet    Refill:  0   Follow-Up Instructions: Return in 5 months (on 07/02/2022) for Autoimmune Disease, DDD.   Ofilia Neas, PA-C  Note - This record has been created using Dragon software.  Chart creation errors have been sought, but may not always  have been located. Such creation errors do not reflect on  the standard of medical care.

## 2022-01-18 ENCOUNTER — Other Ambulatory Visit (HOSPITAL_COMMUNITY): Payer: Self-pay

## 2022-01-30 ENCOUNTER — Ambulatory Visit: Payer: No Typology Code available for payment source | Attending: Physician Assistant | Admitting: Physician Assistant

## 2022-01-30 ENCOUNTER — Encounter: Payer: Self-pay | Admitting: Physician Assistant

## 2022-01-30 VITALS — BP 129/89 | HR 76 | Resp 16 | Ht 62.5 in | Wt 171.4 lb

## 2022-01-30 DIAGNOSIS — Z8719 Personal history of other diseases of the digestive system: Secondary | ICD-10-CM

## 2022-01-30 DIAGNOSIS — O1493 Unspecified pre-eclampsia, third trimester: Secondary | ICD-10-CM

## 2022-01-30 DIAGNOSIS — Z79899 Other long term (current) drug therapy: Secondary | ICD-10-CM

## 2022-01-30 DIAGNOSIS — M170 Bilateral primary osteoarthritis of knee: Secondary | ICD-10-CM | POA: Diagnosis not present

## 2022-01-30 DIAGNOSIS — M359 Systemic involvement of connective tissue, unspecified: Secondary | ICD-10-CM | POA: Diagnosis not present

## 2022-01-30 DIAGNOSIS — I10 Essential (primary) hypertension: Secondary | ICD-10-CM

## 2022-01-30 DIAGNOSIS — F32A Depression, unspecified: Secondary | ICD-10-CM

## 2022-01-30 DIAGNOSIS — Z8669 Personal history of other diseases of the nervous system and sense organs: Secondary | ICD-10-CM

## 2022-01-30 DIAGNOSIS — M249 Joint derangement, unspecified: Secondary | ICD-10-CM

## 2022-01-30 DIAGNOSIS — Z820 Family history of epilepsy and other diseases of the nervous system: Secondary | ICD-10-CM

## 2022-01-30 DIAGNOSIS — R5383 Other fatigue: Secondary | ICD-10-CM

## 2022-01-30 DIAGNOSIS — I7300 Raynaud's syndrome without gangrene: Secondary | ICD-10-CM

## 2022-01-30 DIAGNOSIS — M5030 Other cervical disc degeneration, unspecified cervical region: Secondary | ICD-10-CM

## 2022-01-30 DIAGNOSIS — R21 Rash and other nonspecific skin eruption: Secondary | ICD-10-CM

## 2022-01-30 DIAGNOSIS — F419 Anxiety disorder, unspecified: Secondary | ICD-10-CM

## 2022-01-30 MED ORDER — AMLODIPINE BESYLATE 2.5 MG PO TABS: 2.5000 mg | ORAL_TABLET | Freq: Every day | ORAL | 0 refills | Status: DC

## 2022-01-30 MED ORDER — HYDROXYCHLOROQUINE SULFATE 200 MG PO TABS: ORAL_TABLET | ORAL | 0 refills | Status: DC

## 2022-02-01 NOTE — Progress Notes (Signed)
Liver function is elevated.  Please advise patient to avoid all NSAIDs and alcohol use.  Urine protein negative.  Complements are normal.  Sed rate is mildly elevated and stable.  CBC is normal.  Double-stranded is negative.  No change in treatment advised.  Please forward results to her PCP.

## 2022-02-04 LAB — CBC WITH DIFFERENTIAL/PLATELET
Absolute Monocytes: 533 cells/uL (ref 200–950)
Basophils Absolute: 51 cells/uL (ref 0–200)
Basophils Relative: 0.7 %
Eosinophils Absolute: 168 cells/uL (ref 15–500)
Eosinophils Relative: 2.3 %
HCT: 38.8 % (ref 35.0–45.0)
Hemoglobin: 12.7 g/dL (ref 11.7–15.5)
Lymphs Abs: 2088 cells/uL (ref 850–3900)
MCH: 28 pg (ref 27.0–33.0)
MCHC: 32.7 g/dL (ref 32.0–36.0)
MCV: 85.7 fL (ref 80.0–100.0)
MPV: 11.6 fL (ref 7.5–12.5)
Monocytes Relative: 7.3 %
Neutro Abs: 4460 cells/uL (ref 1500–7800)
Neutrophils Relative %: 61.1 %
Platelets: 367 10*3/uL (ref 140–400)
RBC: 4.53 10*6/uL (ref 3.80–5.10)
RDW: 13.6 % (ref 11.0–15.0)
Total Lymphocyte: 28.6 %
WBC: 7.3 10*3/uL (ref 3.8–10.8)

## 2022-02-04 LAB — ANA: Anti Nuclear Antibody (ANA): POSITIVE — AB

## 2022-02-04 LAB — ANTI-NUCLEAR AB-TITER (ANA TITER)
ANA TITER: 1:160 {titer} — ABNORMAL HIGH
ANA Titer 1: 1:160 {titer} — ABNORMAL HIGH

## 2022-02-04 LAB — COMPLETE METABOLIC PANEL WITH GFR
AG Ratio: 1.3 (calc) (ref 1.0–2.5)
ALT: 36 U/L — ABNORMAL HIGH (ref 6–29)
AST: 25 U/L (ref 10–30)
Albumin: 4.7 g/dL (ref 3.6–5.1)
Alkaline phosphatase (APISO): 86 U/L (ref 31–125)
BUN: 14 mg/dL (ref 7–25)
CO2: 24 mmol/L (ref 20–32)
Calcium: 10.1 mg/dL (ref 8.6–10.2)
Chloride: 104 mmol/L (ref 98–110)
Creat: 0.85 mg/dL (ref 0.50–0.99)
Globulin: 3.5 g/dL (calc) (ref 1.9–3.7)
Glucose, Bld: 75 mg/dL (ref 65–99)
Potassium: 4.1 mmol/L (ref 3.5–5.3)
Sodium: 139 mmol/L (ref 135–146)
Total Bilirubin: 0.7 mg/dL (ref 0.2–1.2)
Total Protein: 8.2 g/dL — ABNORMAL HIGH (ref 6.1–8.1)
eGFR: 87 mL/min/{1.73_m2} (ref >=60)

## 2022-02-04 LAB — PROTEIN / CREATININE RATIO, URINE
Creatinine, Urine: 73 mg/dL (ref 20–275)
Protein/Creat Ratio: 55 mg/g creat (ref 24–184)
Protein/Creatinine Ratio: 0.055 mg/mg creat (ref 0.024–0.184)
Total Protein, Urine: 4 mg/dL — ABNORMAL LOW (ref 5–24)

## 2022-02-04 LAB — C3 AND C4
C3 Complement: 195 mg/dL — ABNORMAL HIGH (ref 83–193)
C4 Complement: 37 mg/dL (ref 15–57)

## 2022-02-04 LAB — ANTI-DNA ANTIBODY, DOUBLE-STRANDED: ds DNA Ab: <1 IU/mL

## 2022-02-04 LAB — SEDIMENTATION RATE: Sed Rate: 28 mm/h — ABNORMAL HIGH (ref 0–20)

## 2022-02-28 ENCOUNTER — Other Ambulatory Visit: Payer: Self-pay | Admitting: Physician Assistant

## 2022-02-28 DIAGNOSIS — I10 Essential (primary) hypertension: Secondary | ICD-10-CM

## 2022-02-28 NOTE — Telephone Encounter (Signed)
Next Visit: 07/10/2022  Last Visit: 01/30/2022  Last Fill: 01/30/2022  Dx: Raynaud's phenomenon   Current Dose per office note on 01/30/2022: amlodipine 2.5 mg 1 tablet daily   Okay to refill Amlodipine?

## 2022-02-28 NOTE — Telephone Encounter (Signed)
Next Visit: 07/10/2022  Last Visit: 01/30/2022  Last Fill: 01/30/2022  Dx: Autoimmune disease   Current Dose per office note on 01/30/2022: amlodipine 2.5 mg 1 tablet daily   Okay to refill amlodipine?

## 2022-04-02 ENCOUNTER — Other Ambulatory Visit: Payer: Self-pay | Admitting: Obstetrics and Gynecology

## 2022-04-02 DIAGNOSIS — R928 Other abnormal and inconclusive findings on diagnostic imaging of breast: Secondary | ICD-10-CM

## 2022-05-14 ENCOUNTER — Other Ambulatory Visit: Payer: Self-pay | Admitting: *Deleted

## 2022-05-14 MED ORDER — HYDROXYCHLOROQUINE SULFATE 200 MG PO TABS: ORAL_TABLET | ORAL | 0 refills | Status: AC

## 2022-05-14 NOTE — Telephone Encounter (Signed)
From: Wenda Low To: Office of Ofilia Neas, [place] Sent: 05/12/2022 8:31 PM EST Subject: Medication Renewal Request  Refills have been requested for the following medications:   hydroxychloroquine (PLAQUENIL) 200 MG tablet [place]]  Preferred pharmacy: [facility] [id number] - [place], [address] Delivery method: [employer]

## 2022-05-14 NOTE — Telephone Encounter (Signed)
Next Visit: 07/10/2022  Last Visit: 01/30/2022  Labs: 01/30/2022 Liver function is elevated. CBC is normal   Eye exam: 03/27/2022 WNL   Current Dose per office note 01/30/2022: Plaquenil 200 mg 1 tablet by mouth twice daily M-F.   AS:[id number] disease   Last Fill: 01/30/2022  Okay to refill Plaquenil?

## 2022-07-10 ENCOUNTER — Ambulatory Visit: Payer: No Typology Code available for payment source | Admitting: Rheumatology

## 2022-08-25 ENCOUNTER — Ambulatory Visit (HOSPITAL_COMMUNITY)
Admission: EM | Admit: 2022-08-25 | Discharge: 2022-08-25 | Disposition: A | Discharge status: Home / Self Care | Payer: No Payment, Other | Attending: Psychiatry | Admitting: Psychiatry

## 2022-08-25 DIAGNOSIS — F4522 Body dysmorphic disorder: Secondary | ICD-10-CM

## 2022-08-25 DIAGNOSIS — F32A Depression, unspecified: Secondary | ICD-10-CM | POA: Insufficient documentation

## 2022-08-25 DIAGNOSIS — K589 Irritable bowel syndrome without diarrhea: Secondary | ICD-10-CM | POA: Insufficient documentation

## 2022-08-25 DIAGNOSIS — G9332 Myalgic encephalomyelitis/chronic fatigue syndrome: Secondary | ICD-10-CM | POA: Insufficient documentation

## 2022-08-25 DIAGNOSIS — I10 Essential (primary) hypertension: Secondary | ICD-10-CM | POA: Insufficient documentation

## 2022-08-25 DIAGNOSIS — F419 Anxiety disorder, unspecified: Secondary | ICD-10-CM | POA: Insufficient documentation

## 2022-08-25 MED ORDER — HYDROXYZINE HCL 25 MG PO TABS: 25.0000 mg | ORAL_TABLET | Freq: Three times a day (TID) | ORAL | 0 refills | Status: DC | PRN

## 2022-08-25 MED ORDER — CITALOPRAM HYDROBROMIDE 20 MG PO TABS: 20.0000 mg | ORAL_TABLET | Freq: Every day | ORAL | 0 refills | Status: DC

## 2022-08-25 NOTE — Progress Notes (Signed)
   08/25/22 1059  [facility] Triage Screening (Walk-ins at [facility] only)  How Did You Hear About [place]? Self  What Is the Reason for Your Visit/Call Today? Pt presents to [facility] voluntarily due to anxiety symptoms. 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 Pt is seeking medication for her symptoms. Pt does not have a psychiatrist or therapist at this time but she interested in starting services.  Pt denies SI/HI and AVH.  How Long Has This Been Causing You Problems? <Week  Have You Recently Had Any Thoughts About Hurting Yourself? No  Are You Planning to Commit Suicide/Harm Yourself At This time? No  Have you Recently Had Thoughts About Hurting Someone [name]? No  Are You Planning To Harm Someone At This Time? No  Are you currently experiencing any auditory, visual or other hallucinations? No  Have You Used Any Alcohol or Drugs in the Past 24 Hours? No  Do you have any current medical co-morbidities that require immediate attention? No  Clinician description of patient physical appearance/behavior: casually dressed, tearful, alert  What Do You Feel Would Help You the Most Today? Medication(s);Treatment for Depression or other mood problem  If access to [facility] was not available, would you have sought care in the Emergency Department? No  Determination of Need Routine (7 days)  Options For Referral Outpatient Therapy;Medication Management

## 2022-08-25 NOTE — ED Provider Notes (Signed)
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  Diagnosis:  Final diagnoses:  Anxiety and depression  Body image disorder    History of Present illness: Lindsay Wong is a 42 y.o. female patient presented to [facility] as a walk in  unaccompanied complaints of increased depression and anxiety due to body dysmorphia.   Lindsay Wong, 42 y.o., female patient seen face to face by this provider  and chart reviewed on 08/25/22.  Per chart review patient has been diagnosed with anxiety and depression.  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  She has been prescribed Prozac, Lexapro, Zoloft, and Paxil in the past which were not effective.  The only medication that she has been prescribed that she feels is effective with Celexa.  She has not taken this medication in a couple years. She lives in the home with her spouse and is currently unemployed.  She is denying any substance use.  She is currently not taking any psychotropic medications.  She endorses multiple medical diagnoses including lupus, chronic fatigue syndrome, IBS, arthritis and hypertension.    On evaluation Lindsay Wong reports over the past couple weeks she has felt an increase in her depression and anxiety and contributes this to her body dysmorphia.  She feels that she cannot stay away from the mirror and this is caused her to obsess over her body image throughout the day.  In addition it is causing her to have sleep disturbance and she is only getting roughly 2-3 hours of sleep per night.  She denies any other stressors/triggers.   During evaluation Lindsay Wong is observed sitting in the assessment area in no acute distress.  She is fairly groomed and makes good eye contact.  She is alert/oriented x 4, cooperative, and attentive.  She is anxious and tearful throughout the  assessment.  She endorses depression with feelings of increased worry, tearfulness, agitation, decrease in energy, decrease in sleep and appetite.  Reports her anxiety has gotten so intense that she has thrown up multiple times throughout the day.  She is making an effort to drink fluids to hydrate.  She has a depressed affect.  She denies suicidal ideations.  She endorses a past history of suicidal thoughts and she is hoping that if she gets restarted on her medications today she can get ahead of her depression and anxiety before that occurs.  She verbally contracts for safety and denies any access to firearms/weapons.  She denies HI/AVH. Objectively there is no evidence of psychosis/mania or delusional thinking.  Patient is able to converse coherently, goal directed thoughts, no distractibility, or pre-occupation.  Patient answered question appropriately.    Discussed Celexa 20 mg daily for depression/anxiety and hydroxyzine 25 mg 3 times daily as needed for anxiety.  Discussed medications how to take them and any adverse reactions.  Patient is in agreement.  Educated to abstain from use of illicit substances/drugs and abuse of any medications.If symptoms worsen or do not continue to improve or if the patient becomes actively suicidal or homicidal then it is recommended that the patient return to the closest hospital emergency department, the [facility], or call 911 for further evaluation and treatment, National Suicide Prevention Lifeline 1-800-SUICIDE or (337)716-5153.   Discussed 62, it offers 24/7 access to trained crisis counselors who can help people experiencing mental health-related  distress. People can call or text 988 or chat 988lifeline.org for themselves if symptoms worsen.   Flowsheet Row ED from 08/25/2022 in [facility]  C-SSRS RISK CATEGORY No Risk       Psychiatric Specialty Exam  Presentation  General  Appearance:Appropriate for Environment; Casual  Eye Contact:Good  Speech:Clear and Coherent; Normal Rate  Speech Volume:Normal  Handedness:Right   Mood and Affect  Mood: Anxious; Depressed  Affect: Tearful; Congruent   Thought Process  Thought Processes: Coherent  Descriptions of Associations:Intact  Orientation:Full (Time, Place and Person)  Thought Content:Logical    Hallucinations:None  Ideas of Reference:None  Suicidal Thoughts:No  Homicidal Thoughts:No   Sensorium  Memory: Immediate Good; Recent Good; Remote Good  Judgment: Good  Insight: Good   Executive Functions  Concentration: Good  Attention Span: Good  Recall: Good  Fund of Knowledge: Good  Language: Good   Psychomotor Activity  Psychomotor Activity: Normal   Assets  Assets: Physical Health; Resilience; Social Support; Housing; Health and safety inspector; Desire for Improvement; Communication Skills   Sleep  Sleep: Poor  Number of hours: No data recorded  Physical Exam: Physical Exam Vitals and nursing note reviewed.  Constitutional:      General: She is not in acute distress.    Appearance: Normal appearance. She is well-developed. She is not ill-appearing.  HENT:     Head: Normocephalic and atraumatic.  Eyes:     General:        Right eye: No discharge.        Left eye: No discharge.     Conjunctiva/sclera: Conjunctivae normal.  Cardiovascular:     Rate and Rhythm: Normal rate and regular rhythm.     Heart sounds: No murmur heard. Pulmonary:     Effort: Pulmonary effort is normal. No respiratory distress.     Breath sounds: Normal breath sounds.  Abdominal:     Palpations: Abdomen is soft.     Tenderness: There is no abdominal tenderness.  Musculoskeletal:        General: No swelling. Normal range of motion.     Cervical back: Normal range of motion and neck supple.  Skin:    General: Skin is warm and dry.     Capillary Refill: Capillary refill  takes less than 2 seconds.     Coloration: Skin is not jaundiced or pale.  Neurological:     Mental Status: She is alert and oriented to person, place, and time.  Psychiatric:        Attention and Perception: Attention and perception normal.        Mood and Affect: Mood is anxious and depressed. Affect is tearful.        Speech: Speech normal.        Behavior: Behavior normal. Behavior is cooperative.        Thought Content: Thought content normal.        Cognition and Memory: Cognition normal.        Judgment: Judgment normal.    Review of Systems  Constitutional: Negative.   HENT: Negative.    Eyes: Negative.   Respiratory: Negative.    Cardiovascular: Negative.   Musculoskeletal: Negative.   Skin: Negative.   Neurological: Negative.   Psychiatric/Behavioral:  Positive for depression. The patient is nervous/anxious.    Blood pressure (!) 142/99, pulse (!) 106, temperature 98.6 F (37 C), temperature source Oral, resp. rate 18, SpO2 100 %. There is no height or weight on file to calculate BMI.  Musculoskeletal: Strength & Muscle Tone: within normal limits Gait & Station: normal Patient leans: N/A   [facility] MSE Discharge Disposition for Follow up and Recommendations: Based on my evaluation the patient does not appear to have an emergency medical condition and can be discharged with resources and follow up care in outpatient services for Medication Management and Individual Therapy  Discharge Patient- Discharge recommendations:  Celexa 20 mg daily and hydroxyzine 25 mg 3 times daily as needed-30-day supply sent to patient's pharmacy of choice.   Outpatient Follow up: Please review list of outpatient resources for psychiatry and counseling. Please follow up with your primary care provider for all medical related needs.    Provided resources for  [facility] for outpatient treatment.   Walk in/ Open Access Hours: Monday - Friday 8AM - 11AM (To see  provider and therapist) Friday - 1PM - 4PM (To see therapist only)   [facility] [address] [place], [place] [phone]   Therapy: We recommend that patient participate in individual therapy to address mental health concerns.     Ardis Hughs, NP 08/25/2022, 11:56 AM

## 2022-08-25 NOTE — Discharge Instructions (Addendum)
Discharge recommendations:   Outpatient Follow up: Please review list of outpatient resources for psychiatry and counseling. Please follow up with your primary care provider for all medical related needs.   You are encouraged to follow up with [facility] for outpatient treatment.  Walk in/ Open Access Hours: Monday - Friday 8AM - 11AM (To see provider and therapist) Friday - 1PM - 4PM (To see therapist only)  [facility] [address] [place], [place] [phone]  Therapy: We recommend that patient participate in individual therapy to address mental health concerns.  Safety:   The following safety precautions should be taken:   No sharp objects. This includes scissors, razors, scrapers, and putty knives.   Chemicals should be removed and locked up.   Medications should be removed and locked up.   Weapons should be removed and locked up. This includes firearms, knives and instruments that can be used to cause injury.   The patient should abstain from use of illicit substances/drugs and abuse of any medications.  If symptoms worsen or do not continue to improve or if the patient becomes actively suicidal or homicidal then it is recommended that the patient return to the closest hospital emergency department, the [facility], or call 911 for further evaluation and treatment. National Suicide Prevention Lifeline 1-800-SUICIDE or 1-800-273-8255.  About 988 988 offers 24/7 access to trained crisis counselors who can help people experiencing mental health-related distress. People can call or text 988 or chat 988lifeline.org for themselves or if they are worried about a loved one who may need crisis support.     
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
## 2022-09-02 ENCOUNTER — Encounter (HOSPITAL_COMMUNITY): Payer: Self-pay | Admitting: Psychiatry

## 2022-09-02 ENCOUNTER — Ambulatory Visit (INDEPENDENT_AMBULATORY_CARE_PROVIDER_SITE_OTHER): Payer: No Payment, Other | Admitting: Psychiatry

## 2022-09-02 DIAGNOSIS — F411 Generalized anxiety disorder: Secondary | ICD-10-CM

## 2022-09-02 DIAGNOSIS — F339 Major depressive disorder, recurrent, unspecified: Secondary | ICD-10-CM | POA: Diagnosis not present

## 2022-09-02 DIAGNOSIS — F329 Major depressive disorder, single episode, unspecified: Secondary | ICD-10-CM | POA: Insufficient documentation

## 2022-09-02 DIAGNOSIS — F410 Panic disorder [episodic paroxysmal anxiety] without agoraphobia: Secondary | ICD-10-CM

## 2022-09-02 DIAGNOSIS — F3342 Major depressive disorder, recurrent, in full remission: Secondary | ICD-10-CM | POA: Insufficient documentation

## 2022-09-02 DIAGNOSIS — F4522 Body dysmorphic disorder: Secondary | ICD-10-CM | POA: Insufficient documentation

## 2022-09-02 MED ORDER — PROPRANOLOL HCL 10 MG PO TABS: ORAL_TABLET | ORAL | 1 refills | Status: DC

## 2022-09-02 MED ORDER — HYDROXYZINE HCL 50 MG PO TABS: ORAL_TABLET | ORAL | 1 refills | Status: DC

## 2022-09-02 MED ORDER — SERTRALINE HCL 50 MG PO TABS: ORAL_TABLET | ORAL | 1 refills | Status: DC

## 2022-09-02 NOTE — Patient Instructions (Addendum)
Thank you for attending your appointment today.  -- STOP Celexa at this time -- START Zoloft 25 mg daily for 1 week then INCREASE to 50 mg daily -- START propranolol 10-20 mg twice daily as needed for anxiety/panic attacks -- Continue hydroxyzine 50-75 mg twice daily as needed for anxiety/sleep -- Continue other medications as prescribed.  Please do not make any changes to medications without first discussing with your provider. If you are experiencing a psychiatric emergency, please call 911 or present to your nearest emergency department. Additional crisis, medication management, and therapy resources are included below.  [facility]  [address], [place], [place] [address] [phone] WALK-IN URGENT CARE 24/7 FOR ANYONE [address], [place], [place]  [phone] Fax: [phone] [email or web address] *Interpreters available *Accepts all insurance and uninsured for Urgent Care needs *Accepts Medicaid and uninsured for outpatient treatment (below)      ONLY FOR [facility]  Below:    Outpatient New Patient Assessment/Therapy Walk-ins:        Monday -Thursday 8am until slots are full.        Every Friday 1pm-4pm  (first come, first served)                   New Patient Psychiatry/Medication Management        Monday-Friday 8am-11am (first come, first served)               For all walk-ins we ask that you arrive by 7:15am, because patients will be seen in the order of arrival.

## 2022-09-09 ENCOUNTER — Other Ambulatory Visit: Payer: Self-pay | Admitting: Internal Medicine

## 2022-09-09 ENCOUNTER — Other Ambulatory Visit: Payer: No Typology Code available for payment source

## 2022-09-09 DIAGNOSIS — R1011 Right upper quadrant pain: Secondary | ICD-10-CM

## 2022-09-12 ENCOUNTER — Ambulatory Visit: Payer: Self-pay | Admitting: Surgery

## 2022-09-12 NOTE — H&P (View-Only) (Signed)
   Lindsay Wong [id number]   Referring Provider:  Sun, Yun, MD   Subjective   Chief Complaint: New Consultation (GALLBLADDER)     History of Present Illness:    Very pleasant 42-year-old woman with history of deficiency anemia, anxiety and depression, chronic fatigue, constipation and is with Linzess, hypertension, fatty liver, GERD, history of degenerative disc disease, hyperlipidemia, IBS, osteoarthritis, palpitations, postop nausea and vomiting, positive ANA, Raynaud's disease on Plaquenil, urticaria and vitamin D deficiency presents for evaluation of biliary colic. This past weekend she had episodic right upper quadrant pain radiating to her back.  Saturday this was associated with nausea and vomiting.  The pain was 10 out of 10 but did subside after about an hour.  She has had no prior similar symptoms.  No associated fever or jaundice. Of note, she has lost about 50 pounds on Mounjaro but is no longer taking this. She had an ultrasound done at [facility] on 4/29; images are not available but the report states gallbladder lumen filled with gallstones, wall thickening up to 1 cm, no pericholecystic fluid, negative sonographic Murphy sign.  Bile duct caliber normal.  Additional information includes 1.9 x 1.3 triangular-shaped peripheral area of increased echotexture/hyperechoic focus in the liver likely focal fat, hyperechoic medullary pyramids consider medullary nephrocalcinosis, can be further evaluated with a multiphase CT of the liver Lab work from 4/29 notes a total bilirubin of 0.7, ALT of 70 (her husband states this predates the episode), amylase normal, white count 8.2, hemoglobin 13.9.  Currently she is not having any pain or nausea.  Previous abdominal surgery includes laparoscopic appendectomy in 2014 by Dr. Gerkin, C-section x 3.  Colonoscopy in 2021 by Dr. Pyrtle: Firm distal rectal polyp, 10 mm anal polyp; Path benign in suspected likely large hypertrophied anal  papilla.  Review of Systems: A complete review of systems was obtained from the patient.  I have reviewed this information and discussed as appropriate with the patient.  See HPI as well for other ROS.   Medical History: Past Medical History:  Diagnosis Date   Anemia    Anxiety    Arthritis    GERD (gastroesophageal reflux disease)    Hyperlipidemia    Hypertension     There is no problem list on file for this patient.   Past Surgical History:  Procedure Laterality Date   APPENDECTOMY  2014   Tummy tuck surgery  2015   Anterior fusion cervical spine surgery  2022   CESAREAN SECTION     2004, 2007, 2010     Allergies  Allergen Reactions   Clarithromycin Hives, Rash and Shortness Of Breath    REACTION: hives   Covid-19 Vaccine, Mrna, [id number], Lnp-S ([employer]) Anaphylaxis   Latex Other (See Comments)    Skin irritation from gloves    Current Outpatient Medications on File Prior to Visit  Medication Sig Dispense Refill   esomeprazole (NEXIUM) 40 MG DR capsule Take 1 capsule by mouth once daily     ferrous sulfate 325 (65 FE) MG EC tablet Take by mouth     hydroxychloroquine (PLAQUENIL) 200 mg tablet TAKE 1 TABLET BY MOUTH 2 TIMES DAILY ON MONDAY THROUGH FRIDAY     hydrOXYzine (ATARAX) 50 MG tablet Take 1-2 tablets (50-100 mg) up to twice daily as needed for anxiety (2nd line to propranolol) or sleep.     levocetirizine (XYZAL) 5 MG tablet TAKE 1 TABLET BY MOUTH EVERY EVENING AS NEEDED FOR ITCHING       sertraline (ZOLOFT) 50 MG tablet Take 1/2 tablet (25 mg) daily for 6 days then increase to 1 tablet (50 mg) daily     No current facility-administered medications on file prior to visit.    History reviewed. No pertinent family history.   Social History   Tobacco Use  Smoking Status Never  Smokeless Tobacco Never     Social History   Socioeconomic History   Marital status: Married  Tobacco Use   Smoking status: Never   Smokeless tobacco: Never  Substance and  Sexual Activity   Alcohol use: Never   Drug use: Never   Social Determinants of Health   Financial Resource Strain: Low Risk  (07/12/2021)   Received from [place]   Overall Financial Resource Strain (CARDIA)    Difficulty of Paying Living Expenses: Not hard at all  Food Insecurity: No Food Insecurity (07/12/2021)   Received from [place]   Hunger Vital Sign    Worried About Running Out of Food in the Last Year: Never true    Ran Out of Food in the Last Year: Never true  Transportation Needs: No Transportation Needs (07/12/2021)   Received from [place]   PRAPARE - Transportation    Lack of Transportation (Medical): No    Lack of Transportation (Non-Medical): No  Physical Activity: Insufficiently Active (07/12/2021)   Received from [place]   Exercise Vital Sign    Days of Exercise per Week: 3 days    Minutes of Exercise per Session: 40 min  Stress: No Stress Concern Present (07/12/2021)   Received from [place]   [employer] - Occupational Stress Questionnaire    Feeling of Stress : Only a little   Received from [facility]   Social Network    Objective:    Vitals:   09/12/22 0932 09/12/22 0933  BP: 114/77   Pulse: 81   Temp: 36.8 C (98.2 F)   SpO2: 96%   Weight: 57.3 kg (126 lb 6.4 oz)   Height: 160 cm (5' 3")   PainSc:  0-No pain  PainLoc:  Abdomen    Body mass index is 22.39 kg/m.  Alert and well-appearing No jaundice or scleral icterus Unlabored respirations Abdomen is soft, minimally tender in the right upper quadrant, nondistended  Assessment and Plan:  Diagnoses and all orders for this visit:  Biliary colic  I recommend proceeding with laparoscopic or robotic cholecystectomy with possible cholangiogram.  Discussed the relevant anatomy using a diagram to demonstrate, and went over surgical technique.  Discussed risks of surgery including bleeding, pain, scarring, intraabdominal injury specifically to the common  bile duct and sequelae, subtotal cholecystectomy, bile leak, conversion to open surgery, failure to resolve symptoms, blood clots/ pulmonary embolus, heart attack, pneumonia, stroke, death. Questions welcomed and answered to patient's satisfaction.  Patient wishes to proceed with surgery.   Zailynn Brandel AMANDA Karesa Maultsby, MD      

## 2022-09-12 NOTE — H&P (Signed)
Lindsay Wong [id number]   Referring Provider:  Salli Real, MD   Subjective   Chief Complaint: New Consultation (GALLBLADDER)     History of Present Illness:    Very pleasant 42 year old woman with history of deficiency anemia, anxiety and depression, chronic fatigue, constipation and is with Linzess, hypertension, fatty liver, GERD, history of degenerative disc disease, hyperlipidemia, IBS, osteoarthritis, palpitations, postop nausea and vomiting, positive ANA, Raynaud's disease on Plaquenil, urticaria and vitamin D deficiency presents for evaluation of biliary colic. This past weekend she had episodic right upper quadrant pain radiating to her back.  Saturday this was associated with nausea and vomiting.  The pain was 10 out of 10 but did subside after about an hour.  She has had no prior similar symptoms.  No associated fever or jaundice. Of note, she has lost about 50 pounds on Mounjaro but is no longer taking this. She had an ultrasound done at [facility] on 4/29; images are not available but the report states gallbladder lumen filled with gallstones, wall thickening up to 1 cm, no pericholecystic fluid, negative sonographic Murphy sign.  Bile duct caliber normal.  Additional information includes 1.9 x 1.3 triangular-shaped peripheral area of increased echotexture/hyperechoic focus in the liver likely focal fat, hyperechoic medullary pyramids consider medullary nephrocalcinosis, can be further evaluated with a multiphase CT of the liver Lab work from 4/29 notes a total bilirubin of 0.7, ALT of 70 (her husband states this predates the episode), amylase normal, white count 8.2, hemoglobin 13.9.  Currently she is not having any pain or nausea.  Previous abdominal surgery includes laparoscopic appendectomy in 2014 by Dr. Gerrit Friends, C-section x 3.  Colonoscopy in 2021 by Dr. Rhea Belton: Firm distal rectal polyp, 10 mm anal polyp; Path benign in suspected likely large hypertrophied anal  papilla.  Review of Systems: A complete review of systems was obtained from the patient.  I have reviewed this information and discussed as appropriate with the patient.  See HPI as well for other ROS.   Medical History: Past Medical History:  Diagnosis Date   Anemia    Anxiety    Arthritis    GERD (gastroesophageal reflux disease)    Hyperlipidemia    Hypertension     There is no problem list on file for this patient.   Past Surgical History:  Procedure Laterality Date   APPENDECTOMY  2014   Tummy tuck surgery  2015   Anterior fusion cervical spine surgery  2022   CESAREAN SECTION     2004, 2007, 2010     Allergies  Allergen Reactions   Clarithromycin Hives, Rash and Shortness Of Breath    REACTION: hives   Covid-19 Vaccine, Mrna, [id number], Lnp-S Proof[employer]) Anaphylaxis   Latex Other (See Comments)    Skin irritation from gloves    Current Outpatient Medications on File Prior to Visit  Medication Sig Dispense Refill   esomeprazole (NEXIUM) 40 MG DR capsule Take 1 capsule by mouth once daily     ferrous sulfate 325 (65 FE) MG EC tablet Take by mouth     hydroxychloroquine (PLAQUENIL) 200 mg tablet TAKE 1 TABLET BY MOUTH 2 TIMES DAILY ON MONDAY THROUGH FRIDAY     hydrOXYzine (ATARAX) 50 MG tablet Take 1-2 tablets (50-100 mg) up to twice daily as needed for anxiety (2nd line to propranolol) or sleep.     levocetirizine (XYZAL) 5 MG tablet TAKE 1 TABLET BY MOUTH EVERY EVENING AS NEEDED FOR ITCHING  sertraline (ZOLOFT) 50 MG tablet Take 1/2 tablet (25 mg) daily for 6 days then increase to 1 tablet (50 mg) daily     No current facility-administered medications on file prior to visit.    History reviewed. No pertinent family history.   Social History   Tobacco Use  Smoking Status Never  Smokeless Tobacco Never     Social History   Socioeconomic History   Marital status: Married  Tobacco Use   Smoking status: Never   Smokeless tobacco: Never  Substance and  Sexual Activity   Alcohol use: Never   Drug use: Never   Social Determinants of Health   Financial Resource Strain: Low Risk  (07/12/2021)   Received from [facility]   Overall Financial Resource Strain (CARDIA)    Difficulty of Paying Living Expenses: Not hard at all  Food Insecurity: No Food Insecurity (07/12/2021)   Received from [facility]   Hunger Vital Sign    Worried About Running Out of Food in the Last Year: Never true    Ran Out of Food in the Last Year: Never true  Transportation Needs: No Transportation Needs (07/12/2021)   Received from [facility] - Transportation    Lack of Transportation (Medical): No    Lack of Transportation (Non-Medical): No  Physical Activity: Insufficiently Active (07/12/2021)   Received from [facility]   Exercise Vital Sign    Days of Exercise per Week: 3 days    Minutes of Exercise per Session: 40 min  Stress: No Stress Concern Present (07/12/2021)   Received from [facility] - Occupational Stress Questionnaire    Feeling of Stress : Only a little   Received from [facility]   Social Network    Objective:    Vitals:   09/12/22 0932 09/12/22 0933  BP: 114/77   Pulse: 81   Temp: 36.8 C (98.2 F)   SpO2: 96%   Weight: 57.3 kg (126 lb 6.4 oz)   Height: 160 cm (5\' 3" )   PainSc:  0-No pain  PainLoc:  Abdomen    Body mass index is 22.39 kg/m.  Alert and well-appearing No jaundice or scleral icterus Unlabored respirations Abdomen is soft, minimally tender in the right upper quadrant, nondistended  Assessment and Plan:  Diagnoses and all orders for this visit:  Biliary colic  I recommend proceeding with laparoscopic or robotic cholecystectomy with possible cholangiogram.  Discussed the relevant anatomy using a diagram to demonstrate, and went over surgical technique.  Discussed risks of surgery including bleeding, pain, scarring, intraabdominal injury specifically to the common  bile duct and sequelae, subtotal cholecystectomy, bile leak, conversion to open surgery, failure to resolve symptoms, blood clots/ pulmonary embolus, heart attack, pneumonia, stroke, death. Questions welcomed and answered to patient's satisfaction.  Patient wishes to proceed with surgery.   Azhia Siefken Carlye Grippe, MD

## 2022-09-13 ENCOUNTER — Encounter (HOSPITAL_COMMUNITY): Payer: Self-pay | Admitting: Surgery

## 2022-09-13 ENCOUNTER — Other Ambulatory Visit: Payer: Self-pay

## 2022-09-13 NOTE — Progress Notes (Signed)
For Anesthesia: PCP - Ollen Bowl, MD  Cardiologist - N/A  Chest x-ray - greater than 1 year in [facility] EKG - greater than 1 year in [facility] Stress Test - N/A ECHO - greater than 2 years in [facility] Cardiac Cath - N/A Pacemaker/ICD device last checked:N/A Pacemaker orders received: N/A Device Rep notified: N/A  Spinal Cord Stimulator: N/A  Sleep Study - N/A CPAP - N/A  Fasting Blood Sugar - N/A Checks Blood Sugar ___N/A__ times a day Date and result of last Hgb A1c-N/A  Last dose of GLP1 agonist- N/A GLP1 instructions: N/A  Last dose of SGLT-2 inhibitors- N/A SGLT-2 instructions:N/A  Blood Thinner Instructions: N/A Aspirin Instructions:N/A Last Dose:N/A  Activity level:   Able to exercise without chest pain and/or shortness of breath       Anesthesia review: N/A  Patient denies shortness of breath, fever, cough and chest pain at PAT appointment   Patient verbalized understanding of instructions reviewed via telephone.
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
## 2022-09-17 ENCOUNTER — Ambulatory Visit (HOSPITAL_COMMUNITY): Payer: Self-pay | Admitting: Anesthesiology

## 2022-09-17 ENCOUNTER — Encounter (HOSPITAL_COMMUNITY)
Admission: RE | Disposition: A | Discharge status: Home / Self Care | Payer: Self-pay | Source: Home / Self Care | Attending: Surgery

## 2022-09-17 ENCOUNTER — Other Ambulatory Visit: Payer: Self-pay

## 2022-09-17 ENCOUNTER — Encounter (HOSPITAL_COMMUNITY): Payer: Self-pay | Admitting: Surgery

## 2022-09-17 ENCOUNTER — Ambulatory Visit (HOSPITAL_BASED_OUTPATIENT_CLINIC_OR_DEPARTMENT_OTHER): Payer: Self-pay | Admitting: Anesthesiology

## 2022-09-17 ENCOUNTER — Ambulatory Visit (HOSPITAL_COMMUNITY)
Admission: RE | Admit: 2022-09-17 | Discharge: 2022-09-17 | Disposition: A | Discharge status: Home / Self Care | Payer: Self-pay | Attending: Surgery | Admitting: Surgery

## 2022-09-17 DIAGNOSIS — K760 Fatty (change of) liver, not elsewhere classified: Secondary | ICD-10-CM | POA: Insufficient documentation

## 2022-09-17 DIAGNOSIS — M1990 Unspecified osteoarthritis, unspecified site: Secondary | ICD-10-CM | POA: Insufficient documentation

## 2022-09-17 DIAGNOSIS — Z01818 Encounter for other preprocedural examination: Secondary | ICD-10-CM

## 2022-09-17 DIAGNOSIS — K219 Gastro-esophageal reflux disease without esophagitis: Secondary | ICD-10-CM | POA: Insufficient documentation

## 2022-09-17 DIAGNOSIS — D649 Anemia, unspecified: Secondary | ICD-10-CM

## 2022-09-17 DIAGNOSIS — D509 Iron deficiency anemia, unspecified: Secondary | ICD-10-CM | POA: Insufficient documentation

## 2022-09-17 DIAGNOSIS — I10 Essential (primary) hypertension: Secondary | ICD-10-CM | POA: Insufficient documentation

## 2022-09-17 DIAGNOSIS — K8044 Calculus of bile duct with chronic cholecystitis without obstruction: Secondary | ICD-10-CM | POA: Insufficient documentation

## 2022-09-17 DIAGNOSIS — F419 Anxiety disorder, unspecified: Secondary | ICD-10-CM | POA: Insufficient documentation

## 2022-09-17 DIAGNOSIS — F418 Other specified anxiety disorders: Secondary | ICD-10-CM

## 2022-09-17 DIAGNOSIS — I7300 Raynaud's syndrome without gangrene: Secondary | ICD-10-CM | POA: Insufficient documentation

## 2022-09-17 DIAGNOSIS — E785 Hyperlipidemia, unspecified: Secondary | ICD-10-CM | POA: Insufficient documentation

## 2022-09-17 DIAGNOSIS — K589 Irritable bowel syndrome without diarrhea: Secondary | ICD-10-CM | POA: Insufficient documentation

## 2022-09-17 DIAGNOSIS — Z79899 Other long term (current) drug therapy: Secondary | ICD-10-CM | POA: Insufficient documentation

## 2022-09-17 DIAGNOSIS — F32A Depression, unspecified: Secondary | ICD-10-CM | POA: Insufficient documentation

## 2022-09-17 DIAGNOSIS — K8060 Calculus of gallbladder and bile duct with cholecystitis, unspecified, without obstruction: Secondary | ICD-10-CM | POA: Insufficient documentation

## 2022-09-17 HISTORY — DX: Systemic involvement of connective tissue, unspecified: M35.9

## 2022-09-17 HISTORY — PX: CHOLECYSTECTOMY: SHX55

## 2022-09-17 HISTORY — DX: Other seasonal allergic rhinitis: J30.2

## 2022-09-17 LAB — CBC
HCT: 41.1 % (ref 36.0–46.0)
Hemoglobin: 13.4 g/dL (ref 12.0–15.0)
MCH: 29.7 pg (ref 26.0–34.0)
MCHC: 32.6 g/dL (ref 30.0–36.0)
MCV: 91.1 fL (ref 80.0–100.0)
Platelets: 291 10*3/uL (ref 150–400)
RBC: 4.51 MIL/uL (ref 3.87–5.11)
RDW: 13.3 % (ref 11.5–15.5)
WBC: 9.9 10*3/uL (ref 4.0–10.5)
nRBC: 0 % (ref 0.0–0.2)

## 2022-09-17 LAB — POCT PREGNANCY, URINE: Preg Test, Ur: NEGATIVE

## 2022-09-17 SURGERY — LAPAROSCOPIC CHOLECYSTECTOMY WITH INTRAOPERATIVE CHOLANGIOGRAM
Anesthesia: General

## 2022-09-17 MED ORDER — MIDAZOLAM HCL 5 MG/5ML IJ SOLN
INTRAMUSCULAR | Status: DC | PRN
  Administered 2022-09-17: 2 mg via INTRAVENOUS

## 2022-09-17 MED ORDER — FENTANYL CITRATE PF 50 MCG/ML IJ SOSY
PREFILLED_SYRINGE | INTRAMUSCULAR | Status: AC
  Filled 2022-09-17: qty 1

## 2022-09-17 MED ORDER — ORAL CARE MOUTH RINSE: 15.0000 mL | Freq: Once | OROMUCOSAL | Status: AC

## 2022-09-17 MED ORDER — BUPIVACAINE LIPOSOME 1.3 % IJ SUSP
INTRAMUSCULAR | Status: AC
  Filled 2022-09-17: qty 20

## 2022-09-17 MED ORDER — SCOPOLAMINE 1 MG/3DAYS TD PT72
MEDICATED_PATCH | TRANSDERMAL | Status: AC
  Filled 2022-09-17: qty 1

## 2022-09-17 MED ORDER — PROPOFOL 10 MG/ML IV BOLUS
INTRAVENOUS | Status: AC
  Filled 2022-09-17: qty 20

## 2022-09-17 MED ORDER — HYDROMORPHONE HCL 1 MG/ML IJ SOLN: 0.2500 mg | INTRAMUSCULAR | Status: DC | PRN

## 2022-09-17 MED ORDER — BUPIVACAINE LIPOSOME 1.3 % IJ SUSP
INTRAMUSCULAR | Status: DC | PRN
  Administered 2022-09-17: 30 mL

## 2022-09-17 MED ORDER — PROPOFOL 10 MG/ML IV BOLUS
INTRAVENOUS | Status: DC | PRN
  Administered 2022-09-17: 150 mg via INTRAVENOUS

## 2022-09-17 MED ORDER — SODIUM CHLORIDE 0.9% FLUSH: 3.0000 mL | INTRAVENOUS | Status: DC | PRN

## 2022-09-17 MED ORDER — ROCURONIUM BROMIDE 10 MG/ML (PF) SYRINGE
PREFILLED_SYRINGE | INTRAVENOUS | Status: DC | PRN
  Administered 2022-09-17: 50 mg via INTRAVENOUS

## 2022-09-17 MED ORDER — ACETAMINOPHEN 325 MG PO TABS: 650.0000 mg | ORAL_TABLET | ORAL | Status: DC | PRN

## 2022-09-17 MED ORDER — 0.9 % SODIUM CHLORIDE (POUR BTL) OPTIME
TOPICAL | Status: DC | PRN
  Administered 2022-09-17: 1000 mL

## 2022-09-17 MED ORDER — CHLORHEXIDINE GLUCONATE 4 % EX SOLN: 60.0000 mL | Freq: Once | CUTANEOUS | Status: DC

## 2022-09-17 MED ORDER — SCOPOLAMINE 1 MG/3DAYS TD PT72
1.0000 | MEDICATED_PATCH | TRANSDERMAL | Status: DC
  Administered 2022-09-17: 1.5 mg via TRANSDERMAL

## 2022-09-17 MED ORDER — LACTATED RINGERS IR SOLN
Status: DC | PRN
  Administered 2022-09-17 (×2): 1000 mL

## 2022-09-17 MED ORDER — ONDANSETRON HCL 4 MG/2ML IJ SOLN
INTRAMUSCULAR | Status: AC
  Filled 2022-09-17: qty 2

## 2022-09-17 MED ORDER — SUGAMMADEX SODIUM 200 MG/2ML IV SOLN
INTRAVENOUS | Status: DC | PRN
  Administered 2022-09-17: 150 mg via INTRAVENOUS

## 2022-09-17 MED ORDER — OXYCODONE HCL 5 MG PO TABS: 5.0000 mg | ORAL_TABLET | ORAL | Status: DC | PRN

## 2022-09-17 MED ORDER — LACTATED RINGERS IV SOLN: INTRAVENOUS | Status: DC

## 2022-09-17 MED ORDER — GABAPENTIN 300 MG PO CAPS
300.0000 mg | ORAL_CAPSULE | ORAL | Status: AC
  Administered 2022-09-17: 300 mg via ORAL
  Filled 2022-09-17: qty 1

## 2022-09-17 MED ORDER — BUPIVACAINE LIPOSOME 1.3 % IJ SUSP: 20.0000 mL | Freq: Once | INTRAMUSCULAR | Status: DC

## 2022-09-17 MED ORDER — CHLORHEXIDINE GLUCONATE 0.12 % MT SOLN
15.0000 mL | Freq: Once | OROMUCOSAL | Status: AC
  Administered 2022-09-17: 15 mL via OROMUCOSAL

## 2022-09-17 MED ORDER — FENTANYL CITRATE PF 50 MCG/ML IJ SOSY
25.0000 ug | PREFILLED_SYRINGE | INTRAMUSCULAR | Status: DC | PRN
  Administered 2022-09-17: 50 ug via INTRAVENOUS

## 2022-09-17 MED ORDER — FENTANYL CITRATE (PF) 250 MCG/5ML IJ SOLN
INTRAMUSCULAR | Status: AC
  Filled 2022-09-17: qty 5

## 2022-09-17 MED ORDER — CEFAZOLIN SODIUM-DEXTROSE 2-4 GM/100ML-% IV SOLN
2.0000 g | INTRAVENOUS | Status: AC
  Administered 2022-09-17: 2 g via INTRAVENOUS
  Filled 2022-09-17: qty 100

## 2022-09-17 MED ORDER — SODIUM CHLORIDE 0.9 % IV SOLN: 250.0000 mL | INTRAVENOUS | Status: DC | PRN

## 2022-09-17 MED ORDER — MIDAZOLAM HCL 2 MG/2ML IJ SOLN
INTRAMUSCULAR | Status: AC
  Filled 2022-09-17: qty 2

## 2022-09-17 MED ORDER — ACETAMINOPHEN 650 MG RE SUPP
650.0000 mg | RECTAL | Status: DC | PRN
  Filled 2022-09-17: qty 1

## 2022-09-17 MED ORDER — ONDANSETRON HCL 4 MG/2ML IJ SOLN
INTRAMUSCULAR | Status: DC | PRN
  Administered 2022-09-17: 4 mg via INTRAVENOUS

## 2022-09-17 MED ORDER — BUPIVACAINE HCL (PF) 0.25 % IJ SOLN
INTRAMUSCULAR | Status: AC
  Filled 2022-09-17: qty 30

## 2022-09-17 MED ORDER — LIDOCAINE 2% (20 MG/ML) 5 ML SYRINGE
INTRAMUSCULAR | Status: DC | PRN
  Administered 2022-09-17: 50 mg via INTRAVENOUS

## 2022-09-17 MED ORDER — DEXAMETHASONE SODIUM PHOSPHATE 10 MG/ML IJ SOLN
INTRAMUSCULAR | Status: DC | PRN
  Administered 2022-09-17: 10 mg via INTRAVENOUS

## 2022-09-17 MED ORDER — TRAMADOL HCL 50 MG PO TABS: 50.0000 mg | ORAL_TABLET | Freq: Four times a day (QID) | ORAL | 0 refills | Status: AC | PRN

## 2022-09-17 MED ORDER — FENTANYL CITRATE (PF) 100 MCG/2ML IJ SOLN
INTRAMUSCULAR | Status: DC | PRN
  Administered 2022-09-17: 100 ug via INTRAVENOUS
  Administered 2022-09-17: 50 ug via INTRAVENOUS

## 2022-09-17 MED ORDER — ACETAMINOPHEN 500 MG PO TABS
1000.0000 mg | ORAL_TABLET | ORAL | Status: AC
  Administered 2022-09-17: 1000 mg via ORAL
  Filled 2022-09-17: qty 2

## 2022-09-17 MED ORDER — DOCUSATE SODIUM 100 MG PO CAPS: 100.0000 mg | ORAL_CAPSULE | Freq: Two times a day (BID) | ORAL | 0 refills | Status: AC

## 2022-09-17 MED ORDER — SODIUM CHLORIDE 0.9% FLUSH: 3.0000 mL | Freq: Two times a day (BID) | INTRAVENOUS | Status: DC

## 2022-09-17 SURGICAL SUPPLY — 47 items
ADH SKN CLS APL DERMABOND .7 (GAUZE/BANDAGES/DRESSINGS)
APL PRP STRL LF DISP 70% ISPRP (MISCELLANEOUS) ×1
APL SKNCLS STERI-STRIP NONHPOA (GAUZE/BANDAGES/DRESSINGS)
APPLIER CLIP ROT 10 11.4 M/L (STAPLE) ×1
APR CLP MED LRG 11.4X10 (STAPLE) ×1
BAG COUNTER SPONGE SURGICOUNT (BAG) ×1 IMPLANT
BAG SPNG CNTER NS LX DISP (BAG) ×1
BENZOIN TINCTURE PRP APPL 2/3 (GAUZE/BANDAGES/DRESSINGS) IMPLANT
BNDG ADH 1X3 SHEER STRL LF (GAUZE/BANDAGES/DRESSINGS) IMPLANT
BNDG ADH THN 3X1 STRL LF (GAUZE/BANDAGES/DRESSINGS)
CABLE HIGH FREQUENCY MONO STRZ (ELECTRODE) ×1 IMPLANT
CHLORAPREP W/TINT 26 (MISCELLANEOUS) ×1 IMPLANT
CLIP APPLIE ROT 10 11.4 M/L (STAPLE) ×1 IMPLANT
COVER MAYO STAND XLG (MISCELLANEOUS) IMPLANT
COVER SURGICAL LIGHT HANDLE (MISCELLANEOUS) ×1 IMPLANT
DERMABOND ADVANCED .7 DNX12 (GAUZE/BANDAGES/DRESSINGS) IMPLANT
DRAPE C-ARM 42X120 X-RAY (DRAPES) IMPLANT
ELECT REM PT RETURN 15FT ADLT (MISCELLANEOUS) ×1 IMPLANT
GLOVE BIO SURGEON STRL SZ 6 (GLOVE) ×1 IMPLANT
GLOVE INDICATOR 6.5 STRL GRN (GLOVE) ×1 IMPLANT
GLOVE SS BIOGEL STRL SZ 6 (GLOVE) ×1 IMPLANT
GOWN STRL REUS W/ TWL LRG LVL3 (GOWN DISPOSABLE) ×1 IMPLANT
GOWN STRL REUS W/ TWL XL LVL3 (GOWN DISPOSABLE) IMPLANT
GOWN STRL REUS W/TWL LRG LVL3 (GOWN DISPOSABLE) ×1
GOWN STRL REUS W/TWL XL LVL3 (GOWN DISPOSABLE)
GRASPER SUT TROCAR 14GX15 (MISCELLANEOUS) IMPLANT
HEMOSTAT SNOW SURGICEL 2X4 (HEMOSTASIS) IMPLANT
IRRIG SUCT STRYKERFLOW 2 WTIP (MISCELLANEOUS) ×1
IRRIGATION SUCT STRKRFLW 2 WTP (MISCELLANEOUS) ×1 IMPLANT
KIT BASIN OR (CUSTOM PROCEDURE TRAY) ×1 IMPLANT
KIT TURNOVER KIT A (KITS) IMPLANT
NDL INSUFFLATION 14GA 120MM (NEEDLE) ×1 IMPLANT
NEEDLE INSUFFLATION 14GA 120MM (NEEDLE) ×2 IMPLANT
SCISSORS LAP 5X35 DISP (ENDOMECHANICALS) ×1 IMPLANT
SET CHOLANGIOGRAPH MIX (MISCELLANEOUS) IMPLANT
SET TUBE SMOKE EVAC HIGH FLOW (TUBING) ×1 IMPLANT
SLEEVE Z-THREAD 5X100MM (TROCAR) ×2 IMPLANT
SPIKE FLUID TRANSFER (MISCELLANEOUS) ×1 IMPLANT
STRIP CLOSURE SKIN 1/2X4 (GAUZE/BANDAGES/DRESSINGS) IMPLANT
SUT MNCRL AB 4-0 PS2 18 (SUTURE) ×1 IMPLANT
SYS BAG RETRIEVAL 10MM (BASKET) ×1
SYSTEM BAG RETRIEVAL 10MM (BASKET) ×1 IMPLANT
TOWEL OR 17X26 10 PK STRL BLUE (TOWEL DISPOSABLE) ×1 IMPLANT
TOWEL OR NON WOVEN STRL DISP B (DISPOSABLE) IMPLANT
TRAY LAPAROSCOPIC (CUSTOM PROCEDURE TRAY) ×1 IMPLANT
TROCAR ADV FIXATION 12X100MM (TROCAR) ×1 IMPLANT
TROCAR Z-THREAD OPTICAL 5X100M (TROCAR) ×1 IMPLANT

## 2022-09-17 NOTE — Transfer of Care (Signed)
Immediate Anesthesia Transfer of Care Note  Patient: Lindsay Wong  Procedure(s) Performed: LAPAROSCOPIC CHOLECYSTECTOMY  Patient Location: PACU  Anesthesia Type:General  Level of Consciousness: awake, alert , and oriented  Airway & Oxygen Therapy: Patient Spontanous Breathing and Patient connected to face mask oxygen  Post-op Assessment: Report given to RN and Post -op Vital signs reviewed and stable  Post vital signs: Reviewed and stable  Last Vitals:  Vitals Value Taken Time  BP    Temp    Pulse    Resp 13 09/17/22 1504  SpO2    Vitals shown include unvalidated device data.  Last Pain:  Vitals:   09/17/22 1100  TempSrc: Oral  PainSc:       Patients Stated Pain Goal: 3 (09/13/22 1032)  Complications: No notable events documented.

## 2022-09-17 NOTE — Anesthesia Preprocedure Evaluation (Addendum)
Anesthesia Evaluation  Patient identified by MRN, date of birth, ID band Patient awake    Reviewed: Allergy & Precautions, H&P , NPO status , Patient's Chart, lab work & pertinent test results, reviewed documented beta blocker date and time   History of Anesthesia Complications (+) PONV and history of anesthetic complications  Airway Mallampati: II  TM Distance: >3 FB Neck ROM: Full    Dental no notable dental hx. (+) Teeth Intact, Dental Advisory Given   Pulmonary neg pulmonary ROS   Pulmonary exam normal breath sounds clear to auscultation       Cardiovascular hypertension, Pt. on medications and Pt. on home beta blockers  Rhythm:Regular Rate:Normal     Neuro/Psych   Anxiety Depression    negative neurological ROS     GI/Hepatic Neg liver ROS,GERD  Medicated,,  Endo/Other  negative endocrine ROS    Renal/GU negative Renal ROS  negative genitourinary   Musculoskeletal  (+) Arthritis , Osteoarthritis,    Abdominal   Peds  Hematology  (+) Blood dyscrasia, anemia   Anesthesia Other Findings   Reproductive/Obstetrics negative OB ROS                             Anesthesia Physical Anesthesia Plan  ASA: 2  Anesthesia Plan: General   Post-op Pain Management: Tylenol PO (pre-op)*   Induction: Intravenous  PONV Risk Score and Plan: 4 or greater and Ondansetron, Dexamethasone, Midazolam and Scopolamine patch - Pre-op  Airway Management Planned: Oral ETT  Additional Equipment:   Intra-op Plan:   Post-operative Plan: Extubation in OR  Informed Consent: I have reviewed the patients History and Physical, chart, labs and discussed the procedure including the risks, benefits and alternatives for the proposed anesthesia with the patient or authorized representative who has indicated his/her understanding and acceptance.     Dental advisory given  Plan Discussed with:  CRNA  Anesthesia Plan Comments:        Anesthesia Quick Evaluation

## 2022-09-17 NOTE — Anesthesia Procedure Notes (Signed)
Procedure Name: Intubation Date/Time: 09/17/2022 1:50 PM  Performed by: Kizzie Fantasia, CRNAPre-anesthesia Checklist: Patient identified, Emergency Drugs available, Suction available, Patient being monitored and Timeout performed Patient Re-evaluated:Patient Re-evaluated prior to induction Oxygen Delivery Method: Circle system utilized Preoxygenation: Pre-oxygenation with 100% oxygen Induction Type: IV induction Ventilation: Mask ventilation without difficulty Laryngoscope Size: Mac and 3 Grade View: Grade I Tube type: Oral Tube size: 7.0 mm Number of attempts: 1 Airway Equipment and Method: Stylet Placement Confirmation: ETT inserted through vocal cords under direct vision, positive ETCO2 and breath sounds checked- equal and bilateral Secured at: 21 cm Tube secured with: Tape Dental Injury: Teeth and Oropharynx as per pre-operative assessment

## 2022-09-17 NOTE — Discharge Instructions (Signed)
LAPAROSCOPIC SURGERY: POST OP INSTRUCTIONS   EAT Gradually transition to a high fiber diet with a fiber supplement over the next few weeks after discharge.  Start with a pureed / full liquid diet (see below)  WALK Walk an hour a day (cumulative- not all at once).  Control your pain to do that.    CONTROL PAIN Control pain so that you can walk, sleep, tolerate sneezing/coughing, go up/down stairs.  HAVE A BOWEL MOVEMENT DAILY Keep your bowels regular to avoid problems.  OK to try a laxative to override constipation.  OK to use an antidiarrheal to slow down diarrhea.  Call if not better after 2 tries  CALL IF YOU HAVE PROBLEMS/CONCERNS Call if you are still struggling despite following these instructions. Call if you have concerns not answered by these instructions    DIET: Follow a light bland diet & liquids the first 24 hours after arrival home, such as soup, liquids, starches, etc.  Be sure to drink plenty of fluids.  Quickly advance to a usual solid diet within a few days.  Avoid fast food or heavy meals initially as you are more likely to get nauseated or have irregular bowels.  A low-sugar, high-fiber diet for the rest of your life is ideal.  Take your usually prescribed home medications unless otherwise directed.  PAIN CONTROL: Pain is best controlled by a usual combination of three different methods TOGETHER: Ice/Heat Over the counter pain medication Prescription pain medication Most patients will experience some swelling and bruising around the incisions.  Ice packs or heating pads (30-60 minutes up to 6 times a day) will help. Use ice for the first few days to help decrease swelling and bruising, then switch to heat to help relax tight/sore spots and speed recovery.  Some people prefer to use ice alone, heat alone, alternating between ice & heat.  Experiment to what works for you.  Swelling and bruising can take several weeks to resolve.   It is helpful to take an  over-the-counter pain medication regularly for the first few days: Naproxen (Aleve, etc)  Two 220mg tabs twice a day OR Ibuprofen (Advil, etc) Three 200mg tabs four times a day (every meal & bedtime) AND Acetaminophen (Tylenol, etc) 500-650mg four times a day (every meal & bedtime) A  prescription for pain medication (such as oxycodone, hydrocodone, tramadol, gabapentin, methocarbamol, etc) should be given to you upon discharge.  Take your pain medication as prescribed, IF NEEDED.  If you are having problems/concerns with the prescription medicine (does not control pain, nausea, vomiting, rash, itching, etc), please call us [phone] to see if we need to switch you to a different pain medicine that will work better for you and/or control your side effect better. If you need a refill on your pain medication, please give us 48 hour notice.  contact your pharmacy.  They will contact our office to request authorization. Prescriptions will not be filled after 5 pm or on week-ends  Avoid getting constipated.   Between the surgery and the pain medications, it is common to experience some constipation.   Increasing fluid intake and taking a fiber supplement (such as Metamucil, Citrucel, FiberCon, MiraLax, etc) 1-2 times a day regularly will usually help prevent this problem from occurring.   A mild laxative (prune juice, Milk of Magnesia, MiraLax, etc) should be taken according to package directions if there are no bowel movements after 48 hours.   Watch out for diarrhea.   If you have many   loose bowel movements, simplify your diet to bland foods & liquids for a few days.   Stop any stool softeners and decrease your fiber supplement.   Switching to mild anti-diarrheal medications (Kayopectate, Pepto Bismol) can help.   If this worsens or does not improve, please call us.  Wash / shower every day.  You may shower over the skin glue which is waterproof.  Do not soak or submerge incisions.  No rubbing,  scrubbing, lotions or ointments to incisions.  Glue will flake off after about 2 weeks.  You may leave the incision open to air.  You may replace a dressing/Band-Aid to cover the incision for comfort if you wish.   ACTIVITIES as tolerated:   You may resume regular (light) daily activities beginning the next day--such as daily self-care, walking, climbing stairs--gradually increasing activities as tolerated.  If you can walk 30 minutes without difficulty, it is safe to try more intense activity such as jogging, treadmill, bicycling, low-impact aerobics, swimming, etc. Save the most intensive and strenuous activity for last such as sit-ups, heavy lifting, contact sports, etc  Refrain from any heavy lifting or straining until you are off narcotics for pain control.   DO NOT PUSH THROUGH PAIN.  Let pain be your guide: If it hurts to do something, don't do it.  Pain is your body warning you to avoid that activity for another week until the pain goes down. You may drive when you are no longer taking prescription pain medication, you can comfortably wear a seatbelt, and you can safely maneuver your car and apply brakes. You may have sexual intercourse when it is comfortable.  FOLLOW UP in our office Please call [facility] at [phone] to set up an appointment to see your surgeon in the office for a follow-up appointment approximately 2-3 weeks after your surgery. Make sure that you call for this appointment the day you arrive home to insure a convenient appointment time.  10. IF YOU HAVE DISABILITY OR FAMILY LEAVE FORMS, BRING THEM TO THE OFFICE FOR PROCESSING.  DO NOT GIVE THEM TO YOUR DOCTOR.   WHEN TO CALL US [phone]: Poor pain control Reactions / problems with new medications (rash/itching, nausea, etc)  Fever over 101.5 F (38.5 C) Inability to urinate Nausea and/or vomiting Worsening swelling or bruising Continued bleeding from incision. Increased pain, redness, or drainage from the  incision   The clinic staff is available to answer your questions during regular business hours (8:30am-5pm).  Please don't hesitate to call and ask to speak to one of our nurses for clinical concerns.   If you have a medical emergency, go to the nearest emergency room or call 911.  A surgeon from [facility] is always on call at the hospitals   [facility] [address], [address], [place], [place]  [address] ? MAIN: [phone] ? TOLL FREE: [phone] ?  FAX [phone] [email or web address]  

## 2022-09-17 NOTE — Interval H&P Note (Signed)
History and Physical Interval Note:  09/17/2022 1:03 PM  Lindsay Wong  has presented today for surgery, with the diagnosis of BILIARY COLIC.  The various methods of treatment have been discussed with the patient and family. After consideration of risks, benefits and other options for treatment, the patient has consented to  Procedure(s): LAPAROSCOPIC CHOLECYSTECTOMY WITH POSSIBLE INTRAOPERATIVE CHOLANGIOGRAM (N/A) as a surgical intervention.  The patient's history has been reviewed, patient examined, no change in status, stable for surgery.  I have reviewed the patient's chart and labs.  Questions were answered to the patient's satisfaction.     [name] Lollie Sails

## 2022-09-17 NOTE — Anesthesia Postprocedure Evaluation (Signed)
Anesthesia Post Note  Patient: Lindsay Wong  Procedure(s) Performed: LAPAROSCOPIC CHOLECYSTECTOMY     Patient location during evaluation: PACU Anesthesia Type: General Level of consciousness: awake and alert, oriented and patient cooperative Pain management: pain level controlled Vital Signs Assessment: post-procedure vital signs reviewed and stable Respiratory status: spontaneous breathing, nonlabored ventilation and respiratory function stable Cardiovascular status: blood pressure returned to baseline and stable Postop Assessment: no apparent nausea or vomiting Anesthetic complications: no   No notable events documented.  Last Vitals:  Vitals:   09/17/22 1513 09/17/22 1515  BP:  136/86  Pulse: 97 94  Resp: (!) 22 (!) 25  Temp:    SpO2: 100% 100%    Last Pain:  Vitals:   09/17/22 1515  TempSrc:   PainSc: 0-No pain                 Lannie Fields

## 2022-09-17 NOTE — Op Note (Signed)
Operative Note  Lindsay Wong 42 y.o. female [id number]  09/17/2022  Surgeon: Berna Bue MD FACS  Procedure performed: Laparoscopic Cholecystectomy  Preop diagnosis: Biliary colic Post-op diagnosis/intraop findings: same; chronic cholecystitis  Specimens: gallbladder  Retained items: none  EBL: minimal  Complications: none  Description of procedure: After confirming informed consent the patient was brought to the operating room. Antibiotics were administered. SCD's were applied. General endotracheal anesthesia was initiated and a formal time-out was performed. The abdomen was prepped and draped in the usual sterile fashion and the abdomen was entered using  a left subcostal Veress needle  after instilling the site with local. Insufflation to was obtained, 5mm trocar and camera inserted using optical entry in the right upper quadrant, and gross inspection revealed no evidence of injury from our entry or other intraabdominal abnormalities.  The patient was placed in reverse Trendelenburg and rotated to the left.  Two 5mm trocars were introduced in the infraumbilical and right anterior axillary lines under direct visualization and following infiltration with local. A 12mm trocar was placed in the epigastrium. The gallbladder was fundus retracted cephalad and omental adhesions to the gallbladder were taken down bluntly and with cautery until the infundibulum was visualized; the infundibulum was retracted laterally. A combination of hook electrocautery and blunt dissection was utilized to clear the peritoneum from the neck and cystic duct, circumferentially isolating the cystic artery and cystic duct and lifting the gallbladder from the cystic plate.  There was a small vessel coursing along the cystic duct which was clipped proximally and divided distally with cautery.  The critical view of safety was achieved with the cystic artery, cystic duct, and liver bed visualized between them with  no other structures. The artery was clipped with a single clip proximally and distally and divided as was the cystic duct with three clips on the proximal end. The gallbladder was dissected from the liver plate using electrocautery.  As we progressed with this dissection, there was a very small, perhaps 1 mm tubular structure entering the gallbladder along the mid body.  This was clipped and divided sharply and appeared to be a duct of Luschka.  Once freed the gallbladder was placed in an endocatch bag and removed through the epigastric trocar site.  The liver bed was hemostatic. Some bile had been spilled from the gallbladder (small tear in the midportion of the gallbladder) during its dissection from the liver bed. This was aspirated and the right upper quadrant was irrigated copiously until the effluent was clear. Hemostasis was once again confirmed, and reinspection of the abdomen revealed no injuries. The clips were well opposed without any bile leak from the duct or the liver bed.  We waited several minutes and reinspected the liver bed to confirm no bile leak given the aberrant duct of Luschka that had been seen.  There was none appreciable. The 12mm trocar site in the epigastrium was closed with a 0 vicryl in the fascia under direct visualization using a PMI device. The abdomen was desufflated and all trocars removed. The skin incisions were closed with subcuticular 4-0 monocryl and Dermabond. The patient was awakened, extubated and transported to the recovery room in stable condition.    All counts were correct at the completion of the case.

## 2022-09-18 ENCOUNTER — Telehealth (INDEPENDENT_AMBULATORY_CARE_PROVIDER_SITE_OTHER): Payer: No Payment, Other | Admitting: Psychiatry

## 2022-09-18 ENCOUNTER — Encounter (HOSPITAL_COMMUNITY): Payer: Self-pay | Admitting: Surgery

## 2022-09-18 DIAGNOSIS — F411 Generalized anxiety disorder: Secondary | ICD-10-CM

## 2022-09-18 DIAGNOSIS — F4522 Body dysmorphic disorder: Secondary | ICD-10-CM

## 2022-09-18 DIAGNOSIS — F410 Panic disorder [episodic paroxysmal anxiety] without agoraphobia: Secondary | ICD-10-CM | POA: Diagnosis not present

## 2022-09-18 DIAGNOSIS — F339 Major depressive disorder, recurrent, unspecified: Secondary | ICD-10-CM

## 2022-09-18 NOTE — Patient Instructions (Addendum)
Thank you for attending your appointment today.  -- START melatonin 1-3 mg nightly - 1 hour before bedtime -- INCREASE propranolol to 30 mg before social situations as needed -- Continue other medications as prescribed.  Please do not make any changes to medications without first discussing with your provider. If you are experiencing a psychiatric emergency, please call 911 or present to your nearest emergency department. Additional crisis, medication management, and therapy resources are included below.  [facility]  [address], [place], [place] [address] [phone] WALK-IN URGENT CARE 24/7 FOR ANYONE [address] [place], [place]  [phone] Fax: [phone] [email or web address] *Interpreters available *Accepts all insurance and uninsured for Urgent Care needs *Accepts Medicaid and uninsured for outpatient treatment (below)      ONLY FOR [facility]  Below:    Outpatient New Patient Assessment/Therapy Walk-ins:        Monday -Thursday 8am until slots are full.        Every Friday 1pm-4pm  (first come, first served)                   New Patient Psychiatry/Medication Management        Monday-Friday 8am-11am (first come, first served)               For all walk-ins we ask that you arrive by 7:15am, because patients will be seen in the order of arrival.

## 2022-09-19 LAB — SURGICAL PATHOLOGY

## 2022-09-28 ENCOUNTER — Other Ambulatory Visit (HOSPITAL_COMMUNITY): Payer: Self-pay | Admitting: Psychiatry
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
## 2022-10-16 ENCOUNTER — Telehealth (HOSPITAL_COMMUNITY): Payer: No Payment, Other | Admitting: Psychiatry

## 2022-10-16 ENCOUNTER — Encounter (HOSPITAL_COMMUNITY): Payer: Self-pay
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
## 2022-10-28 ENCOUNTER — Telehealth (INDEPENDENT_AMBULATORY_CARE_PROVIDER_SITE_OTHER): Payer: No Payment, Other | Admitting: Psychiatry

## 2022-10-28 ENCOUNTER — Encounter (HOSPITAL_COMMUNITY): Payer: Self-pay | Admitting: Psychiatry

## 2022-10-28 DIAGNOSIS — F410 Panic disorder [episodic paroxysmal anxiety] without agoraphobia: Secondary | ICD-10-CM | POA: Diagnosis not present

## 2022-10-28 DIAGNOSIS — F4522 Body dysmorphic disorder: Secondary | ICD-10-CM

## 2022-10-28 DIAGNOSIS — F411 Generalized anxiety disorder: Secondary | ICD-10-CM

## 2022-10-28 DIAGNOSIS — F339 Major depressive disorder, recurrent, unspecified: Secondary | ICD-10-CM | POA: Diagnosis not present

## 2022-10-28 MED ORDER — SERTRALINE HCL 100 MG PO TABS: 100.0000 mg | ORAL_TABLET | Freq: Every day | ORAL | 2 refills | Status: DC

## 2022-10-28 NOTE — Patient Instructions (Signed)
Thank you for attending your appointment today.  -- INCREASE Zoloft to 100 mg daily -- Continue other medications as prescribed.  Please do not make any changes to medications without first discussing with your provider. If you are experiencing a psychiatric emergency, please call 911 or present to your nearest emergency department. Additional crisis, medication management, and therapy resources are included below.  [facility]  [address], [place], [place] [address] [phone] WALK-IN URGENT CARE 24/7 FOR ANYONE [address], [place], [place]  [phone] Fax: [phone] [email or web address] *Interpreters available *Accepts all insurance and uninsured for Urgent Care needs *Accepts Medicaid and uninsured for outpatient treatment (below)      ONLY FOR [place] Residents  Below:    Outpatient New Patient Assessment/Therapy Walk-ins:        Monday -Thursday 8am until slots are full.        Every Friday 1pm-4pm  (first come, first served)                   New Patient Psychiatry/Medication Management        Monday-Friday 8am-11am (first come, first served)               For all walk-ins we ask that you arrive by 7:15am, because patients will be seen in the order of arrival.   

## 2022-11-02 ENCOUNTER — Other Ambulatory Visit (HOSPITAL_COMMUNITY): Payer: Self-pay | Admitting: Psychiatry

## 2022-11-12 ENCOUNTER — Ambulatory Visit (HOSPITAL_COMMUNITY): Payer: Self-pay | Admitting: Licensed Clinical Social Worker

## 2022-11-12 DIAGNOSIS — F331 Major depressive disorder, recurrent, moderate: Secondary | ICD-10-CM

## 2022-11-12 DIAGNOSIS — F410 Panic disorder [episodic paroxysmal anxiety] without agoraphobia: Secondary | ICD-10-CM
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
## 2022-12-26 ENCOUNTER — Telehealth (HOSPITAL_COMMUNITY): Payer: Self-pay | Admitting: Licensed Clinical Social Worker

## 2022-12-26 ENCOUNTER — Ambulatory Visit (HOSPITAL_COMMUNITY): Payer: Self-pay | Admitting: Licensed Clinical Social Worker

## 2022-12-26 ENCOUNTER — Encounter (HOSPITAL_COMMUNITY): Payer: Self-pay

## 2022-12-26 NOTE — Telephone Encounter (Signed)
LCSW spoke with pt for virtual appointment today 8/15 as she had an appointment at 0800 but pt reported not feeling well and wanted to rescheduled Pt has new appt for 8/29 and 9/18 both at 8am. Pt to be marked as left w/o being seen for today
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
## 2023-01-01 ENCOUNTER — Encounter (HOSPITAL_COMMUNITY): Payer: Self-pay | Admitting: Psychiatry

## 2023-01-01 ENCOUNTER — Telehealth (INDEPENDENT_AMBULATORY_CARE_PROVIDER_SITE_OTHER): Payer: No Payment, Other | Admitting: Psychiatry

## 2023-01-01 DIAGNOSIS — F339 Major depressive disorder, recurrent, unspecified: Secondary | ICD-10-CM

## 2023-01-01 DIAGNOSIS — F4522 Body dysmorphic disorder: Secondary | ICD-10-CM

## 2023-01-01 DIAGNOSIS — F410 Panic disorder [episodic paroxysmal anxiety] without agoraphobia: Secondary | ICD-10-CM | POA: Diagnosis not present

## 2023-01-01 DIAGNOSIS — F411 Generalized anxiety disorder: Secondary | ICD-10-CM | POA: Diagnosis not present

## 2023-01-01 DIAGNOSIS — G479 Sleep disorder, unspecified: Secondary | ICD-10-CM

## 2023-01-01 MED ORDER — SERTRALINE HCL 100 MG PO TABS: 100.0000 mg | ORAL_TABLET | Freq: Every day | ORAL | 3 refills | Status: DC

## 2023-01-01 NOTE — Patient Instructions (Signed)

## 2023-01-06 ENCOUNTER — Encounter (HOSPITAL_COMMUNITY): Payer: Self-pay

## 2023-01-06 ENCOUNTER — Telehealth (HOSPITAL_COMMUNITY): Payer: Self-pay | Admitting: Licensed Clinical Social Worker

## 2023-01-06 NOTE — Telephone Encounter (Signed)
Called and LVM asking patient to call our office back by noon Wednesday to Confirm New Patient appt otherwise appointment will be cancelled. Also sending My chart Message

## 2023-01-09 ENCOUNTER — Ambulatory Visit (INDEPENDENT_AMBULATORY_CARE_PROVIDER_SITE_OTHER): Payer: No Payment, Other | Admitting: Licensed Clinical Social Worker

## 2023-01-09 DIAGNOSIS — F321 Major depressive disorder, single episode, moderate: Secondary | ICD-10-CM | POA: Diagnosis not present

## 2023-01-09 DIAGNOSIS — F411 Generalized anxiety disorder: Secondary | ICD-10-CM

## 2023-01-09 DIAGNOSIS — F410 Panic disorder [episodic paroxysmal anxiety] without agoraphobia: Secondary | ICD-10-CM

## 2023-01-09 NOTE — Progress Notes (Signed)
THERAPIST PROGRESS NOTE  Virtual Visit via Video Note  I connected with Lindsay Wong on 01/09/23 at  8:00 AM EDT by a video enabled telemedicine application and verified that I am speaking with the correct person using two identifiers.  Location: Patient: [facility] Provider: Providers Home    I discussed the limitations of evaluation and management by telemedicine and the availability of in person appointments. The patient expressed understanding and agreed to proceed.    I discussed the assessment and treatment plan with the patient. The patient was provided an opportunity to ask questions and all were answered. The patient agreed with the plan and demonstrated an understanding of the instructions.   The patient was advised to call back or seek an in-person evaluation if the symptoms worsen or if the condition fails to improve as anticipated.  I provided 30 minutes of non-face-to-face time during this encounter.   Lindsay Cooks, LCSW   Participation Level: Active  Behavioral Response: CasualAlertAnxious and Depressed  Type of Therapy: Individual Therapy  Treatment Goals addressed:  Active     Anxiety     LTG: Lindsay Wong will score less than 5 on the Generalized Anxiety Disorder 7 Scale (GAD-7)      Start:  11/12/22    Expected End:  04/11/23         STG: Lindsay Wong will participate in at least 80% of scheduled individual psychotherapy sessions  (Progressing)     Start:  11/12/22    Expected End:  04/11/23         STG: Lindsay Wong will complete at least 80% of assigned homework  (Progressing)     Start:  11/12/22    Expected End:  04/11/23         STG: Lindsay Wong will practice problem solving skills 3 times per week for the next 4 weeks.  (Progressing)     Start:  11/12/22    Expected End:  04/11/23         Identify 3 triggers for anxiety (Progressing)     Start:  11/12/22    Expected End:  04/11/23       Goal Note     Social anxiety          Identify 3 coping  skills for anxiety     Start:  11/12/22    Expected End:  04/11/23       Goal Note     Grounding technique          Encourage Lindsay Wong to take psychotropic medication(s) as prescribed     Start:  11/12/22         Review results of GAD-7 with Lindsay Wong to track progress     Start:  11/12/22         Work with Lindsay Wong to track symptoms, triggers, and/or skill use through a mood chart, diary card, or journal     Start:  11/12/22         Perform psychoeducation regarding anxiety disorders     Start:  11/12/22         Provide Lindsay Wong with educational information and reading material on anxiety, its causes, and symptoms.      Start:  11/12/22         Work with patient individually to identify the major components of a recent episode of anxiety: physical symptoms, major thoughts and images, and major behaviors they experienced     Start:  11/12/22  Work with Lindsay Wong to identify 3 personal goals for managing their anxiety to work on during current treatment.      Start:  11/12/22         Work with Lindsay Wong to identify a minimum of 3 consequences of avoidance.      Start:  11/12/22         Work with Lindsay Wong to identify a minimum of 3 alternative coping behaviors to avoidance.      Start:  11/12/22         Discuss risks and benefits of medication treatment options for this problem and prescribe as indicated (Completed)     Start:  11/12/22    End:  01/09/23    Intervention Note     Reviewed with patient during session.            OP Depression     LTG: Lindsay Wong will score less than 10 on the Patient Health Questionnaire (PHQ-9)      Start:  11/12/22    Expected End:  04/11/23         Therapist will administer the PHQ-9 at weekly intervals for the next 12 weeks     Start:  11/12/22              ProgressTowards Goals: Progressing  Interventions: CBT and Motivational Interviewing   Suicidal/Homicidal: Nowithout intent/plan  Therapist Response:      Pt was alert  and oriented x 5. She was dressed casually and engaged well in therapy session. Lindsay Wong was pleasant, cooperative, and maintained good eye contact. She presented with anxious mood/affect.  She reports stressor for social anxiety. Lindsay Wong endorses symptoms for fatigue, tension and worry when in crowds of more than 15 people. She used examples of church and school events for her kids. Pt reports that she does better in work environments. 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  Pt reports when she stays busy and occupies her mind anxiety decreases.  Interventions/Plan: LCSW went over grounding techniques for 5,4,3,2,1 grounding techniques, progressive muscle relaxation, and categories. These are all to use in social anxiety settings. LCSW asked pt to read worksheet provided in email and use at least 1 grounding technique prior to next session. Pt agreeable. LCSW used supportive therapy for praise and encouragement. LCSW used psychoanalytic therapy for free association letting pt express thoughts, feelings, and concerns in session.     Plan: Return again in 3 weeks.  Diagnosis: Generalized anxiety disorder with panic attacks  Current moderate episode of major depressive disorder without prior episode (HCC)  Collaboration of Care: Other None today   Patient/Guardian was advised Release of Information must be obtained prior to any record release in order to collaborate their care with an outside provider. Patient/Guardian was advised if they have not already done so to contact the registration department to sign all necessary forms in order for [place] to release information regarding their care.   Consent: Patient/Guardian gives verbal consent for treatment and assignment of benefits for services provided during this visit. Patient/Guardian expressed understanding and agreed to proceed.   Lindsay Cooks, LCSW 01/09/2023

## 2023-01-30 ENCOUNTER — Ambulatory Visit (HOSPITAL_COMMUNITY): Payer: No Payment, Other | Admitting: Licensed Clinical Social Worker

## 2023-02-13 ENCOUNTER — Ambulatory Visit (HOSPITAL_COMMUNITY): Payer: No Payment, Other | Admitting: Licensed Clinical Social Worker

## 2023-03-06 ENCOUNTER — Ambulatory Visit (HOSPITAL_COMMUNITY): Payer: No Payment, Other | Admitting: Licensed Clinical Social Worker

## 2023-03-06 ENCOUNTER — Encounter (HOSPITAL_COMMUNITY): Payer: Self-pay
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
## 2023-03-10 ENCOUNTER — Encounter (HOSPITAL_COMMUNITY): Payer: Self-pay | Admitting: Psychiatry

## 2023-03-10 ENCOUNTER — Telehealth (INDEPENDENT_AMBULATORY_CARE_PROVIDER_SITE_OTHER): Payer: No Payment, Other | Admitting: Psychiatry

## 2023-03-10 DIAGNOSIS — F4522 Body dysmorphic disorder: Secondary | ICD-10-CM | POA: Diagnosis not present

## 2023-03-10 DIAGNOSIS — F411 Generalized anxiety disorder: Secondary | ICD-10-CM

## 2023-03-10 DIAGNOSIS — F3342 Major depressive disorder, recurrent, in full remission: Secondary | ICD-10-CM | POA: Diagnosis not present

## 2023-03-10 DIAGNOSIS — F410 Panic disorder [episodic paroxysmal anxiety] without agoraphobia: Secondary | ICD-10-CM

## 2023-03-10 MED ORDER — SERTRALINE HCL 100 MG PO TABS: 100.0000 mg | ORAL_TABLET | Freq: Every day | ORAL | 1 refills | Status: DC

## 2023-03-10 NOTE — Patient Instructions (Signed)

## 2023-03-21 ENCOUNTER — Ambulatory Visit (HOSPITAL_COMMUNITY): Payer: No Payment, Other | Admitting: Licensed Clinical Social Worker

## 2023-03-21 ENCOUNTER — Encounter (HOSPITAL_COMMUNITY): Payer: Self-pay

## 2023-05-12 NOTE — Progress Notes (Signed)
 Office Visit Note  Patient: Lindsay Wong             Date of Birth: May 11, 1981           MRN: [id number]             PCP: Vernon Velna JONELLE, MD Referring: Vernon Velna JONELLE, MD Visit Date: 05/26/2023 Occupation: @GUAROCC @  Subjective:  Medication monitoring   History of Present Illness: Lindsay Wong is a 42 y.o. female with history of autoimmune disease.  Patient was last seen in the office on 01/30/2022.  Patient reports that she had to establish care with rheumatology at [facility] due to a change in insurance but has now transferred back to be under our care going forward.  Patient reports that she has remained on Plaquenil  200 mg 1 tablet by mouth twice daily Monday through Friday without interruption.  She did request a refill of Plaquenil  to be sent to the pharmacy today.  Patient reports that since she was last seen she has had her gallbladder removed but has had issues with recurrent elevations of ALT.  Patient will be scheduling an abdominal CT for further evaluation and will also be reestablishing care with GI. States that she is also scheduled for an upcoming Plaquenil  eye examination.  Patient states that she has not yet seen dermatology but states that she continues to have a recurrent rash on her forearms concerning for possible psoriasis.  She has been applying betamethasone  cream topically as needed and requested a refill to be sent to the pharmacy today until she is able to establish care with dermatology. She states that she has had intermittent symptoms of Raynaud's phenomenon especially with the cooler weather temperatures.  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 Patient reports that her joint pain and stiffness have been well-controlled with the use of Plaquenil .    Activities of Daily Living:  Patient reports morning stiffness for  0 minute.   Patient Denies nocturnal pain.  Difficulty dressing/grooming: Denies Difficulty climbing stairs: Denies Difficulty getting out of chair: Denies Difficulty using hands for taps, buttons, cutlery, and/or writing: Denies  Review of Systems  Constitutional:  Positive for fatigue.  HENT:  Positive for mouth dryness. Negative for mouth sores.   Eyes:  Positive for dryness.  Respiratory:  Negative for shortness of breath.   Cardiovascular:  Positive for chest pain and palpitations.  Gastrointestinal:  Negative for blood in stool, constipation and diarrhea.  Endocrine: Negative for increased urination.  Genitourinary:  Negative for involuntary urination.  Musculoskeletal:  Positive for muscle tenderness. Negative for joint pain, gait problem, joint pain, joint swelling, myalgias, muscle weakness, morning stiffness and myalgias.  Skin:  Positive for color change, hair loss and sensitivity to sunlight. Negative for rash.  Allergic/Immunologic: Negative for susceptible to infections.  Neurological:  Positive for headaches. Negative for dizziness.  Hematological:  Negative for swollen glands.  Psychiatric/Behavioral:  Positive for sleep disturbance. Negative for depressed mood. The patient is nervous/anxious.     PMFS History:  Patient Active Problem List   Diagnosis Date Noted   Body dysmorphic disorder 09/02/2022   Generalized anxiety disorder with panic attacks 09/02/2022   MDD (major depressive disorder), recurrent, in full remission (HCC) 09/02/2022   Insulin resistance 09/24/2021   Class 1 obesity with serious comorbidity and body mass index (BMI) of 30.0 to  30.9 in adult 09/24/2021   Benign essential hypertension 07/13/2021   Foraminal stenosis of cervical region 07/04/2020   Adverse effect of other viral vaccines, subsequent encounter 01/27/2020   Spinal stenosis of cervical region 12/28/2019   Fatigue 08/20/2019   Left shoulder pain 07/19/2014   RAYNAUD'S DISEASE  01/31/2007    Past Medical History:  Diagnosis Date   Allergy    seasonal   Anxiety and depression    per patient diagnosed by psychiatrist   Appendicitis 07/20/2012   Arthritis    Bilateral swelling of feet    resolved   Blood in stool    Chest pain    Chicken pox    Chronic fatigue syndrome    Connective tissue disease (HCC)    Constipation    Depression    postpartum with one child   Essential hypertension 07/13/2021   history no longer taking medication   Fatty liver    GERD 01/31/2007   Qualifier: Diagnosis of  By: Wilhemina RMA, [name]     Hx of degenerative disc disease    Hyperlipidemia    postpartum   IBS (irritable bowel syndrome)    Iron deficiency anemia, unspecified    Osteoarthritis    Palpitations    PONV (postoperative nausea and vomiting)    Positive ANA (antinuclear antibody)    RAYNAUD'S DISEASE 01/31/2007   Qualifier: Diagnosis of  By: Wilhemina RMA, [name]     Seasonal allergies    Urticaria    Vitamin D  deficiency     Family History  Problem Relation Age of Onset   Hyperlipidemia Mother    Early menopause Mother    Depression Mother    Anxiety disorder Mother    Other Father        no contact since infant   Alcoholism Father    Drug abuse Father    Raynaud syndrome Sister    Anxiety disorder Sister    Depression Sister    Immunodeficiency Maternal Aunt    Heart attack Maternal Aunt 43   CAD Maternal Aunt    Sleep apnea Maternal Aunt    Lupus Maternal Uncle    Leukemia Maternal Uncle    Cancer Maternal Grandfather        bladder   Bladder Cancer Maternal Grandfather    Cancer Maternal Grandmother        melanoma   Stroke Maternal Grandmother 77   Arthritis Maternal Grandmother    Hyperlipidemia Maternal Grandmother    Hypertension Maternal Grandmother    Breast cancer Maternal Grandmother 77   Multiple sclerosis Cousin    Autism Son    Healthy Son    Healthy Son    Healthy Son    Sleep apnea Niece    Lung cancer Other        all but  one of grandmother's siblings    Colon cancer Neg Hx    Esophageal cancer Neg Hx    Stomach cancer Neg Hx    Rectal cancer Neg Hx    Past Surgical History:  Procedure Laterality Date   ABDOMINOPLASTY  2015   mini   ANTERIOR FUSION CERVICAL SPINE  07/2020   CESAREAN SECTION     x3   CHOLECYSTECTOMY N/A 09/17/2022   Procedure: LAPAROSCOPIC CHOLECYSTECTOMY;  Surgeon: Signe Mitzie LABOR, MD;  Location: [facility];  Service: General;  Laterality: N/A;   COLONOSCOPY  01/2020   LAPAROSCOPIC APPENDECTOMY Right 07/20/2012   Procedure: APPENDECTOMY LAPAROSCOPIC;  Surgeon: Krystal CHRISTELLA Spinner, MD;  Location: [facility];  Service: General;  Laterality: Right;   WISDOM TOOTH EXTRACTION     Social History   Social History Narrative   Work or School: homemaker, 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,7 and 3 (2014)      Home Situation: lives with husband and children      Spiritual Beliefs: none      Lifestyle: hot yoga (used to), weights and cardio, tries to eat a healthy diet      Lives in a 2 story home      Right handed      Caffeine: 24-36 oz pepsi zero per day    Immunization History  Administered Date(s) Administered   Influenza Split 01/04/2011   Influenza Whole 04/07/2006, 02/01/2008   Influenza,inj,Quad PF,6+ Mos 01/14/2014, 02/08/2015, 02/20/2016, 02/12/2017, 02/13/2018, 03/01/2019, 03/05/2021   Influenza-Unspecified 01/11/2013, 02/11/2016, 03/13/2020   Janssen (J&J) SARS-COV-2 Vaccination 01/28/2020, 04/29/2020   Tdap 03/01/2019     Objective: Vital Signs: BP (!) 143/93 (BP Location: Left Arm, Patient Position: Sitting, Cuff Size: Normal)   Pulse 72   Resp 14   Ht 5' 3 (1.6 m)   Wt 134 lb (60.8 kg)   LMP  (LMP Unknown)   BMI 23.74 kg/m    Physical Exam Vitals and nursing note reviewed.  Constitutional:      Appearance: She is well-developed.  HENT:     Head: Normocephalic and atraumatic.  Eyes:     Conjunctiva/sclera: Conjunctivae normal.  Cardiovascular:     Rate and Rhythm: Normal rate and  regular rhythm.     Heart sounds: Normal heart sounds.  Pulmonary:     Effort: Pulmonary effort is normal.     Breath sounds: Normal breath sounds.  Abdominal:     General: Bowel sounds are normal.     Palpations: Abdomen is soft.  Musculoskeletal:     Cervical back: Normal range of motion.  Lymphadenopathy:     Cervical: No cervical adenopathy.  Skin:    General: Skin is warm and dry.     Capillary Refill: Capillary refill takes less than 2 seconds.  Neurological:     Mental Status: She is alert and oriented to person, place, and time.  Psychiatric:        Behavior: Behavior normal.      Musculoskeletal Exam: C-spine, thoracic spine, lumbar spine good range of motion.  Shoulder joints have good range of motion with no discomfort.  Elbow joints, wrist joints, MCPs, PIPs, DIPs have good range of motion with no synovitis.  Complete fist formation bilaterally.  Hip joints have good range of motion with no groin pain.  Knee joints have good range of motion no warmth or effusion.  Ankle joints have good range of motion with no tenderness or joint swelling.  CDAI Exam: CDAI Score: -- Patient Global: --; Provider Global: -- Swollen: --; Tender: -- Joint Exam 05/26/2023   No joint exam has been documented for this visit   There is currently no information documented on the homunculus. Go to the Rheumatology activity and complete the homunculus joint exam.  Investigation: No additional findings.  Imaging: No results found.  Recent Labs: Lab Results  Component Value Date   WBC 9.9 09/17/2022   HGB 13.4 09/17/2022   PLT 291 09/17/2022   NA 139 01/30/2022   K 4.1 01/30/2022   CL 104 01/30/2022   CO2 24 01/30/2022   GLUCOSE 75 01/30/2022   BUN 14 01/30/2022   CREATININE 0.85 01/30/2022   BILITOT 0.7  01/30/2022   ALKPHOS 76 06/30/2020   AST 25 01/30/2022   ALT 36 (H) 01/30/2022   PROT 8.2 (H) 01/30/2022   ALBUMIN 4.3 06/30/2020   CALCIUM  10.1 01/30/2022   GFRAA 119  06/19/2020    Speciality Comments: PLQ Eye Exam: 03/27/2022 WNL @ [facility] Follow up in 1 year  Procedures:  No procedures performed Allergies: Clarithromycin, Polyethylene glycol 2000 dimyristoyl glycerol, and Latex   Assessment / Plan:     Visit Diagnoses: Autoimmune disease (HCC) - ANA 1: 640 speckled pattern, ENA negative, C3-C4 normal, anticardiolipin, beta-2, LA-,  History of fatigue,oral ulcers, arthralgias, photosensitivity, sicca: She has not had any signs or symptoms of an autoimmune disease flare.  She has clinically been doing well taking Plaquenil  200 mg 1 tablet by mouth twice daily Monday through Friday.  Patient was last seen in the office on 01/30/2022.  According to the patient she had to transfer care to [facility] rheumatology due to a change in insurance but would like to reestablish care with us  since her insurance has now changed again.  Patient feels that Plaquenil  has been controlling her inflammatory arthritis.  She has no synovitis on examination today.  Patient continues to experience intermittent symptoms of Raynaud's phenomenon as well as intermittent rashes.  Referral to dermatology was placed today.  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  The following lab work will be obtained today for further evaluation.  A refill of Plaquenil  was sent to the pharmacy as well as refill of betamethasone  ointment as requested.  Patient is scheduled for an upcoming Plaquenil  eye examination and was given an eye examination form to take with her to her upcoming appointment.  We will call her with lab results.  She will follow up in 5 months or sooner if needed.    - Plan: Protein / creatinine ratio, urine, CBC with Differential/Platelet, COMPLETE METABOLIC PANEL WITH GFR, ANA, Anti-DNA antibody, double-stranded, C3 and C4, Sedimentation rate, Ambulatory referral to Dermatology  High  risk medication use - Plaquenil  200 mg 1 tablet by mouth twice daily M-F.  A refill of Plaquenil  sent to the pharmacy today. PLQ Eye Exam: 03/27/2022 WNL @ [facility] Follow up in 1 year.  Patient is scheduled for an upcoming Plaquenil  eye examination.  She was given eye examination form to take with her to her appointment. CBC and CMP were updated today.  She will continue to require updated lab work every 5 months. - Plan: CBC with Differential/Platelet, COMPLETE METABOLIC PANEL WITH GFR  Raynaud's syndrome without gangrene: She continues to experience intermittent symptoms of Raynaud's phenomenon especially with the cooler weather temperatures.  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  Primary osteoarthritis of both knees - Moderate OA-No warmth or effusion noted.   DDD (degenerative disc disease), cervical - S/P fusion 03/22 by Dr.Cram. Doing well.  No symptoms of radiculopathy.   Other fatigue:Chronic, Stable.  She has noted some increased brain fog recently but has not been sleeping well since her dog recently passed away.  Discussed the importance of good sleep hygiene.  Her psychiatrist recently prescribed hydroxyzine  for insomnia which she plans on initiating.  Hypermobility of joint: Overall her arthralgias have been well-controlled.  Benign essential hypertension: Blood pressure was 143/93 today in the office.  Patient was advised  to monitor blood pressure closely and to reach out to PCP if her blood pressure remains elevated.  Rash and other nonspecific skin eruption -Patient has experienced a recurrent rash on her forearms concerning her for psoriasis.  She has 1 scaly patch noticed on the dorsal aspect of her right arm and a few lesions that have been scabbed over from scratching the lesion.  Patient has requested a referral to dermatology to establish care.  She  also requested a refill of betamethasone  ointment which has been helpful in the past.  plan: Ambulatory referral to Dermatology  Hx of cholecystectomy: Performed on 09/17/2022.  Fatty liver: History of elevated LFTs.  Updated Right upper quadrant ultrasound concerning for fatty liver and nephrocalcinosis.  Actin smooth muscle antibody was elevated at 35 and mitochondrial M2 antibody was elevated at 24.2 on 09/25/2022.   AST and ALT were within normal limits on 09/25/2022. CMP updated today.  Patient has been referred to a hepatologist for further evaluation.  Other medical conditions are listed as follows:   History of esophageal reflux  Pre-eclampsia in third trimester  Anxiety and depression  History of migraine  Family history of multiple sclerosis   Orders: Orders Placed This Encounter  Procedures   Protein / creatinine ratio, urine   CBC with Differential/Platelet   COMPLETE METABOLIC PANEL WITH GFR   ANA   Anti-DNA antibody, double-stranded   C3 and C4   Sedimentation rate   Ambulatory referral to Dermatology   Meds ordered this encounter  Medications   hydroxychloroquine  (PLAQUENIL ) 200 MG tablet    Sig: Take 1 tablet 200 mg BID Monday-Friday    Dispense:  120 tablet    Refill:  0   betamethasone  dipropionate (DIPROLENE ) 0.05 % ointment    Sig: Apply topically 2 (two) times daily for 14 days.    Dispense:  45 g    Refill:  0    Suggested Packaging: 45.0 Grams tube.   Follow-Up Instructions: Return in about 5 months (around 10/24/2023) for Autoimmune Disease.   Waddell CHRISTELLA Craze, PA-C  Note - This record has been created using Dragon software.  Chart creation errors have been sought, but may not always  have been located. Such creation errors do not reflect on  the standard of medical care.

## 2023-05-26 ENCOUNTER — Ambulatory Visit: Payer: No Typology Code available for payment source | Attending: Physician Assistant | Admitting: Physician Assistant

## 2023-05-26 ENCOUNTER — Telehealth (HOSPITAL_COMMUNITY): Payer: Self-pay | Admitting: Psychiatry

## 2023-05-26 ENCOUNTER — Encounter: Payer: Self-pay | Admitting: Physician Assistant

## 2023-05-26 VITALS — BP 143/93 | HR 72 | Resp 14 | Ht 63.0 in | Wt 134.0 lb

## 2023-05-26 DIAGNOSIS — O1493 Unspecified pre-eclampsia, third trimester: Secondary | ICD-10-CM

## 2023-05-26 DIAGNOSIS — M5030 Other cervical disc degeneration, unspecified cervical region: Secondary | ICD-10-CM

## 2023-05-26 DIAGNOSIS — Z9049 Acquired absence of other specified parts of digestive tract: Secondary | ICD-10-CM

## 2023-05-26 DIAGNOSIS — M249 Joint derangement, unspecified: Secondary | ICD-10-CM

## 2023-05-26 DIAGNOSIS — I7300 Raynaud's syndrome without gangrene: Secondary | ICD-10-CM | POA: Diagnosis not present

## 2023-05-26 DIAGNOSIS — F32A Depression, unspecified: Secondary | ICD-10-CM

## 2023-05-26 DIAGNOSIS — M359 Systemic involvement of connective tissue, unspecified: Secondary | ICD-10-CM

## 2023-05-26 DIAGNOSIS — F419 Anxiety disorder, unspecified: Secondary | ICD-10-CM

## 2023-05-26 DIAGNOSIS — R21 Rash and other nonspecific skin eruption: Secondary | ICD-10-CM

## 2023-05-26 DIAGNOSIS — Z8669 Personal history of other diseases of the nervous system and sense organs: Secondary | ICD-10-CM

## 2023-05-26 DIAGNOSIS — I10 Essential (primary) hypertension: Secondary | ICD-10-CM

## 2023-05-26 DIAGNOSIS — M170 Bilateral primary osteoarthritis of knee: Secondary | ICD-10-CM

## 2023-05-26 DIAGNOSIS — R5383 Other fatigue: Secondary | ICD-10-CM

## 2023-05-26 DIAGNOSIS — Z820 Family history of epilepsy and other diseases of the nervous system: Secondary | ICD-10-CM

## 2023-05-26 DIAGNOSIS — Z8719 Personal history of other diseases of the digestive system: Secondary | ICD-10-CM

## 2023-05-26 DIAGNOSIS — Z79899 Other long term (current) drug therapy: Secondary | ICD-10-CM

## 2023-05-26 DIAGNOSIS — K760 Fatty (change of) liver, not elsewhere classified: Secondary | ICD-10-CM

## 2023-05-26 MED ORDER — HYDROXYZINE HCL 50 MG PO TABS: 50.0000 mg | ORAL_TABLET | Freq: Two times a day (BID) | ORAL | 1 refills | Status: DC | PRN

## 2023-05-26 MED ORDER — HYDROXYCHLOROQUINE SULFATE 200 MG PO TABS: ORAL_TABLET | ORAL | 0 refills | Status: DC

## 2023-05-26 MED ORDER — BETAMETHASONE DIPROPIONATE 0.05 % EX OINT: TOPICAL_OINTMENT | Freq: Two times a day (BID) | CUTANEOUS | 0 refills | Status: AC

## 2023-05-26 NOTE — Telephone Encounter (Signed)
 Refill for hydroxyzine 50 mg BID PRN sent to pharmacy on file.  [name], MD 05/26/23

## 2023-05-27 ENCOUNTER — Telehealth: Payer: Self-pay | Admitting: Physician Assistant

## 2023-05-27 DIAGNOSIS — R7989 Other specified abnormal findings of blood chemistry: Secondary | ICD-10-CM

## 2023-05-27 DIAGNOSIS — K760 Fatty (change of) liver, not elsewhere classified: Secondary | ICD-10-CM

## 2023-05-27 NOTE — Telephone Encounter (Signed)
 Patient contacted the office requesting a referral to Gastroenterology .  Patient request referral for Dr. Amada Jupiter .  Patient's preferred area for referral is: [facility] [place]

## 2023-05-27 NOTE — Telephone Encounter (Signed)
 Ok to place referral to see Dr. Myrtie Neither

## 2023-05-27 NOTE — Telephone Encounter (Signed)
 Referral placed.

## 2023-05-27 NOTE — Progress Notes (Signed)
 No proteinuria.  AST is elevated-59. ALT is WNL.  Rest of CMP WNL.  Patient is currently scheduled for a CT abdomen and will be following up with GI. CBC WNL  ESR WNL Complements WNL

## 2023-05-28 LAB — COMPLETE METABOLIC PANEL WITH GFR
AG Ratio: 1.6 (calc) (ref 1.0–2.5)
ALT: 22 U/L (ref 6–29)
AST: 59 U/L — ABNORMAL HIGH (ref 10–30)
Albumin: 4.6 g/dL (ref 3.6–5.1)
Alkaline phosphatase (APISO): 79 U/L (ref 31–125)
BUN: 7 mg/dL (ref 7–25)
CO2: 25 mmol/L (ref 20–32)
Calcium: 9.6 mg/dL (ref 8.6–10.2)
Chloride: 103 mmol/L (ref 98–110)
Creat: 0.77 mg/dL (ref 0.50–0.99)
Globulin: 2.9 g/dL (ref 1.9–3.7)
Glucose, Bld: 73 mg/dL (ref 65–99)
Potassium: 3.7 mmol/L (ref 3.5–5.3)
Sodium: 137 mmol/L (ref 135–146)
Total Bilirubin: 0.9 mg/dL (ref 0.2–1.2)
Total Protein: 7.5 g/dL (ref 6.1–8.1)
eGFR: 97 mL/min/{1.73_m2} (ref >=60)

## 2023-05-28 LAB — CBC WITH DIFFERENTIAL/PLATELET
Absolute Lymphocytes: 3018 {cells}/uL (ref 850–3900)
Absolute Monocytes: 547 {cells}/uL (ref 200–950)
Basophils Absolute: 43 {cells}/uL (ref 0–200)
Basophils Relative: 0.6 %
Eosinophils Absolute: 99 {cells}/uL (ref 15–500)
Eosinophils Relative: 1.4 %
HCT: 39.4 % (ref 35.0–45.0)
Hemoglobin: 13.3 g/dL (ref 11.7–15.5)
MCH: 29 pg (ref 27.0–33.0)
MCHC: 33.8 g/dL (ref 32.0–36.0)
MCV: 86 fL (ref 80.0–100.0)
MPV: 11.5 fL (ref 7.5–12.5)
Monocytes Relative: 7.7 %
Neutro Abs: 3394 {cells}/uL (ref 1500–7800)
Neutrophils Relative %: 47.8 %
Platelets: 339 10*3/uL (ref 140–400)
RBC: 4.58 10*6/uL (ref 3.80–5.10)
RDW: 12.7 % (ref 11.0–15.0)
Total Lymphocyte: 42.5 %
WBC: 7.1 10*3/uL (ref 3.8–10.8)

## 2023-05-28 LAB — ANA: Anti Nuclear Antibody (ANA): POSITIVE — AB

## 2023-05-28 LAB — ANTI-NUCLEAR AB-TITER (ANA TITER): ANA Titer 1: 1:320 {titer} — ABNORMAL HIGH

## 2023-05-28 LAB — SEDIMENTATION RATE: Sed Rate: 14 mm/h (ref 0–20)

## 2023-05-28 LAB — C3 AND C4
C3 Complement: 153 mg/dL (ref 83–193)
C4 Complement: 28 mg/dL (ref 15–57)

## 2023-05-28 LAB — PROTEIN / CREATININE RATIO, URINE
Creatinine, Urine: 28 mg/dL (ref 20–275)
Total Protein, Urine: <4 mg/dL — ABNORMAL LOW (ref 5–24)

## 2023-05-28 LAB — ANTI-DNA ANTIBODY, DOUBLE-STRANDED: ds DNA Ab: <1 [IU]/mL

## 2023-05-28 NOTE — Progress Notes (Signed)
 ANA remains positive.

## 2023-05-28 NOTE — Progress Notes (Signed)
 dsDNA is negative

## 2023-06-03 ENCOUNTER — Telehealth: Payer: Self-pay | Admitting: Internal Medicine

## 2023-06-03 ENCOUNTER — Encounter: Payer: Self-pay | Admitting: Internal Medicine

## 2023-06-03 NOTE — Telephone Encounter (Signed)
Good afternoon Dr. Rhea Belton,   Patient called stating that a referral had been sent over to our practice from [facility] to be seen for Fatty liver and Elevated LFTs. Patient was advised to schedule with Dr. Myrtie Neither. I advised her that since she was a patient of yours we would need to submit and transfer of care. Patient is requesting transfer of care because she was told Dr. Myrtie Neither specializes in liver.    Will you please review and advise on transfer of care to Dr. Myrtie Neither?   Thank you.

## 2023-06-03 NOTE — Telephone Encounter (Signed)
Dr. Margretta Sidle,   Upon patients previous request, she would like to stay as your patient.   Patient has been scheduled with you on 4/1 at 8:30
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
## 2023-06-09 ENCOUNTER — Telehealth (HOSPITAL_COMMUNITY): Payer: No Payment, Other | Admitting: Psychiatry

## 2023-06-09 ENCOUNTER — Encounter (HOSPITAL_COMMUNITY): Payer: Self-pay | Admitting: Psychiatry

## 2023-06-09 DIAGNOSIS — F410 Panic disorder [episodic paroxysmal anxiety] without agoraphobia: Secondary | ICD-10-CM

## 2023-06-09 DIAGNOSIS — F3342 Major depressive disorder, recurrent, in full remission: Secondary | ICD-10-CM | POA: Diagnosis not present

## 2023-06-09 DIAGNOSIS — F4522 Body dysmorphic disorder: Secondary | ICD-10-CM | POA: Diagnosis not present

## 2023-06-09 DIAGNOSIS — F411 Generalized anxiety disorder: Secondary | ICD-10-CM

## 2023-06-09 DIAGNOSIS — F4321 Adjustment disorder with depressed mood: Secondary | ICD-10-CM | POA: Diagnosis not present

## 2023-06-09 MED ORDER — SERTRALINE HCL 100 MG PO TABS: 150.0000 mg | ORAL_TABLET | Freq: Every day | ORAL | 1 refills | Status: DC

## 2023-06-09 MED ORDER — HYDROXYZINE HCL 50 MG PO TABS: 50.0000 mg | ORAL_TABLET | Freq: Two times a day (BID) | ORAL | 2 refills | Status: AC | PRN

## 2023-06-09 NOTE — Patient Instructions (Signed)
Thank you for attending your appointment today.  -- INCREASE Zoloft to 150 mg daily -- Continue other medications as prescribed.  Please do not make any changes to medications without first discussing with your provider. If you are experiencing a psychiatric emergency, please call 911 or present to your nearest emergency department. Additional crisis, medication management, and therapy resources are included below.  [facility]  [address], [place], [place] [address] [phone] WALK-IN URGENT CARE 24/7 FOR ANYONE [address], [place], [place]  [phone] Fax: [phone] [email or web address] *Interpreters available *Accepts all insurance and uninsured for Urgent Care needs *Accepts Medicaid and uninsured for outpatient treatment (below)      ONLY FOR [facility]  Below:    Outpatient New Patient Assessment/Therapy Walk-ins:        Monday, Wednesday, and Thursday 8am until slots are full (first come, first served)                   New Patient Psychiatry/Medication Management        Monday-Friday 8am-11am (first come, first served)               For all walk-ins we ask that you arrive by 7:15am, because patients will be seen in the order of arrival.

## 2023-07-12 HISTORY — PX: TOOTH EXTRACTION: SUR596

## 2023-07-16 ENCOUNTER — Other Ambulatory Visit (HOSPITAL_COMMUNITY): Payer: Self-pay

## 2023-07-16 MED ORDER — VALSARTAN 80 MG PO TABS
80.0000 mg | ORAL_TABLET | Freq: Every day | ORAL | 1 refills | Status: DC
  Filled 2023-07-16: qty 30, 30d supply, fill #0
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
## 2023-07-28 ENCOUNTER — Encounter (HOSPITAL_COMMUNITY): Payer: Self-pay | Admitting: Psychiatry

## 2023-07-28 ENCOUNTER — Telehealth (INDEPENDENT_AMBULATORY_CARE_PROVIDER_SITE_OTHER): Payer: No Payment, Other | Admitting: Psychiatry

## 2023-07-28 ENCOUNTER — Other Ambulatory Visit (HOSPITAL_COMMUNITY): Payer: Self-pay

## 2023-07-28 DIAGNOSIS — F3342 Major depressive disorder, recurrent, in full remission: Secondary | ICD-10-CM | POA: Diagnosis not present

## 2023-07-28 DIAGNOSIS — F411 Generalized anxiety disorder: Secondary | ICD-10-CM | POA: Diagnosis not present

## 2023-07-28 DIAGNOSIS — F4522 Body dysmorphic disorder: Secondary | ICD-10-CM

## 2023-07-28 DIAGNOSIS — F410 Panic disorder [episodic paroxysmal anxiety] without agoraphobia: Secondary | ICD-10-CM

## 2023-07-28 MED ORDER — SERTRALINE HCL 100 MG PO TABS: 150.0000 mg | ORAL_TABLET | Freq: Every day | ORAL | 1 refills | Status: DC

## 2023-07-28 NOTE — Patient Instructions (Signed)

## 2023-08-12 ENCOUNTER — Encounter: Payer: Self-pay | Admitting: Internal Medicine

## 2023-08-12 ENCOUNTER — Ambulatory Visit: Payer: No Typology Code available for payment source | Admitting: Internal Medicine

## 2023-08-12 VITALS — BP 118/78 | HR 90 | Ht 63.0 in | Wt 140.8 lb

## 2023-08-12 DIAGNOSIS — R748 Abnormal levels of other serum enzymes: Secondary | ICD-10-CM | POA: Diagnosis not present

## 2023-08-12 DIAGNOSIS — Z9049 Acquired absence of other specified parts of digestive tract: Secondary | ICD-10-CM | POA: Diagnosis not present

## 2023-08-12 DIAGNOSIS — R768 Other specified abnormal immunological findings in serum: Secondary | ICD-10-CM

## 2023-08-12 NOTE — Progress Notes (Signed)
 Patient ID: Lindsay Wong, female   DOB: 1981-03-30, 43 y.o.   MRN: [id number] HPI: Lindsay Wong is a 43 year old female who presents for evaluation of possible liver disease.  She has a history of fluctuating liver enzymes, with episodes of elevated liver enzymes noted in the past. Her liver enzymes have been variable, with periods of elevation followed by normalization. She does not consume alcohol or excessive Tylenol. She experiences occasional nausea and a dull ache in the upper abdomen, which resolves spontaneously. A CT scan of the abdomen on June 04, 2023, showed no focal hepatic abnormality and a normal-sized liver. An ultrasound had previously suggested fatty liver. Recent lab work on August 08, 2023, showed normal liver function tests with AST at 14, ALT at 11, and alkaline phosphatase at 74. She has a positive ANA at 1:320, anti-smooth muscle antibody at 39, and mitochondrial antibody positive with M2 antibody at 30.6.  She has been on hydroxychloroquine for about three yearsby rheumatology related to a possible diagnosis of lupus or mixed connective tissue disease. This medication has helped alleviate her symptoms.  She experiences episodes of pruritus, particularly on the soles of her feet and palms, which sometimes become erythematous and pruritic. These episodes have improved since starting hydroxychloroquine. No jaundice, dark urine, or persistent pruritus.  No edema or ascites.  She has a history of cholecystectomy performed on Sep 17, 2022, for gallstones, which showed chronic cholecystitis, cholesterolosis, and cholelithiasis. She reports significant improvement in symptoms post-cholecystectomy.  Her family history includes liver issues in her maternal aunt, who also has a congenital heart defect (Tetralogy of Fallot).  Past Medical History:  Diagnosis Date   Allergy    seasonal   Anemia    Anxiety and depression    per patient diagnosed by psychiatrist   Appendicitis  07/20/2012   Arthritis    Bilateral swelling of feet    resolved   Blood in stool    Chest pain    Chicken pox    Chronic fatigue syndrome    Connective tissue disease (HCC)    Constipation    Depression    postpartum with one child   Essential hypertension 07/13/2021   history no longer taking medication   Fatty liver    GERD 01/31/2007   Qualifier: Diagnosis of  By: Charlsie Quest RMA, [name]     Hx of degenerative disc disease    Hyperlipidemia    postpartum   IBS (irritable bowel syndrome)    Iron deficiency anemia, unspecified    Osteoarthritis    Palpitations    PONV (postoperative nausea and vomiting)    Positive ANA (antinuclear antibody)    RAYNAUD'S DISEASE 01/31/2007   Qualifier: Diagnosis of  By: Charlsie Quest RMA, [name]     Seasonal allergies    Urticaria    Vitamin D deficiency     Past Surgical History:  Procedure Laterality Date   ABDOMINOPLASTY  2015   mini   ANTERIOR FUSION CERVICAL SPINE  07/2020   CESAREAN SECTION     x3   CHOLECYSTECTOMY N/A 09/17/2022   Procedure: LAPAROSCOPIC CHOLECYSTECTOMY;  Surgeon: Berna Bue, MD;  Location: [facility];  Service: General;  Laterality: N/A;   COLONOSCOPY  01/2020   LAPAROSCOPIC APPENDECTOMY Right 07/20/2012   Procedure: APPENDECTOMY LAPAROSCOPIC;  Surgeon: Velora Heckler, MD;  Location: [facility];  Service: General;  Laterality: Right;   WISDOM TOOTH EXTRACTION      Outpatient Medications Prior to Visit  Medication Sig  Dispense Refill   Calcium-Magnesium-Vitamin D (CITRACAL SLOW RELEASE PO) Take 1 tablet by mouth daily.     Ferrous Sulfate (IRON PO) Take 65 mg by mouth 3 (three) times a week.     hydroxychloroquine (PLAQUENIL) 200 MG tablet Take 1 tablet 200 mg BID Monday-Friday 120 tablet 0   hydrOXYzine (ATARAX) 50 MG tablet Take 1 tablet (50 mg total) by mouth 2 (two) times daily as needed for anxiety (sleep). 60 tablet 2   levocetirizine (XYZAL) 5 MG tablet 1 tablet in the evening Orally twice per day     sertraline  (ZOLOFT) 100 MG tablet Take 1.5 tablets (150 mg total) by mouth daily. 135 tablet 1   valsartan (DIOVAN) 80 MG tablet Take 1 tablet (80 mg total) by mouth daily. 30 tablet 1   VITAMIN D PO Take 2,000 Units by mouth daily.      esomeprazole (NEXIUM) 40 MG capsule TAKE 1 CAPSULE BY MOUTH ONCE A DAY (Patient taking differently: Take by mouth every morning.) 30 capsule 0   levocetirizine (XYZAL) 5 MG tablet TAKE 1 TABLET BY MOUTH EVERY EVENING AS NEEDED FOR ITCHING (Patient taking differently: Take 5 mg by mouth 2 (two) times daily.) 30 tablet 0   amLODipine (NORVASC) 2.5 MG tablet TAKE 1 TABLET BY MOUTH DAILY (Patient not taking: Reported on 09/02/2022) 30 tablet 2   metFORMIN (GLUCOPHAGE) 500 MG tablet Take 1 tablet by mouth daily with dinner (Patient not taking: Reported on 01/30/2022) 30 tablet 0   propranolol (INDERAL) 10 MG tablet TAKE ONE TO TWO TABLETS BY MOUTH UP TO TWICE DAILY AS NEEDED FOR ANXIETY / PANIC ATTACKS (Patient not taking: Reported on 07/28/2023) 60 tablet 1   No facility-administered medications prior to visit.    Allergies  Allergen Reactions   Clarithromycin Shortness Of Breath    REACTION: hives   Polyethylene Glycol 2000 Dimyristoyl Glycerol Anaphylaxis   Latex     Skin irritation from gloves    Family History  Problem Relation Age of Onset   Hyperlipidemia Mother    Early menopause Mother    Depression Mother    Anxiety disorder Mother    Other Father        no contact since infant   Alcoholism Father    Drug abuse Father    Raynaud syndrome Sister    Anxiety disorder Sister    Depression Sister    Cancer Maternal Grandmother        melanoma   Stroke Maternal Grandmother 18   Arthritis Maternal Grandmother    Hyperlipidemia Maternal Grandmother    Hypertension Maternal Grandmother    Breast cancer Maternal Grandmother 72   Cancer Maternal Grandfather        bladder   Bladder Cancer Maternal Grandfather    Autism Son    Healthy Son    Healthy Son     Healthy Son    Immunodeficiency Maternal Aunt    Heart attack Maternal Aunt 49   CAD Maternal Aunt    Sleep apnea Maternal Aunt    Lupus Maternal Uncle    Leukemia Maternal Uncle    Multiple sclerosis Cousin    Sleep apnea Niece    Lung cancer Other        all but one of grandmother's siblings    Colon cancer Neg Hx    Esophageal cancer Neg Hx     Social History   Tobacco Use   Smoking status: Never    Passive exposure: Past  Smokeless tobacco: Never  Vaping Use   Vaping status: Never Used  Substance Use Topics   Alcohol use: No   Drug use: No    ROS: As per history of present illness, otherwise negative  BP 118/78   Pulse 90   Ht 5\' 3"  (1.6 m)   Wt 140 lb 12.8 oz (63.9 kg)   BMI 24.94 kg/m  Gen: awake, alert, NAD HEENT: anicteric  Abd: soft, NT/ND, +BS throughout, no HSM Ext: no c/c/e Neuro: nonfocal   RELEVANT LABS AND IMAGING: CBC    Component Value Date/Time   WBC 7.1 05/26/2023 1425   RBC 4.58 05/26/2023 1425   HGB 13.3 05/26/2023 1425   HCT 39.4 05/26/2023 1425   PLT 339 05/26/2023 1425   MCV 86.0 05/26/2023 1425   MCH 29.0 05/26/2023 1425   MCHC 33.8 05/26/2023 1425   RDW 12.7 05/26/2023 1425   LYMPHSABS 2,088 01/30/2022 0927   MONOABS 0.5 06/30/2020 1421   EOSABS 99 05/26/2023 1425   BASOSABS 43 05/26/2023 1425    CMP     Component Value Date/Time   NA 137 05/26/2023 1425   K 3.7 05/26/2023 1425   CL 103 05/26/2023 1425   CO2 25 05/26/2023 1425   GLUCOSE 73 05/26/2023 1425   BUN 7 05/26/2023 1425   CREATININE 0.77 05/26/2023 1425   CALCIUM 9.6 05/26/2023 1425   PROT 7.5 05/26/2023 1425   ALBUMIN 4.3 06/30/2020 1421   AST 59 (H) 05/26/2023 1425   ALT 22 05/26/2023 1425   ALKPHOS 76 06/30/2020 1421   BILITOT 0.9 05/26/2023 1425   GFRNONAA 102 06/19/2020 1403   GFRAA 119 06/19/2020 1403   LABS AST: 14 (08/08/2023) ALT: 11 (08/08/2023) Albumin: 4.7 (08/08/2023) Total bilirubin: 0.6 (08/08/2023) Alkaline phosphatase: 74  (08/08/2023) Hemoglobin A1c: 5.0 (08/08/2023) Hemoglobin: 13.7 (08/08/2023) Hematocrit: 40.3 (08/08/2023) Platelet count: 306 (08/08/2023) White count: 7.2 (08/08/2023) ANA: 1:320 (05/26/2023) ESR: Negative (05/26/2023) C3: Negative (05/26/2023) C4: Negative (05/26/2023) Anti-dsDNA: Negative (05/26/2023) Anti-smooth muscle antibody: 39 (03/2023) Mitochondrial antibody: Positive (03/2023) M2 antibody: 30.6 (03/2023) B12: 443 (03/2023)   Latest Reference Range & Units 04/16/21 15:11 07/10/21 10:30 01/30/22 09:27 05/26/23 14:25  AST 10 - 30 U/L 13 14 25  59 (H)  ALT 6 - 29 U/L 13 15 36 (H) 22  Total Protein 6.1 - 8.1 g/dL 7.4 7.5 8.2 (H) 7.5  Total Bilirubin 0.2 - 1.2 mg/dL 0.6 0.4 0.7 0.9   RADIOLOGY CT abdomen: No focal hepatic abnormality; liver normal in size; no hydronephrosis or renal calculi; no suspicious renal mass; status post cholecystectomy with no biliary ductal dilatation; pancreas unremarkable; gastrointestinal tract unremarkable (06/04/2023)  DIAGNOSTIC Colonoscopy: Firm distal rectal polyp, 10mm, semi-pedunculated; small internal hemorrhoids (02/08/2020)  PATHOLOGY Rectal polyp biopsy: Squamous mucosal benign with parakeratosis (02/08/2020)  ASSESSMENT/PLAN: Possible autoimmune liver disease Fluctuating liver enzymes and positive anti-smooth muscle and mitochondrial antibodies.  This can be associated autoimmune liver diseases such as primary biliary cholangitis and autoimmune hepatitis. Current liver function tests (AST, ALT, bilirubin, alkaline phosphatase) are normal, and imaging shows no liver abnormalities. The antibodies raise suspicion for autoimmune liver disease, but the absence of consistent enzyme elevation and normal imaging suggest no current liver damage.  Certainly no clinical evidence for advanced or decompensated liver disease.  A liver biopsy may be necessary if persistent elevations occur, with a less than 1% risk of bleeding, but it provides  significant diagnostic information. - Monitor liver enzymes (AST, ALT, bilirubin, alkaline phosphatase) regularly, every 6 months. - Consider liver  biopsy if liver enzymes show persistent elevation. - Coordinate with Dr. Corliss Skains to receive lab results every six months.  Mixed connective tissue disease vs. systemic lupus erythematosus (SLE) She is treated with hydroxychloroquine for symptoms suggestive of mixed connective tissue disease or SLE, which has been effective in managing symptoms. - Continue hydroxychloroquine as prescribed. - Monitor for symptoms and coordinate care with rheumatology.  Post-cholecystectomy status She underwent cholecystectomy in May 2024 for gallstones, which resolved her symptoms. There are no current complications related to the surgery.  Follow-up Regular follow-up is planned to monitor liver function and autoimmune markers, ensuring early detection of any changes. Annual follow-up with gastroenterology is advised, with lab results from Dr. Corliss Skains forwarded every six months. - Schedule annual follow-up with gastroenterology. - Ensure lab results from Dr. Denton Meek are forwarded to the gastroenterologist every six months.    UJ:WJXB, Orie Fisherman, Pa-c [address] [place],  [place] [address]

## 2023-08-12 NOTE — Patient Instructions (Signed)
 Follow up with Dr. Rhea Belton in one year.   _______________________________________________________  If your blood pressure at your visit was 140/90 or greater, please contact your primary care physician to follow up on this.  _______________________________________________________  If you are age 45 or older, your body mass index should be between 23-30. Your Body mass index is 24.94 kg/m. If this is out of the aforementioned range listed, please consider follow up with your Primary Care Provider.  If you are age 45 or younger, your body mass index should be between 19-25. Your Body mass index is 24.94 kg/m. If this is out of the aformentioned range listed, please consider follow up with your Primary Care Provider.   ________________________________________________________  The [facility] providers would like to encourage you to use [facility] to communicate with providers for non-urgent requests or questions.  Due to long hold times on the telephone, sending your provider a message by [facility] may be a faster and more efficient way to get a response.  Please allow 48 business hours for a response.  Please remember that this is for non-urgent requests.  _______________________________________________________

## 2023-08-19 ENCOUNTER — Other Ambulatory Visit: Payer: Self-pay | Admitting: *Deleted

## 2023-08-19 MED ORDER — HYDROXYCHLOROQUINE SULFATE 200 MG PO TABS: ORAL_TABLET | ORAL | 0 refills | Status: DC

## 2023-08-19 NOTE — Telephone Encounter (Addendum)
 Refill request received via fax from [facility] [address]  for Plaquenil   Last Fill: 05/26/2023  Eye exam: 03/27/2022 WNL (Patient has an appointment 09/22/2023 for PLQ eye exam)  Labs: 08/08/2023 Sodium 135  Next Visit: 10/30/2023  Last Visit: 05/26/2023  DX: Autoimmune disease   Current Dose per office note 05/26/2023: Plaquenil 200 mg 1 tablet by mouth twice daily M-F.   Okay to refill Plaquenil?

## 2023-08-20 ENCOUNTER — Other Ambulatory Visit (HOSPITAL_COMMUNITY): Payer: Self-pay

## 2023-08-20 MED ORDER — ROSUVASTATIN CALCIUM 10 MG PO TABS
10.0000 mg | ORAL_TABLET | Freq: Every day | ORAL | 1 refills | Status: DC
  Filled 2023-08-20: qty 90, 90d supply, fill #0

## 2023-08-26 ENCOUNTER — Other Ambulatory Visit (HOSPITAL_COMMUNITY): Payer: Self-pay

## 2023-09-25 NOTE — Progress Notes (Signed)
 Patient did not connect for virtual psychiatric medication management appointment on 09/29/23 at 10:30AM. Sent secure video link with no response. Called phone with no answer; left VM with callback number to reschedule.  Adell Hones, MD 09/29/23

## 2023-09-29 ENCOUNTER — Other Ambulatory Visit (HOSPITAL_COMMUNITY): Payer: Self-pay | Admitting: Psychiatry

## 2023-09-29 ENCOUNTER — Encounter (HOSPITAL_COMMUNITY): Admitting: Psychiatry

## 2023-09-29 ENCOUNTER — Other Ambulatory Visit: Payer: Self-pay | Admitting: *Deleted

## 2023-09-29 ENCOUNTER — Telehealth: Payer: Self-pay | Admitting: *Deleted

## 2023-09-29 DIAGNOSIS — M359 Systemic involvement of connective tissue, unspecified: Secondary | ICD-10-CM

## 2023-09-29 DIAGNOSIS — Z79899 Other long term (current) drug therapy: Secondary | ICD-10-CM

## 2023-09-29 MED ORDER — HYDROXYZINE HCL 50 MG PO TABS: 50.0000 mg | ORAL_TABLET | Freq: Two times a day (BID) | ORAL | 2 refills | Status: DC | PRN

## 2023-09-29 NOTE — Progress Notes (Unsigned)
 Office Visit Note  Patient: Lindsay Wong             Date of Birth: 12/25/1980           MRN: [id number]             PCP: Elester Grim, MD Referring: Elester Grim, MD Visit Date: 10/09/2023 Occupation: @GUAROCC @  Subjective:  No chief complaint on file.   History of Present Illness: Lindsay Wong is a 43 y.o. female ***     Activities of Daily Living:  Patient reports morning stiffness for *** {minute/hour:19697}.   Patient {ACTIONS;DENIES/REPORTS:21021675::"Denies"} nocturnal pain.  Difficulty dressing/grooming: {ACTIONS;DENIES/REPORTS:21021675::"Denies"} Difficulty climbing stairs: {ACTIONS;DENIES/REPORTS:21021675::"Denies"} Difficulty getting out of chair: {ACTIONS;DENIES/REPORTS:21021675::"Denies"} Difficulty using hands for taps, buttons, cutlery, and/or writing: {ACTIONS;DENIES/REPORTS:21021675::"Denies"}  No Rheumatology ROS completed.   PMFS History:  Patient Active Problem List   Diagnosis Date Noted   Body dysmorphic disorder 09/02/2022   Generalized anxiety disorder with panic attacks 09/02/2022   MDD (major depressive disorder), recurrent, in full remission (HCC) 09/02/2022   Insulin resistance 09/24/2021   Class 1 obesity with serious comorbidity and body mass index (BMI) of 30.0 to 30.9 in adult 09/24/2021   Benign essential hypertension 07/13/2021   Foraminal stenosis of cervical region 07/04/2020   Adverse effect of other viral vaccines, subsequent encounter 01/27/2020   Spinal stenosis of cervical region 12/28/2019   Fatigue 08/20/2019   Left shoulder pain 07/19/2014   RAYNAUD'S DISEASE 01/31/2007    Past Medical History:  Diagnosis Date   Allergy    seasonal   Anemia    Anxiety and depression    per patient diagnosed by psychiatrist   Appendicitis 07/20/2012   Arthritis    Bilateral swelling of feet    resolved   Blood in stool    Chest pain    Chicken pox    Chronic fatigue syndrome    Connective tissue disease (HCC)     Constipation    Depression    postpartum with one child   Essential hypertension 07/13/2021   history no longer taking medication   Fatty liver    GERD 01/31/2007   Qualifier: Diagnosis of  By: Georganne Kind RMA, [name]     Hx of degenerative disc disease    Hyperlipidemia    postpartum   IBS (irritable bowel syndrome)    Iron deficiency anemia, unspecified    Osteoarthritis    Palpitations    PONV (postoperative nausea and vomiting)    Positive ANA (antinuclear antibody)    RAYNAUD'S DISEASE 01/31/2007   Qualifier: Diagnosis of  By: Georganne Kind RMA, [name]     Seasonal allergies    Urticaria    Vitamin D  deficiency     Family History  Problem Relation Age of Onset   Hyperlipidemia Mother    Early menopause Mother    Depression Mother    Anxiety disorder Mother    Other Father        no contact since infant   Alcoholism Father    Drug abuse Father    Raynaud syndrome Sister    Anxiety disorder Sister    Depression Sister    Cancer Maternal Grandmother        melanoma   Stroke Maternal Grandmother 30   Arthritis Maternal Grandmother    Hyperlipidemia Maternal Grandmother    Hypertension Maternal Grandmother    Breast cancer Maternal Grandmother 72   Cancer Maternal Grandfather        bladder   Bladder  Cancer Maternal Grandfather    Autism Son    Healthy Son    Healthy Son    Healthy Son    Immunodeficiency Maternal Aunt    Heart attack Maternal Aunt 65   CAD Maternal Aunt    Sleep apnea Maternal Aunt    Lupus Maternal Uncle    Leukemia Maternal Uncle    Multiple sclerosis Cousin    Sleep apnea Niece    Lung cancer Other        all but one of grandmother's siblings    Colon cancer Neg Hx    Esophageal cancer Neg Hx    Past Surgical History:  Procedure Laterality Date   ABDOMINOPLASTY  2015   mini   ANTERIOR FUSION CERVICAL SPINE  07/2020   CESAREAN SECTION     x3   CHOLECYSTECTOMY N/A 09/17/2022   Procedure: LAPAROSCOPIC CHOLECYSTECTOMY;  Surgeon: Adalberto Acton,  MD;  Location: [facility];  Service: General;  Laterality: N/A;   COLONOSCOPY  01/2020   LAPAROSCOPIC APPENDECTOMY Right 07/20/2012   Procedure: APPENDECTOMY LAPAROSCOPIC;  Surgeon: Keitha Pata, MD;  Location: [facility];  Service: General;  Laterality: Right;   WISDOM TOOTH EXTRACTION     Social History   Social History Narrative   Work or School: homemaker, 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,7 and 3 (2014)      Home Situation: lives with husband and children      Spiritual Beliefs: none      Lifestyle: hot yoga (used to), weights and cardio, tries to eat a healthy diet      Lives in a 2 story home      Right handed      Caffeine: 24-36 oz pepsi zero per day    Immunization History  Administered Date(s) Administered   Influenza Split 01/04/2011   Influenza Whole 04/07/2006, 02/01/2008   Influenza,inj,Quad PF,6+ Mos 01/14/2014, 02/08/2015, 02/20/2016, 02/12/2017, 02/13/2018, 03/01/2019, 03/05/2021   Influenza-Unspecified 01/11/2013, 02/11/2016, 03/13/2020   Janssen (J&J) SARS-COV-2 Vaccination 01/28/2020, 04/29/2020   PFIZER(Purple Top)SARS-COV-2 Vaccination 08/02/2019   Tdap 03/01/2019     Objective: Vital Signs: There were no vitals taken for this visit.   Physical Exam   Musculoskeletal Exam: ***  CDAI Exam: CDAI Score: -- Patient Global: --; Provider Global: -- Swollen: --; Tender: -- Joint Exam 10/09/2023   No joint exam has been documented for this visit   There is currently no information documented on the homunculus. Go to the Rheumatology activity and complete the homunculus joint exam.  Investigation: No additional findings.  Imaging: No results found.  Recent Labs: Lab Results  Component Value Date   WBC 7.1 05/26/2023   HGB 13.3 05/26/2023   PLT 339 05/26/2023   NA 137 05/26/2023   K 3.7 05/26/2023   CL 103 05/26/2023   CO2 25 05/26/2023   GLUCOSE 73 05/26/2023   BUN 7 05/26/2023   CREATININE 0.77 05/26/2023   BILITOT 0.9 05/26/2023   ALKPHOS 76 06/30/2020   AST  59 (H) 05/26/2023   ALT 22 05/26/2023   PROT 7.5 05/26/2023   ALBUMIN 4.3 06/30/2020   CALCIUM  9.6 05/26/2023   GFRAA 119 06/19/2020    Speciality Comments: PLQ Eye Exam: 09/22/2023 WNL @ [facility] [employer] [facility] Follow up in 1 year    Procedures:  No procedures performed Allergies: Clarithromycin, Polyethylene glycol 2000 dimyristoyl glycerol, and Latex   Assessment / Plan:     Visit Diagnoses: No diagnosis found.  Orders: No orders  of the defined types were placed in this encounter.  No orders of the defined types were placed in this encounter.   Face-to-face time spent with patient was *** minutes. Greater than 50% of time was spent in counseling and coordination of care.  Follow-Up Instructions: No follow-ups on file.   Dee Farber, CMA  Note - This record has been created using Animal nutritionist.  Chart creation errors have been sought, but may not always  have been located. Such creation errors do not reflect on  the standard of medical care.

## 2023-09-29 NOTE — Telephone Encounter (Signed)
 Patient advised we have placed lab order for her. Patient states she will come tomorrow for labs.

## 2023-09-29 NOTE — Telephone Encounter (Signed)
Please clarify what "symptoms " she is having  

## 2023-09-29 NOTE — Telephone Encounter (Signed)
 Patient contacted the office and states she has not felt real well and would like to have her labs checked. Patient has moved her appointment up to 10/09/2023. Please advise what labs we should order for her.

## 2023-09-29 NOTE — Telephone Encounter (Signed)
 Patient states she has been feeling achy in her thighs, elbows and wrists. Patient states she has been having muscle pain. Patient states she has been having nausea and fatigue. Patient denies any joint swelling.

## 2023-09-29 NOTE — Telephone Encounter (Signed)
 Ok to recheck ESR, CRP, RF, anti-CCP, ANA, dsDNA, complements, CBC, and CMP.

## 2023-09-29 NOTE — Addendum Note (Signed)
 Addended by: Adrianne Horn on: 09/29/2023 01:27 PM   Modules accepted: Orders

## 2023-09-30 ENCOUNTER — Ambulatory Visit: Payer: Self-pay | Admitting: Physician Assistant

## 2023-09-30 NOTE — Progress Notes (Signed)
 CBC and CMP stable.  ESR and CRP WNL RF negative  Complements WNL

## 2023-10-01 NOTE — Progress Notes (Signed)
dsDNA is negative.  ANA pending.

## 2023-10-02 LAB — COMPREHENSIVE METABOLIC PANEL WITH GFR
AG Ratio: 1.6 (calc) (ref 1.0–2.5)
ALT: 8 U/L (ref 6–29)
AST: 13 U/L (ref 10–35)
Albumin: 4.3 g/dL (ref 3.6–5.1)
Alkaline phosphatase (APISO): 76 U/L (ref 31–125)
BUN/Creatinine Ratio: 7 (calc) (ref 6–22)
BUN: 6 mg/dL — ABNORMAL LOW (ref 7–25)
CO2: 28 mmol/L (ref 20–32)
Calcium: 9.5 mg/dL (ref 8.6–10.2)
Chloride: 103 mmol/L (ref 98–110)
Creat: 0.88 mg/dL (ref 0.50–0.99)
Globulin: 2.7 g/dL (ref 1.9–3.7)
Glucose, Bld: 84 mg/dL (ref 65–99)
Potassium: 3.9 mmol/L (ref 3.5–5.3)
Sodium: 139 mmol/L (ref 135–146)
Total Bilirubin: 0.5 mg/dL (ref 0.2–1.2)
Total Protein: 7 g/dL (ref 6.1–8.1)
eGFR: 83 mL/min/{1.73_m2} (ref >=60)

## 2023-10-02 LAB — C3 AND C4
C3 Complement: 157 mg/dL (ref 83–193)
C4 Complement: 27 mg/dL (ref 15–57)

## 2023-10-02 LAB — CBC WITH DIFFERENTIAL/PLATELET
Absolute Lymphocytes: 2899 {cells}/uL (ref 850–3900)
Absolute Monocytes: 482 {cells}/uL (ref 200–950)
Basophils Absolute: 47 {cells}/uL (ref 0–200)
Basophils Relative: 0.6 %
Eosinophils Absolute: 182 {cells}/uL (ref 15–500)
Eosinophils Relative: 2.3 %
HCT: 39.8 % (ref 35.0–45.0)
Hemoglobin: 12.6 g/dL (ref 11.7–15.5)
MCH: 27.8 pg (ref 27.0–33.0)
MCHC: 31.7 g/dL — ABNORMAL LOW (ref 32.0–36.0)
MCV: 87.9 fL (ref 80.0–100.0)
MPV: 10.7 fL (ref 7.5–12.5)
Monocytes Relative: 6.1 %
Neutro Abs: 4290 {cells}/uL (ref 1500–7800)
Neutrophils Relative %: 54.3 %
Platelets: 358 10*3/uL (ref 140–400)
RBC: 4.53 10*6/uL (ref 3.80–5.10)
RDW: 13.6 % (ref 11.0–15.0)
Total Lymphocyte: 36.7 %
WBC: 7.9 10*3/uL (ref 3.8–10.8)

## 2023-10-02 LAB — ANA: Anti Nuclear Antibody (ANA): POSITIVE — AB

## 2023-10-02 LAB — ANTI-NUCLEAR AB-TITER (ANA TITER): ANA Titer 1: 1:80 {titer} — ABNORMAL HIGH

## 2023-10-02 LAB — CYCLIC CITRUL PEPTIDE ANTIBODY, IGG: Cyclic Citrullin Peptide Ab: <16 U

## 2023-10-02 LAB — SEDIMENTATION RATE: Sed Rate: 17 mm/h (ref 0–20)

## 2023-10-02 LAB — RHEUMATOID FACTOR: Rheumatoid fact SerPl-aCnc: <10 [IU]/mL (ref <14)

## 2023-10-02 LAB — C-REACTIVE PROTEIN: CRP: <3 mg/L (ref <8.0)

## 2023-10-02 LAB — ANTI-DNA ANTIBODY, DOUBLE-STRANDED: ds DNA Ab: <1 [IU]/mL

## 2023-10-02 NOTE — Progress Notes (Signed)
 ANA remains positive. Results will be discussed in detail at upcoming visit.

## 2023-10-09 ENCOUNTER — Encounter: Payer: Self-pay | Admitting: Rheumatology

## 2023-10-09 ENCOUNTER — Ambulatory Visit: Attending: Rheumatology | Admitting: Rheumatology

## 2023-10-09 VITALS — BP 115/83 | HR 71 | Resp 15 | Ht 63.0 in | Wt 147.0 lb

## 2023-10-09 DIAGNOSIS — I7300 Raynaud's syndrome without gangrene: Secondary | ICD-10-CM

## 2023-10-09 DIAGNOSIS — Z79899 Other long term (current) drug therapy: Secondary | ICD-10-CM

## 2023-10-09 DIAGNOSIS — I10 Essential (primary) hypertension: Secondary | ICD-10-CM

## 2023-10-09 DIAGNOSIS — O1493 Unspecified pre-eclampsia, third trimester: Secondary | ICD-10-CM

## 2023-10-09 DIAGNOSIS — M359 Systemic involvement of connective tissue, unspecified: Secondary | ICD-10-CM | POA: Diagnosis not present

## 2023-10-09 DIAGNOSIS — M249 Joint derangement, unspecified: Secondary | ICD-10-CM

## 2023-10-09 DIAGNOSIS — K760 Fatty (change of) liver, not elsewhere classified: Secondary | ICD-10-CM

## 2023-10-09 DIAGNOSIS — R21 Rash and other nonspecific skin eruption: Secondary | ICD-10-CM

## 2023-10-09 DIAGNOSIS — Z820 Family history of epilepsy and other diseases of the nervous system: Secondary | ICD-10-CM

## 2023-10-09 DIAGNOSIS — F32A Depression, unspecified: Secondary | ICD-10-CM

## 2023-10-09 DIAGNOSIS — M170 Bilateral primary osteoarthritis of knee: Secondary | ICD-10-CM | POA: Diagnosis not present

## 2023-10-09 DIAGNOSIS — Z8669 Personal history of other diseases of the nervous system and sense organs: Secondary | ICD-10-CM

## 2023-10-09 DIAGNOSIS — Z9049 Acquired absence of other specified parts of digestive tract: Secondary | ICD-10-CM

## 2023-10-09 DIAGNOSIS — M5030 Other cervical disc degeneration, unspecified cervical region: Secondary | ICD-10-CM

## 2023-10-09 DIAGNOSIS — Z8719 Personal history of other diseases of the digestive system: Secondary | ICD-10-CM

## 2023-10-09 DIAGNOSIS — R5383 Other fatigue: Secondary | ICD-10-CM

## 2023-10-09 DIAGNOSIS — M3500 Sicca syndrome, unspecified: Secondary | ICD-10-CM

## 2023-10-09 DIAGNOSIS — F419 Anxiety disorder, unspecified: Secondary | ICD-10-CM

## 2023-10-09 NOTE — Patient Instructions (Addendum)
 [facility]- Phone: [phone]  Vaccines You are taking a medication(s) that can suppress your immune system.  The following immunizations are recommended: Flu annually Covid-19  Td/Tdap (tetanus, diphtheria, pertussis) every 10 years Pneumonia (Prevnar 15 then Pneumovax 23 at least 1 year apart.  Alternatively, can take Prevnar 20 without needing additional dose) Shingrix: 2 doses from 4 weeks to 6 months apart  Please check with your PCP to make sure you are up to date.

## 2023-10-30 ENCOUNTER — Ambulatory Visit: Payer: No Typology Code available for payment source | Admitting: Rheumatology

## 2023-11-14 NOTE — Progress Notes (Signed)
 Please let patient know that her CMP and cholesterol labs were normal.  Patient's vitamin D  is still mildly low (23.0) and would recommend to increase her vitamin D  supplement from 2,000 international units daily to 4,000 international units daily and will continue to observe.  Call for any questions/concerns.
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
## 2023-11-26 ENCOUNTER — Encounter (HOSPITAL_COMMUNITY): Payer: Self-pay | Admitting: Psychiatry

## 2023-11-26 ENCOUNTER — Telehealth (INDEPENDENT_AMBULATORY_CARE_PROVIDER_SITE_OTHER): Admitting: Psychiatry

## 2023-11-26 DIAGNOSIS — F4522 Body dysmorphic disorder: Secondary | ICD-10-CM

## 2023-11-26 DIAGNOSIS — F411 Generalized anxiety disorder: Secondary | ICD-10-CM | POA: Diagnosis not present

## 2023-11-26 DIAGNOSIS — F3342 Major depressive disorder, recurrent, in full remission: Secondary | ICD-10-CM | POA: Diagnosis not present

## 2023-11-26 DIAGNOSIS — F410 Panic disorder [episodic paroxysmal anxiety] without agoraphobia: Secondary | ICD-10-CM | POA: Diagnosis not present

## 2023-11-26 MED ORDER — SERTRALINE HCL 100 MG PO TABS
150.0000 mg | ORAL_TABLET | Freq: Every day | ORAL | 1 refills | Status: DC
Start: 2023-11-26 — End: 2024-03-08

## 2023-11-26 MED ORDER — HYDROXYZINE HCL 50 MG PO TABS: 50.0000 mg | ORAL_TABLET | Freq: Two times a day (BID) | ORAL | 2 refills | Status: AC | PRN

## 2023-11-26 NOTE — Patient Instructions (Signed)

## 2023-12-30 ENCOUNTER — Other Ambulatory Visit: Payer: Self-pay | Admitting: Rheumatology

## 2023-12-30 NOTE — Telephone Encounter (Signed)
 Last Fill: 08/19/2023  Eye exam: 09/22/2023 WNL   Labs: 11/12/2023 CMP WNL 09/29/2023 CBC and CMP stable.  ESR and CRP WNL  RF negative  Complements WNL   Next Visit: 03/11/2024  Last Visit: 10/09/2023  IK:Jlunpfflwz disease   Current Dose per office note 10/09/2023: Plaquenil  200 mg 1 tablet by mouth twice daily M-F   Okay to refill Plaquenil ?

## 2024-02-16 ENCOUNTER — Other Ambulatory Visit: Payer: Self-pay

## 2024-02-16 DIAGNOSIS — M359 Systemic involvement of connective tissue, unspecified: Secondary | ICD-10-CM

## 2024-02-16 DIAGNOSIS — Z79899 Other long term (current) drug therapy: Secondary | ICD-10-CM

## 2024-02-17 LAB — CBC WITH DIFFERENTIAL/PLATELET
Absolute Lymphocytes: 2239 {cells}/uL (ref 850–3900)
Absolute Monocytes: 523 {cells}/uL (ref 200–950)
Basophils Absolute: 62 {cells}/uL (ref 0–200)
Basophils Relative: 0.8 %
Eosinophils Absolute: 179 {cells}/uL (ref 15–500)
Eosinophils Relative: 2.3 %
HCT: 38.5 % (ref 35.0–45.0)
Hemoglobin: 12.3 g/dL (ref 11.7–15.5)
MCH: 27.6 pg (ref 27.0–33.0)
MCHC: 31.9 g/dL — ABNORMAL LOW (ref 32.0–36.0)
MCV: 86.5 fL (ref 80.0–100.0)
MPV: 11.5 fL (ref 7.5–12.5)
Monocytes Relative: 6.7 %
Neutro Abs: 4797 {cells}/uL (ref 1500–7800)
Neutrophils Relative %: 61.5 %
Platelets: 322 Thousand/uL (ref 140–400)
RBC: 4.45 Million/uL (ref 3.80–5.10)
RDW: 14.2 % (ref 11.0–15.0)
Total Lymphocyte: 28.7 %
WBC: 7.8 Thousand/uL (ref 3.8–10.8)

## 2024-02-17 LAB — COMPREHENSIVE METABOLIC PANEL WITH GFR
AG Ratio: 1.4 (calc) (ref 1.0–2.5)
ALT: 11 U/L (ref 6–29)
AST: 14 U/L (ref 10–35)
Albumin: 4.2 g/dL (ref 3.6–5.1)
Alkaline phosphatase (APISO): 96 U/L (ref 31–125)
BUN: 10 mg/dL (ref 7–25)
CO2: 25 mmol/L (ref 20–32)
Calcium: 9.2 mg/dL (ref 8.6–10.2)
Chloride: 104 mmol/L (ref 98–110)
Creat: 0.91 mg/dL (ref 0.50–0.99)
Globulin: 3 g/dL (ref 1.9–3.7)
Glucose, Bld: 90 mg/dL (ref 65–99)
Potassium: 3.8 mmol/L (ref 3.5–5.3)
Sodium: 138 mmol/L (ref 135–146)
Total Bilirubin: 0.5 mg/dL (ref 0.2–1.2)
Total Protein: 7.2 g/dL (ref 6.1–8.1)
eGFR: 79 mL/min/1.73m2 (ref >=60)

## 2024-02-17 LAB — ANTI-DNA ANTIBODY, DOUBLE-STRANDED: ds DNA Ab: <1 [IU]/mL

## 2024-02-17 LAB — SEDIMENTATION RATE: Sed Rate: 25 mm/h — ABNORMAL HIGH (ref 0–20)

## 2024-02-17 LAB — C3 AND C4
C3 Complement: 190 mg/dL (ref 83–193)
C4 Complement: 29 mg/dL (ref 15–57)

## 2024-02-17 LAB — PROTEIN / CREATININE RATIO, URINE
Creatinine, Urine: 120 mg/dL (ref 20–275)
Protein/Creat Ratio: 42 mg/g{creat} (ref 24–184)
Protein/Creatinine Ratio: 0.042 mg/mg{creat} (ref 0.024–0.184)
Total Protein, Urine: 5 mg/dL (ref 5–24)

## 2024-02-18 ENCOUNTER — Ambulatory Visit: Payer: Self-pay | Admitting: Rheumatology

## 2024-02-18 NOTE — Progress Notes (Signed)
 CBC and CMP are stable sed rate is mildly elevated at 25, complements normal, double-stranded DNA negative, urine protein creatinine ratio normal.  Labs do not indicate an autoimmune disease flare.

## 2024-02-23 NOTE — Progress Notes (Unsigned)
 43 y.o. No obstetric history on file. Married Caucasian female here for annual exam.    PCP: Lazoff, Shawn P, DO   No LMP recorded.           Sexually active: Yes.    The current method of family planning is vasectomy.    Menopausal hormone therapy:  n/a Exercising: {yes no:314532}  {types:19826} Smoker:  no  OB History  No obstetric history on file.     HEALTH MAINTENANCE: Last 2 paps:  11/27/20 ASCUS, HR HPV neg, 12/29/19 ASCUS, HR HPV neg  History of abnormal Pap or positive HPV:  yes Mammogram:   12/25/23 BIRADS Cat 1 neg (Care everywhere) Colonoscopy:  02/08/20  Bone Density:  n/a  Result  n/a   Immunization History  Administered Date(s) Administered   Influenza Split 01/04/2011   Influenza Whole 04/07/2006, 02/01/2008   Influenza,inj,Quad PF,6+ Mos 01/14/2014, 02/08/2015, 02/20/2016, 02/12/2017, 02/13/2018, 03/01/2019, 03/05/2021   Influenza-Unspecified 01/11/2013, 02/11/2016, 03/13/2020   Janssen (J&J) SARS-COV-2 Vaccination 01/28/2020, 04/29/2020   PFIZER(Purple Top)SARS-COV-2 Vaccination 08/02/2019   Tdap 03/01/2019      reports that she has never smoked. She has been exposed to tobacco smoke. She has never used smokeless tobacco. She reports that she does not drink alcohol and does not use drugs.  Past Medical History:  Diagnosis Date   Allergy    seasonal   Anemia    Anxiety and depression    per patient diagnosed by psychiatrist   Appendicitis 07/20/2012   Arthritis    Bilateral swelling of feet    resolved   Blood in stool    Chest pain    Chicken pox    Chronic fatigue syndrome    Connective tissue disease    Constipation    Depression    postpartum with one child   Essential hypertension 07/13/2021   history no longer taking medication   Fatty liver    GERD 01/31/2007   Qualifier: Diagnosis of  By: Wilhemina RMA, [name]     Hx of degenerative disc disease    Hyperlipidemia    postpartum   IBS (irritable bowel syndrome)    Iron deficiency  anemia, unspecified    Osteoarthritis    Palpitations    PONV (postoperative nausea and vomiting)    Positive ANA (antinuclear antibody)    RAYNAUD'S DISEASE 01/31/2007   Qualifier: Diagnosis of  By: Wilhemina RMA, [name]     Seasonal allergies    Urticaria    Vitamin D  deficiency     Past Surgical History:  Procedure Laterality Date   ABDOMINOPLASTY  2015   mini   ANTERIOR FUSION CERVICAL SPINE  07/2020   CESAREAN SECTION     x3   CHOLECYSTECTOMY N/A 09/17/2022   Procedure: LAPAROSCOPIC CHOLECYSTECTOMY;  Surgeon: Signe Mitzie LABOR, MD;  Location: [facility];  Service: General;  Laterality: N/A;   COLONOSCOPY  01/2020   LAPAROSCOPIC APPENDECTOMY Right 07/20/2012   Procedure: APPENDECTOMY LAPAROSCOPIC;  Surgeon: Krystal CHRISTELLA Spinner, MD;  Location: [facility];  Service: General;  Laterality: Right;   TOOTH EXTRACTION  07/2023   WISDOM TOOTH EXTRACTION      Current Outpatient Medications  Medication Sig Dispense Refill   Calcium -Magnesium -Vitamin D  (CITRACAL SLOW RELEASE PO) Take 1 tablet by mouth daily.     esomeprazole  (NEXIUM ) 40 MG capsule TAKE 1 CAPSULE BY MOUTH ONCE A DAY (Patient taking differently: Take by mouth every morning.) 30 capsule 0   Ferrous Sulfate (IRON PO) Take 65 mg by mouth  3 (three) times a week.     hydroxychloroquine  (PLAQUENIL ) 200 MG tablet TAKE 1 TABLET BY MOUTH TWICE DAILY MONDAY THROUGH FRIDAY 120 tablet 0   hydrOXYzine  (ATARAX ) 50 MG tablet Take 1 tablet (50 mg total) by mouth 2 (two) times daily as needed for anxiety (or sleep). 60 tablet 2   levocetirizine (XYZAL ) 5 MG tablet TAKE 1 TABLET BY MOUTH EVERY EVENING AS NEEDED FOR ITCHING (Patient taking differently: Take 5 mg by mouth 2 (two) times daily.) 30 tablet 0   levocetirizine (XYZAL ) 5 MG tablet 1 tablet in the evening Orally twice per day     sertraline  (ZOLOFT ) 100 MG tablet Take 1.5 tablets (150 mg total) by mouth daily. 135 tablet 1   valsartan  (DIOVAN ) 80 MG tablet Take 1 tablet (80 mg total) by mouth daily.  30 tablet 1   VITAMIN D  PO Take 2,000 Units by mouth daily.  (Patient taking differently: Take 4,000 Units by mouth daily.)     No current facility-administered medications for this visit.    ALLERGIES: Clarithromycin, Polyethylene glycol 2000 dimyristoyl glycerol, and Latex  Family History  Problem Relation Age of Onset   Hyperlipidemia Mother    Early menopause Mother    Depression Mother    Anxiety disorder Mother    Other Father        no contact since infant   Alcoholism Father    Drug abuse Father    Raynaud syndrome Sister    Anxiety disorder Sister    Depression Sister    Cancer Maternal Grandmother        melanoma   Stroke Maternal Grandmother 26   Arthritis Maternal Grandmother    Hyperlipidemia Maternal Grandmother    Hypertension Maternal Grandmother    Breast cancer Maternal Grandmother 72   Cancer Maternal Grandfather        bladder   Bladder Cancer Maternal Grandfather    Autism Son    Healthy Son    Healthy Son    Healthy Son    Immunodeficiency Maternal Aunt    Heart attack Maternal Aunt 49   CAD Maternal Aunt    Sleep apnea Maternal Aunt    Lupus Maternal Uncle    Leukemia Maternal Uncle    Multiple sclerosis Cousin    Sleep apnea Niece    Lung cancer Other        all but one of grandmother's siblings    Colon cancer Neg Hx    Esophageal cancer Neg Hx     Review of Systems  PHYSICAL EXAM:  There were no vitals taken for this visit.    General appearance: alert, cooperative and appears stated age Head: normocephalic, without obvious abnormality, atraumatic Neck: no adenopathy, supple, symmetrical, trachea midline and thyroid  normal to inspection and palpation Lungs: clear to auscultation bilaterally Breasts: normal appearance, no masses or tenderness, No nipple retraction or dimpling, No nipple discharge or bleeding, No axillary adenopathy Heart: regular rate and rhythm Abdomen: soft, non-tender; no masses, no organomegaly Extremities:  extremities normal, atraumatic, no cyanosis or edema Skin: skin color, texture, turgor normal. No rashes or lesions Lymph nodes: cervical, supraclavicular, and axillary nodes normal. Neurologic: grossly normal  Pelvic: External genitalia:  no lesions              No abnormal inguinal nodes palpated.              Urethra:  normal appearing urethra with no masses, tenderness or lesions  Bartholins and Skenes: normal                 Vagina: normal appearing vagina with normal color and discharge, no lesions              Cervix: no lesions              Pap taken: {yes no:314532} Bimanual Exam:  Uterus:  normal size, contour, position, consistency, mobility, non-tender              Adnexa: no mass, fullness, tenderness              Rectal exam: {yes no:314532}.  Confirms.              Anus:  normal sphincter tone, no lesions  Chaperone was present for exam:  {BSCHAPERONE:31226::Emily F, CMA}  ASSESSMENT: Well woman visit with gynecologic exam.  PHQ-2-9: ***  ***  PLAN: Mammogram screening discussed. Self breast awareness reviewed. Pap and HRV collected:  {yes no:314532} Guidelines for Calcium , Vitamin D , regular exercise program including cardiovascular and weight bearing exercise. Medication refills:  *** {LABS (Optional):23779} Follow up:  ***    Additional counseling given.  {yes [id number]. ***  total time was spent for this patient encounter, including preparation, face-to-face counseling with the patient, coordination of care, and documentation of the encounter in addition to doing the well woman visit with gynecologic exam.

## 2024-02-24 ENCOUNTER — Encounter: Payer: Self-pay | Admitting: Obstetrics and Gynecology

## 2024-02-24 ENCOUNTER — Ambulatory Visit: Admitting: Obstetrics and Gynecology

## 2024-02-24 VITALS — BP 120/82 | HR 91 | Ht 63.25 in | Wt 167.0 lb

## 2024-02-24 DIAGNOSIS — R9389 Abnormal findings on diagnostic imaging of other specified body structures: Secondary | ICD-10-CM | POA: Diagnosis not present

## 2024-02-24 DIAGNOSIS — Z01419 Encounter for gynecological examination (general) (routine) without abnormal findings: Secondary | ICD-10-CM | POA: Diagnosis not present

## 2024-02-24 DIAGNOSIS — N920 Excessive and frequent menstruation with regular cycle: Secondary | ICD-10-CM

## 2024-02-24 DIAGNOSIS — Z1331 Encounter for screening for depression: Secondary | ICD-10-CM

## 2024-02-24 NOTE — Patient Instructions (Signed)

## 2024-02-25 ENCOUNTER — Telehealth (HOSPITAL_COMMUNITY): Admitting: Psychiatry

## 2024-02-26 NOTE — Progress Notes (Signed)
 Office Visit Note  Patient: Lindsay Wong             Date of Birth: 1980/05/28           MRN: [id number]             PCP: Macarthur Elouise SQUIBB, DO Referring: Lazoff, Shawn P, DO Visit Date: 03/11/2024 Occupation: Data Unavailable  Subjective:  Discuss lab results   History of Present Illness: Lindsay Wong is a 43 y.o. female with history of autoimmune disease and osteoarthritis.  Patient remains on  Plaquenil  200 mg 1 tablet by mouth twice daily M-F.  She is tolerating Plaquenil  without any side effects.  She has not had any gaps in therapy.  She denies any signs or symptoms of an autoimmune disease flare.  She denies any morning stiffness, nocturnal pain, or difficulty performing ADLs.  Her energy level has been stable.  She denies any recent rashes.  She has not had any oral or nasal ulcerations.  She denies any sicca symptoms.  She has not had any pleuritic chest pain or or shortness of breath. He denies any new medical conditions.  He denies any recent or recurrent infections. Patient reports that she will be undergoing upcoming cosmetic surgery and has requested to have some preoperative labs ordered.   Activities of Daily Living:  Patient reports morning stiffness for 0 minute.   Patient Denies nocturnal pain.  Difficulty dressing/grooming: Denies Difficulty climbing stairs: Reports Difficulty getting out of chair: Denies Difficulty using hands for taps, buttons, cutlery, and/or writing: Denies  Review of Systems  Constitutional:  Positive for fatigue.  HENT:  Negative for mouth sores and mouth dryness.   Eyes:  Negative for dryness.  Respiratory:  Negative for shortness of breath.   Cardiovascular:  Negative for chest pain and palpitations.  Gastrointestinal:  Negative for blood in stool, constipation and diarrhea.  Endocrine: Negative for increased urination.  Genitourinary:  Negative for involuntary urination.  Musculoskeletal:  Negative for joint pain, gait problem,  joint pain, joint swelling, myalgias, muscle weakness, morning stiffness, muscle tenderness and myalgias.  Skin:  Positive for sensitivity to sunlight. Negative for color change, rash and hair loss.  Allergic/Immunologic: Negative for susceptible to infections.  Neurological:  Negative for dizziness and headaches.  Hematological:  Negative for swollen glands.  Psychiatric/Behavioral:  Positive for sleep disturbance. Negative for depressed mood. The patient is not nervous/anxious.     PMFS History:  Patient Active Problem List   Diagnosis Date Noted   Body dysmorphic disorder 09/02/2022   Generalized anxiety disorder with panic attacks 09/02/2022   MDD (major depressive disorder), recurrent, in full remission 09/02/2022   Insulin resistance 09/24/2021   Class 1 obesity with serious comorbidity and body mass index (BMI) of 30.0 to 30.9 in adult 09/24/2021   Benign essential hypertension 07/13/2021   Foraminal stenosis of cervical region 07/04/2020   Adverse effect of other viral vaccines, subsequent encounter 01/27/2020   Spinal stenosis of cervical region 12/28/2019   Fatigue 08/20/2019   Left shoulder pain 07/19/2014   RAYNAUD'S DISEASE 01/31/2007    Past Medical History:  Diagnosis Date   Allergy    seasonal   Anemia    Anxiety and depression    per patient diagnosed by psychiatrist   Appendicitis 07/20/2012   Arthritis    Bilateral swelling of feet    resolved   Blood in stool    Chest pain    Chicken pox    Chronic  fatigue syndrome    Connective tissue disease    Constipation    Depression    postpartum with one child   Essential hypertension 07/13/2021   history no longer taking medication   Fatty liver    GERD 01/31/2007   Qualifier: Diagnosis of  By: Wilhemina RMA, [name]     Hx of degenerative disc disease    Hyperlipidemia    postpartum   IBS (irritable bowel syndrome)    Iron deficiency anemia, unspecified    Osteoarthritis    Palpitations    PONV  (postoperative nausea and vomiting)    Positive ANA (antinuclear antibody)    RAYNAUD'S DISEASE 01/31/2007   Qualifier: Diagnosis of  By: Wilhemina RMA, [name]     Seasonal allergies    Urticaria    Vitamin D  deficiency     Family History  Problem Relation Age of Onset   Hyperlipidemia Mother    Early menopause Mother    Depression Mother    Anxiety disorder Mother    Other Father        no contact since infant   Alcoholism Father    Drug abuse Father    Raynaud syndrome Sister    Anxiety disorder Sister    Depression Sister    Immunodeficiency Maternal Aunt    Heart attack Maternal Aunt 7   CAD Maternal Aunt    Sleep apnea Maternal Aunt    Lupus Maternal Uncle    Leukemia Maternal Uncle    Cancer Maternal Grandmother        melanoma   Stroke Maternal Grandmother 16   Arthritis Maternal Grandmother    Hyperlipidemia Maternal Grandmother    Hypertension Maternal Grandmother    Breast cancer Maternal Grandmother 72   Cancer Maternal Grandfather        bladder   Bladder Cancer Maternal Grandfather    Autism Son    Healthy Son    Healthy Son    Multiple sclerosis Cousin    Sleep apnea Niece    Lung cancer Other        all but one of grandmother's siblings    Friedreich's ataxia Niece/Nephew    Colon cancer Neg Hx    Esophageal cancer Neg Hx    Past Surgical History:  Procedure Laterality Date   ABDOMINOPLASTY  2015   mini   ANTERIOR FUSION CERVICAL SPINE  07/2020   CESAREAN SECTION     x3   CHOLECYSTECTOMY N/A 09/17/2022   Procedure: LAPAROSCOPIC CHOLECYSTECTOMY;  Surgeon: Signe Mitzie LABOR, MD;  Location: [facility];  Service: General;  Laterality: N/A;   COLONOSCOPY  01/2020   LAPAROSCOPIC APPENDECTOMY Right 07/20/2012   Procedure: APPENDECTOMY LAPAROSCOPIC;  Surgeon: Krystal CHRISTELLA Spinner, MD;  Location: [facility];  Service: General;  Laterality: Right;   TOOTH EXTRACTION  07/2023   WISDOM TOOTH EXTRACTION     Social History   Tobacco Use   Smoking status: Never     Passive exposure: Past   Smokeless tobacco: Never  Vaping Use   Vaping status: Never Used  Substance Use Topics   Alcohol use: No   Drug use: No   Social History   Social History Narrative   Work or School: homemaker, 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,7 and 3 (2014)      Home Situation: lives with husband and children      Spiritual Beliefs: none      Lifestyle: hot yoga (used to), weights and cardio, tries to eat a healthy diet  Lives in a 2 story home      Right handed      Caffeine: 24-36 oz pepsi zero per day      Immunization History  Administered Date(s) Administered   Influenza Split 01/04/2011   Influenza Whole 04/07/2006, 02/01/2008   Influenza,inj,Quad PF,6+ Mos 01/14/2014, 02/08/2015, 02/20/2016, 02/12/2017, 02/13/2018, 03/01/2019, 03/05/2021   Influenza-Unspecified 01/11/2013, 02/11/2016, 03/13/2020   Janssen (J&J) SARS-COV-2 Vaccination 01/28/2020, 04/29/2020   PFIZER(Purple Top)SARS-COV-2 Vaccination 08/02/2019   Tdap 03/01/2019     Objective: Vital Signs: BP 116/80   Pulse 83   Temp 98.1 F (36.7 C)   Resp 14   Ht 5' 3 (1.6 m)   Wt 164 lb 12.8 oz (74.8 kg)   LMP 02/22/2024 (Exact Date)   BMI 29.19 kg/m    Physical Exam Vitals and nursing note reviewed.  Constitutional:      Appearance: She is well-developed.  HENT:     Head: Normocephalic and atraumatic.  Eyes:     Conjunctiva/sclera: Conjunctivae normal.  Cardiovascular:     Rate and Rhythm: Normal rate and regular rhythm.     Heart sounds: Normal heart sounds.  Pulmonary:     Effort: Pulmonary effort is normal.     Breath sounds: Normal breath sounds.  Abdominal:     General: Bowel sounds are normal.     Palpations: Abdomen is soft.  Musculoskeletal:     Cervical back: Normal range of motion.  Lymphadenopathy:     Cervical: No cervical adenopathy.  Skin:    General: Skin is warm and dry.     Capillary Refill: Capillary refill takes less than 2 seconds.  Neurological:     Mental Status: She is  alert and oriented to person, place, and time.  Psychiatric:        Behavior: Behavior normal.      Musculoskeletal Exam: C-spine, thoracic spine, lumbar spine have good range of motion.  No midline spinal tenderness.  No SI joint tenderness.  Shoulder joints, elbow joints, wrist joints, MCPs, PIPs, DIPs have good range of motion with no synovitis.  Complete fist formation bilaterally.  Hip joints have good range of motion with no groin pain.  Knee joints have good range of motion no warmth or effusion.  Ankle joints have good range of motion no tenderness or joint swelling.  No evidence of Achilles tendinitis or plantar fasciitis.   CDAI Exam: CDAI Score: -- Patient Global: --; Provider Global: -- Swollen: --; Tender: -- Joint Exam 03/11/2024   No joint exam has been documented for this visit   There is currently no information documented on the homunculus. Go to the Rheumatology activity and complete the homunculus joint exam.  Investigation: No additional findings.  Imaging: No results found.  Recent Labs: Lab Results  Component Value Date   WBC 7.8 02/16/2024   HGB 12.3 02/16/2024   PLT 322 02/16/2024   NA 138 02/16/2024   K 3.8 02/16/2024   CL 104 02/16/2024   CO2 25 02/16/2024   GLUCOSE 90 02/16/2024   BUN 10 02/16/2024   CREATININE 0.91 02/16/2024   BILITOT 0.5 02/16/2024   ALKPHOS 76 06/30/2020   AST 14 02/16/2024   ALT 11 02/16/2024   PROT 7.2 02/16/2024   ALBUMIN 4.3 06/30/2020   CALCIUM  9.2 02/16/2024   GFRAA 119 06/19/2020    Speciality Comments: PLQ Eye Exam: 03/08/2024 WNL @ [facility] [employer] (note in [place]). Follow up in 1 year. Plaquenil  started  February 25, 2020   Procedures:  No procedures performed Allergies: Clarithromycin, Polyethylene glycol 2000 dimyristoyl glycerol, and Latex   Assessment / Plan:     Visit Diagnoses: Autoimmune disease - ANA 1: 640 speckled pattern, ENA negative, C3-C4  normal, anticardiolipin, beta-2, LA-,  History of fatigue,oral ulcers, arthralgias, photosensitivity, sicca: She has not had any signs or symptoms of an autoimmune disease flare.  She is clinically been doing well taking Plaquenil  200 mg 1 tablet by mouth twice daily medically Friday.  She is tolerating Plaquenil  without any side effects and has not had any gaps in therapy.  She has no synovitis on examination today.  No recent rashes.  No oral or nasal ulcerations.  No sicca symptoms.  She has not had any recent symptoms of Raynaud's phenomenon.  She has clinically been doing well on the current treatment regimen. Lab work from 02/16/24 was reviewed today in the office: ESR 25, dsDNA negative, complements WNL, and urine protein creatinine ratio WNL.  No medication changes will be made at this time.  She was advised to notify us  if she develops signs or symptoms of a flare.  She will follow-up in the office in 5 months or sooner if needed.  High risk medication use - Plaquenil  200 mg 1 tablet by mouth twice daily M-F.  CBC and CMP updated on 02/16/24.  PLQ Eye Exam: 03/08/2024 WNL @ [facility] (note in [place]). Follow up in 1 year.  No recent or recurrent infections.   Raynaud's syndrome without gangrene: She has not had any recent symptoms of Raynaud's phenomenon.  No digital ulcerations or signs of gangrene were noted.  No signs of sclerodactyly noted.  Primary osteoarthritis of both knees - Moderate OA-currently not symptomatic.  She has good range of motion of both knee joints on examination today.  No warmth or effusion noted.  DDD (degenerative disc disease), cervical - S/P fusion 03/22 by Dr.Cram.  Doing well.  She has good range of motion of the cervical spine with no discomfort at this time.  No symptoms of radiculopathy.  Other fatigue -Plan to update the following lab work today-results will be sent to Dr. Royal. plan: TSH, T4,  T3  Hypermobility of joint  Other medical conditions are listed as follows:  History of esophageal reflux  Hx of cholecystectomy  Fatty liver - History of elevated LFTs.  Updated Right upper quadrant ultrasound concerning for fatty liver and nephrocalcinosis.  Patient was evaluated by GI.  Anxiety and depression  Benign essential hypertension: Blood pressure was 116/80 today in the office.  History of migraine  Family history of multiple sclerosis  Preoperative testing -Patient will be undergoing liposuction and fat grafting under the care of Dr. Royal in December 2025.   From a rheumatologic standpoint-ok to proceed with surgical intervention.  Results will be forwarded to Dr. Gronet.   Plan: HgB A1c, INR/PT, Factor 5 leiden, PTT, TSH, T4, T3  Pre-operative laboratory examination -Orders for the following labs were released today.  Results will be forwarded to Dr. Gronet. Plan: HgB A1c, INR/PT, Factor 5 leiden, PTT, TSH, T4, T3  Orders: Orders Placed This Encounter  Procedures   HgB A1c   INR/PT   Factor 5 leiden   PTT   TSH   T4   T3   No orders of the defined types were placed in this encounter.   Follow-Up Instructions: Return in about 5 months (around 08/09/2024) for Autoimmune Disease.  Waddell CHRISTELLA Craze, PA-C  Note - This record has been created using Dragon software.  Chart creation errors have been sought, but may not always  have been located. Such creation errors do not reflect on  the standard of medical care.
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
## 2024-03-05 NOTE — Progress Notes (Signed)
 GYNECOLOGY  VISIT   HPI: 43 y.o.   Married  Caucasian female   G3P0 with Patient's last menstrual period was 02/22/2024 (exact date).   here for: Pap smear.  At her annual exam she was on her menstrual period, and the pap could not be collected.     May be interested in a Mirena IUD.   Returns in one month to do endometrial biopsy.     GYNECOLOGIC HISTORY: Patient's last menstrual period was 02/22/2024 (exact date). Contraception:  Vasectomy  Menopausal hormone therapy:  n/a Last 2 paps:  11/27/20 ASCUS, HR HPV neg, 12/29/19 ASCUS, HR HPV neg  History of abnormal Pap or positive HPV:  yes Mammogram:  12/25/23 [facility] Cat 1 neg (Care everywhere)         OB History     Gravida  3   Para      Term      Preterm      AB      Living  3      SAB      IAB      Ectopic      Multiple      Live Births  3              Patient Active Problem List   Diagnosis Date Noted   Body dysmorphic disorder 09/02/2022   Generalized anxiety disorder with panic attacks 09/02/2022   MDD (major depressive disorder), recurrent, in full remission 09/02/2022   Insulin resistance 09/24/2021   Class 1 obesity with serious comorbidity and body mass index (BMI) of 30.0 to 30.9 in adult 09/24/2021   Benign essential hypertension 07/13/2021   Foraminal stenosis of cervical region 07/04/2020   Adverse effect of other viral vaccines, subsequent encounter 01/27/2020   Spinal stenosis of cervical region 12/28/2019   Fatigue 08/20/2019   Left shoulder pain 07/19/2014   RAYNAUD'S DISEASE 01/31/2007    Past Medical History:  Diagnosis Date   Allergy    seasonal   Anemia    Anxiety and depression    per patient diagnosed by psychiatrist   Appendicitis 07/20/2012   Arthritis    Bilateral swelling of feet    resolved   Blood in stool    Chest pain    Chicken pox    Chronic fatigue syndrome    Connective tissue disease    Constipation    Depression    postpartum with one child    Essential hypertension 07/13/2021   history no longer taking medication   Fatty liver    GERD 01/31/2007   Qualifier: Diagnosis of  By: Wilhemina RMA, [name]     Hx of degenerative disc disease    Hyperlipidemia    postpartum   IBS (irritable bowel syndrome)    Iron deficiency anemia, unspecified    Osteoarthritis    Palpitations    PONV (postoperative nausea and vomiting)    Positive ANA (antinuclear antibody)    RAYNAUD'S DISEASE 01/31/2007   Qualifier: Diagnosis of  By: Wilhemina RMA, [name]     Seasonal allergies    Urticaria    Vitamin D  deficiency     Past Surgical History:  Procedure Laterality Date   ABDOMINOPLASTY  2015   mini   ANTERIOR FUSION CERVICAL SPINE  07/2020   CESAREAN SECTION     x3   CHOLECYSTECTOMY N/A 09/17/2022   Procedure: LAPAROSCOPIC CHOLECYSTECTOMY;  Surgeon: Signe Mitzie LABOR, MD;  Location: [facility];  Service: General;  Laterality: N/A;  COLONOSCOPY  01/2020   LAPAROSCOPIC APPENDECTOMY Right 07/20/2012   Procedure: APPENDECTOMY LAPAROSCOPIC;  Surgeon: Krystal CHRISTELLA Spinner, MD;  Location: [facility];  Service: General;  Laterality: Right;   TOOTH EXTRACTION  07/2023   WISDOM TOOTH EXTRACTION      Current Outpatient Medications  Medication Sig Dispense Refill   Calcium -Magnesium -Vitamin D  (CITRACAL SLOW RELEASE PO) Take 1 tablet by mouth daily.     esomeprazole  (NEXIUM ) 40 MG capsule TAKE 1 CAPSULE BY MOUTH ONCE A DAY (Patient taking differently: Take by mouth every morning.) 30 capsule 0   Ferrous Sulfate (IRON PO) Take 65 mg by mouth 3 (three) times a week.     hydroxychloroquine  (PLAQUENIL ) 200 MG tablet TAKE 1 TABLET BY MOUTH TWICE DAILY MONDAY THROUGH FRIDAY 120 tablet 0   hydrOXYzine  (ATARAX ) 50 MG tablet Take 1 tablet (50 mg total) by mouth daily as needed for anxiety. 30 tablet 2   levocetirizine (XYZAL ) 5 MG tablet 1 tablet in the evening Orally twice per day     rosuvastatin  (CRESTOR ) 10 MG tablet Take 1 tablet (10 mg total) by mouth daily. 90 tablet 1    sertraline  (ZOLOFT ) 100 MG tablet Take 2 tablets (200 mg total) by mouth daily. 180 tablet 1   valsartan  (DIOVAN ) 40 MG tablet Take 40 mg by mouth daily.     VITAMIN D  PO Take 2,000 Units by mouth daily.  (Patient taking differently: Take 4,000 Units by mouth daily.)     No current facility-administered medications for this visit.     ALLERGIES: Clarithromycin, Polyethylene glycol 2000 dimyristoyl glycerol, and Latex  Family History  Problem Relation Age of Onset   Hyperlipidemia Mother    Early menopause Mother    Depression Mother    Anxiety disorder Mother    Other Father        no contact since infant   Alcoholism Father    Drug abuse Father    Raynaud syndrome Sister    Anxiety disorder Sister    Depression Sister    Immunodeficiency Maternal Aunt    Heart attack Maternal Aunt 16   CAD Maternal Aunt    Sleep apnea Maternal Aunt    Lupus Maternal Uncle    Leukemia Maternal Uncle    Cancer Maternal Grandmother        melanoma   Stroke Maternal Grandmother 70   Arthritis Maternal Grandmother    Hyperlipidemia Maternal Grandmother    Hypertension Maternal Grandmother    Breast cancer Maternal Grandmother 72   Cancer Maternal Grandfather        bladder   Bladder Cancer Maternal Grandfather    Autism Son    Healthy Son    Healthy Son    Multiple sclerosis Cousin    Sleep apnea Niece    Lung cancer Other        all but one of grandmother's siblings    Friedreich's ataxia Niece/Nephew    Colon cancer Neg Hx    Esophageal cancer Neg Hx     Social History   Socioeconomic History   Marital status: Married    Spouse name: Not on file   Number of children: 3   Years of education: Not on file   Highest education level: Bachelor's degree (e.g., BA, AB, BS)  Occupational History   Occupation: Stay at home spouse   Occupation: firefighter  Tobacco Use   Smoking status: Never    Passive exposure: Past   Smokeless tobacco: Never  Vaping Use  Vaping status: Never  Used  Substance and Sexual Activity   Alcohol use: No   Drug use: No   Sexual activity: Not Currently    Comment: husband had vasectomy  Other Topics Concern   Not on file  Social History Narrative   Work or School: homemaker, 3 kids - 9,7 and 3 (2014)      Home Situation: lives with husband and children      Spiritual Beliefs: none      Lifestyle: hot yoga (used to), weights and cardio, tries to eat a healthy diet      Lives in a 2 story home      Right handed      Caffeine: 24-36 oz pepsi zero per day    Social Drivers of Health   Financial Resource Strain: Low Risk  (07/12/2021)   Overall Financial Resource Strain (CARDIA)    Difficulty of Paying Living Expenses: Not hard at all  Food Insecurity: Low Risk  (07/14/2023)   Received from [facility]   Hunger Vital Sign    Within the past 12 months, you worried that your food would run out before you got money to buy more: Never true    Within the past 12 months, the food you bought just didn't last and you didn't have money to get more. : Never true  Transportation Needs: No Transportation Needs (07/14/2023)   Received from [employer]    In the past 12 months, has lack of reliable transportation kept you from medical appointments, meetings, work or from getting things needed for daily living? : No  Physical Activity: Inactive (11/12/2022)   Exercise Vital Sign    Days of Exercise per Week: 0 days    Minutes of Exercise per Session: 0 min  Stress: No Stress Concern Present (07/12/2021)   [employer] of Occupational [employer] - Occupational Stress Questionnaire    Feeling of Stress : Only a little  Social Connections: Unknown (04/09/2022)   [facility]   Social Network    Social Network: Not on file  Intimate Partner Violence: Not At Risk (11/12/2022)   Humiliation, Afraid, Rape, and Kick questionnaire    Fear of Current or Ex-Partner: No    Emotionally Abused: No    Physically Abused: No     Sexually Abused: No    Review of Systems  All other systems reviewed and are negative.   PHYSICAL EXAMINATION:   BP 114/74 (BP Location: Left Arm, Patient Position: Sitting)   Pulse 89   LMP 02/22/2024 (Exact Date)   SpO2 99%     General appearance: alert, cooperative and appears stated age  Pelvic: External genitalia:  no lesions              Urethra:  normal appearing urethra with no masses, tenderness or lesions              Bartholins and Skenes: normal                 Vagina: normal appearing vagina with normal color and discharge, no lesions              Cervix: no lesions                Bimanual Exam:  Uterus:  normal size, contour, position, consistency, mobility, non-tender              Adnexa: no mass, fullness, tenderness  Chaperone was present for exam:  Kari HERO, CMA  ASSESSMENT:  Cervical cancer screening.  Hx ASCUS, neg HR HPV.  Thickened endometrium on pelvic US .  Menorrhagia with regular menses.  Dysmenorrhea.   PLAN:  Pap and HR HPV collected today.  Brochure on Mirena IUD.  Return for endometrial biopsy next month.   12 min  total time was spent for this patient encounter, including preparation, face-to-face counseling with the patient, coordination of care, and documentation of the encounter.

## 2024-03-08 ENCOUNTER — Telehealth (INDEPENDENT_AMBULATORY_CARE_PROVIDER_SITE_OTHER): Admitting: Psychiatry

## 2024-03-08 ENCOUNTER — Encounter (HOSPITAL_COMMUNITY): Payer: Self-pay | Admitting: Psychiatry

## 2024-03-08 DIAGNOSIS — F3342 Major depressive disorder, recurrent, in full remission: Secondary | ICD-10-CM | POA: Diagnosis not present

## 2024-03-08 DIAGNOSIS — F410 Panic disorder [episodic paroxysmal anxiety] without agoraphobia: Secondary | ICD-10-CM

## 2024-03-08 DIAGNOSIS — F411 Generalized anxiety disorder: Secondary | ICD-10-CM

## 2024-03-08 DIAGNOSIS — F4522 Body dysmorphic disorder: Secondary | ICD-10-CM

## 2024-03-08 MED ORDER — HYDROXYZINE HCL 50 MG PO TABS: 50.0000 mg | ORAL_TABLET | Freq: Every day | ORAL | 2 refills | Status: AC | PRN

## 2024-03-08 MED ORDER — SERTRALINE HCL 100 MG PO TABS: 200.0000 mg | ORAL_TABLET | Freq: Every day | ORAL | 1 refills | Status: AC

## 2024-03-08 NOTE — Patient Instructions (Signed)
 Thank you for attending your appointment today.  -- INCREASE Zoloft  to 200 mg daily -- Continue other medications as prescribed.  Please do not make any changes to medications without first discussing with your provider. If you are experiencing a psychiatric emergency, please call 911 or present to your nearest emergency department. Additional crisis, medication management, and therapy resources are included below.  [facility]  [address] [place], [place] [address] [phone] WALK-IN URGENT CARE 24/7 FOR ANYONE [address], [place], [place]  [phone] Fax: [phone] [email or web address] *Interpreters available *Accepts all insurance and uninsured for Urgent Care needs *Accepts Medicaid and uninsured for outpatient treatment (below)      ONLY FOR [facility]  Below:    Outpatient New Patient Assessment/Therapy Walk-ins:        Monday, Wednesday, and Thursday 8am until slots are full (first come, first served)                   New Patient Psychiatry/Medication Management        Monday-Friday 8am-11am (first come, first served)               For all walk-ins we ask that you arrive by 7:15am, because patients will be seen in the order of arrival.

## 2024-03-09 ENCOUNTER — Ambulatory Visit (INDEPENDENT_AMBULATORY_CARE_PROVIDER_SITE_OTHER): Admitting: Obstetrics and Gynecology

## 2024-03-09 ENCOUNTER — Other Ambulatory Visit (HOSPITAL_COMMUNITY)
Admission: RE | Admit: 2024-03-09 | Discharge: 2024-03-09 | Disposition: A | Source: Ambulatory Visit | Attending: Obstetrics and Gynecology | Admitting: Obstetrics and Gynecology

## 2024-03-09 ENCOUNTER — Encounter: Payer: Self-pay | Admitting: Obstetrics and Gynecology

## 2024-03-09 VITALS — BP 114/74 | HR 89

## 2024-03-09 DIAGNOSIS — Z124 Encounter for screening for malignant neoplasm of cervix: Secondary | ICD-10-CM | POA: Diagnosis not present

## 2024-03-09 DIAGNOSIS — Z8742 Personal history of other diseases of the female genital tract: Secondary | ICD-10-CM | POA: Diagnosis not present

## 2024-03-11 ENCOUNTER — Ambulatory Visit: Attending: Physician Assistant | Admitting: Physician Assistant

## 2024-03-11 ENCOUNTER — Encounter: Payer: Self-pay | Admitting: Physician Assistant

## 2024-03-11 VITALS — BP 116/80 | HR 83 | Temp 98.1°F | Resp 14 | Ht 63.0 in | Wt 164.8 lb

## 2024-03-11 DIAGNOSIS — Z79899 Other long term (current) drug therapy: Secondary | ICD-10-CM

## 2024-03-11 DIAGNOSIS — M170 Bilateral primary osteoarthritis of knee: Secondary | ICD-10-CM | POA: Diagnosis not present

## 2024-03-11 DIAGNOSIS — I7300 Raynaud's syndrome without gangrene: Secondary | ICD-10-CM

## 2024-03-11 DIAGNOSIS — R5383 Other fatigue: Secondary | ICD-10-CM

## 2024-03-11 DIAGNOSIS — Z01818 Encounter for other preprocedural examination: Secondary | ICD-10-CM

## 2024-03-11 DIAGNOSIS — M5030 Other cervical disc degeneration, unspecified cervical region: Secondary | ICD-10-CM

## 2024-03-11 DIAGNOSIS — Z8669 Personal history of other diseases of the nervous system and sense organs: Secondary | ICD-10-CM

## 2024-03-11 DIAGNOSIS — K760 Fatty (change of) liver, not elsewhere classified: Secondary | ICD-10-CM

## 2024-03-11 DIAGNOSIS — Z8719 Personal history of other diseases of the digestive system: Secondary | ICD-10-CM

## 2024-03-11 DIAGNOSIS — Z820 Family history of epilepsy and other diseases of the nervous system: Secondary | ICD-10-CM

## 2024-03-11 DIAGNOSIS — Z9049 Acquired absence of other specified parts of digestive tract: Secondary | ICD-10-CM

## 2024-03-11 DIAGNOSIS — F419 Anxiety disorder, unspecified: Secondary | ICD-10-CM

## 2024-03-11 DIAGNOSIS — M249 Joint derangement, unspecified: Secondary | ICD-10-CM

## 2024-03-11 DIAGNOSIS — M359 Systemic involvement of connective tissue, unspecified: Secondary | ICD-10-CM

## 2024-03-11 DIAGNOSIS — I10 Essential (primary) hypertension: Secondary | ICD-10-CM

## 2024-03-11 DIAGNOSIS — Z01812 Encounter for preprocedural laboratory examination: Secondary | ICD-10-CM

## 2024-03-11 DIAGNOSIS — F32A Depression, unspecified: Secondary | ICD-10-CM

## 2024-03-12 LAB — CYTOLOGY - PAP
Adequacy: ABSENT
Comment: NEGATIVE
Diagnosis: NEGATIVE
High risk HPV: NEGATIVE

## 2024-03-14 ENCOUNTER — Ambulatory Visit: Payer: Self-pay | Admitting: Physician Assistant

## 2024-03-14 NOTE — Progress Notes (Signed)
 Please forward preop results to surgeon as requested.

## 2024-03-15 ENCOUNTER — Ambulatory Visit: Payer: Self-pay | Admitting: Obstetrics and Gynecology

## 2024-03-15 LAB — FACTOR 5 LEIDEN: Result: NEGATIVE

## 2024-03-15 LAB — APTT: aPTT: 30 s (ref 23–32)

## 2024-03-15 LAB — TSH: TSH: 1.74 m[IU]/L

## 2024-03-15 LAB — HEMOGLOBIN A1C
Hgb A1c MFr Bld: 5.1 % (ref <5.7)
Mean Plasma Glucose: 100 mg/dL
eAG (mmol/L): 5.5 mmol/L

## 2024-03-15 LAB — T3: T3, Total: 117 ng/dL (ref 76–181)

## 2024-03-15 LAB — PROTIME-INR
INR: 1
Prothrombin Time: 10.6 s (ref 9.0–11.5)

## 2024-03-15 LAB — T4: T4, Total: 10.2 ug/dL (ref 5.1–11.9)

## 2024-03-16 MED ORDER — METRONIDAZOLE 500 MG PO TABS
500.0000 mg | ORAL_TABLET | Freq: Two times a day (BID) | ORAL | 0 refills | Status: AC
Start: 2024-03-16 — End: ?

## 2024-03-24 ENCOUNTER — Encounter: Admitting: Obstetrics and Gynecology

## 2024-03-27 ENCOUNTER — Other Ambulatory Visit: Payer: Self-pay | Admitting: Rheumatology

## 2024-03-29 NOTE — Telephone Encounter (Signed)
 Last Fill: 12/30/2023  Eye exam: 03/08/2024    Labs: 02/16/2024 CBC and CMP are stable sed rate is mildly elevated at 25, complements normal, double-stranded DNA negative, urine protein creatinine ratio normal. Labs do not indicate an autoimmune disease flare.   Next Visit: 08/10/2024  Last Visit: 03/11/2024  IK:Jlunpfflwz disease   Current Dose per office note on 03/11/2024: Plaquenil  200 mg 1 tablet by mouth twice daily M-F.   Okay to refill Plaquenil ?

## 2024-03-31 ENCOUNTER — Encounter: Admitting: Obstetrics and Gynecology

## 2024-04-01 ENCOUNTER — Other Ambulatory Visit: Admitting: Obstetrics and Gynecology

## 2024-04-01 ENCOUNTER — Other Ambulatory Visit

## 2024-04-01 ENCOUNTER — Other Ambulatory Visit: Payer: Self-pay | Admitting: Obstetrics and Gynecology

## 2024-04-01 DIAGNOSIS — R9389 Abnormal findings on diagnostic imaging of other specified body structures: Secondary | ICD-10-CM

## 2024-04-01 DIAGNOSIS — N920 Excessive and frequent menstruation with regular cycle: Secondary | ICD-10-CM

## 2024-04-06 ENCOUNTER — Ambulatory Visit

## 2024-04-06 ENCOUNTER — Ambulatory Visit (INDEPENDENT_AMBULATORY_CARE_PROVIDER_SITE_OTHER): Admitting: Obstetrics and Gynecology

## 2024-04-06 DIAGNOSIS — N946 Dysmenorrhea, unspecified: Secondary | ICD-10-CM

## 2024-04-06 DIAGNOSIS — R9389 Abnormal findings on diagnostic imaging of other specified body structures: Secondary | ICD-10-CM | POA: Diagnosis not present

## 2024-04-06 DIAGNOSIS — N920 Excessive and frequent menstruation with regular cycle: Secondary | ICD-10-CM | POA: Diagnosis not present

## 2024-04-06 DIAGNOSIS — Z01812 Encounter for preprocedural laboratory examination: Secondary | ICD-10-CM

## 2024-04-06 LAB — PREGNANCY, URINE: Preg Test, Ur: NEGATIVE

## 2024-04-06 NOTE — Progress Notes (Unsigned)
 GYNECOLOGY  VISIT   HPI: 43 y.o.   Married  Caucasian female   G3P0 with No LMP recorded.   here for: Sonohysterogram & endometrial biopsy for thickened endometrium noted on outside pelvic ultrasound at [facility] on 12/25/23.  Endometrium measures 18 mm.    Patient endorses heavy and painful periods.  Last 2 periods were normal.  Prior periods were associated with irregular bleeding in between.         Hx C/S x 3.   Prior surgical consultation indicating that her uterus is attached to her abdominal wall, noted by surgeon when she had her appendix was removed 3/10/014. She states she has a picture at home of the surgical findings.    UPT today:  negative.   GYNECOLOGIC HISTORY: No LMP recorded. Contraception:  Vasectomy  Menopausal hormone therapy:  n/a Last 2 paps:  03/09/24 neg HR HPV neg, 11/27/20 ASCUS, HR HPV neg  History of abnormal Pap or positive HPV:  yes Mammogram:  12/25/23 [facility] Cat 1 neg (care everywhere)         OB History     Gravida  3   Para      Term      Preterm      AB      Living  3      SAB      IAB      Ectopic      Multiple      Live Births  3              Patient Active Problem List   Diagnosis Date Noted   Body dysmorphic disorder 09/02/2022   Generalized anxiety disorder with panic attacks 09/02/2022   MDD (major depressive disorder), recurrent, in full remission 09/02/2022   Insulin resistance 09/24/2021   Class 1 obesity with serious comorbidity and body mass index (BMI) of 30.0 to 30.9 in adult 09/24/2021   Benign essential hypertension 07/13/2021   Foraminal stenosis of cervical region 07/04/2020   Adverse effect of other viral vaccines, subsequent encounter 01/27/2020   Spinal stenosis of cervical region 12/28/2019   Fatigue 08/20/2019   Left shoulder pain 07/19/2014   RAYNAUD'S DISEASE 01/31/2007    Past Medical History:  Diagnosis Date   Allergy    seasonal   Anemia    Anxiety and depression    per patient  diagnosed by psychiatrist   Appendicitis 07/20/2012   Arthritis    Bilateral swelling of feet    resolved   Blood in stool    Chest pain    Chicken pox    Chronic fatigue syndrome    Connective tissue disease    Constipation    Depression    postpartum with one child   Essential hypertension 07/13/2021   history no longer taking medication   Fatty liver    GERD 01/31/2007   Qualifier: Diagnosis of  By: Wilhemina RMA, [name]     Hx of degenerative disc disease    Hyperlipidemia    postpartum   IBS (irritable bowel syndrome)    Iron deficiency anemia, unspecified    Osteoarthritis    Palpitations    PONV (postoperative nausea and vomiting)    Positive ANA (antinuclear antibody)    RAYNAUD'S DISEASE 01/31/2007   Qualifier: Diagnosis of  By: Wilhemina RMA, [name]     Seasonal allergies    Urticaria    Vitamin D  deficiency     Past Surgical History:  Procedure Laterality Date  ABDOMINOPLASTY  2015   mini   ANTERIOR FUSION CERVICAL SPINE  07/2020   CESAREAN SECTION     x3   CHOLECYSTECTOMY N/A 09/17/2022   Procedure: LAPAROSCOPIC CHOLECYSTECTOMY;  Surgeon: Signe Mitzie LABOR, MD;  Location: [facility];  Service: General;  Laterality: N/A;   COLONOSCOPY  01/2020   LAPAROSCOPIC APPENDECTOMY Right 07/20/2012   Procedure: APPENDECTOMY LAPAROSCOPIC;  Surgeon: Krystal CHRISTELLA Spinner, MD;  Location: [facility];  Service: General;  Laterality: Right;   TOOTH EXTRACTION  07/2023   WISDOM TOOTH EXTRACTION      Current Outpatient Medications  Medication Sig Dispense Refill   Calcium -Magnesium -Vitamin D  (CITRACAL SLOW RELEASE PO) Take 1 tablet by mouth daily.     esomeprazole  (NEXIUM ) 40 MG capsule TAKE 1 CAPSULE BY MOUTH ONCE A DAY (Patient taking differently: Take by mouth every morning.) 30 capsule 0   Ferrous Sulfate (IRON PO) Take 65 mg by mouth 3 (three) times a week.     hydroxychloroquine  (PLAQUENIL ) 200 MG tablet TAKE 1 TABLET BY MOUTH TWICE DAILY MONDAY THROUGH FRIDAY 120 tablet 0   hydrOXYzine   (ATARAX ) 50 MG tablet Take 1 tablet (50 mg total) by mouth daily as needed for anxiety. 30 tablet 2   levocetirizine (XYZAL ) 5 MG tablet 1 tablet in the evening Orally twice per day     metroNIDAZOLE  (FLAGYL ) 500 MG tablet Take 1 tablet (500 mg total) by mouth 2 (two) times daily. 14 tablet 0   rosuvastatin  (CRESTOR ) 10 MG tablet Take 1 tablet (10 mg total) by mouth daily. 90 tablet 1   sertraline  (ZOLOFT ) 100 MG tablet Take 2 tablets (200 mg total) by mouth daily. 180 tablet 1   valsartan  (DIOVAN ) 40 MG tablet Take 40 mg by mouth daily.     VITAMIN D  PO Take 2,000 Units by mouth daily.  (Patient taking differently: Take 3,000 Units by mouth daily.)     No current facility-administered medications for this visit.     ALLERGIES: Clarithromycin, Polyethylene glycol 2000 dimyristoyl glycerol, and Latex  Family History  Problem Relation Age of Onset   Hyperlipidemia Mother    Early menopause Mother    Depression Mother    Anxiety disorder Mother    Other Father        no contact since infant   Alcoholism Father    Drug abuse Father    Raynaud syndrome Sister    Anxiety disorder Sister    Depression Sister    Immunodeficiency Maternal Aunt    Heart attack Maternal Aunt 21   CAD Maternal Aunt    Sleep apnea Maternal Aunt    Lupus Maternal Uncle    Leukemia Maternal Uncle    Cancer Maternal Grandmother        melanoma   Stroke Maternal Grandmother 38   Arthritis Maternal Grandmother    Hyperlipidemia Maternal Grandmother    Hypertension Maternal Grandmother    Breast cancer Maternal Grandmother 72   Cancer Maternal Grandfather        bladder   Bladder Cancer Maternal Grandfather    Autism Son    Healthy Son    Healthy Son    Multiple sclerosis Cousin    Sleep apnea Niece    Lung cancer Other        all but one of grandmother's siblings    Friedreich's ataxia Niece/Nephew    Colon cancer Neg Hx    Esophageal cancer Neg Hx     Social History   Socioeconomic History  Marital status: Married    Spouse name: Not on file   Number of children: 3   Years of education: Not on file   Highest education level: Bachelor's degree (e.g., BA, AB, BS)  Occupational History   Occupation: Stay at home spouse   Occupation: firefighter  Tobacco Use   Smoking status: Never    Passive exposure: Past   Smokeless tobacco: Never  Vaping Use   Vaping status: Never Used  Substance and Sexual Activity   Alcohol use: No   Drug use: No   Sexual activity: Not Currently    Comment: husband had vasectomy  Other Topics Concern   Not on file  Social History Narrative   Work or School: homemaker, 3 kids - 9,7 and 3 (2014)      Home Situation: lives with husband and children      Spiritual Beliefs: none      Lifestyle: hot yoga (used to), weights and cardio, tries to eat a healthy diet      Lives in a 2 story home      Right handed      Caffeine: 24-36 oz pepsi zero per day    Social Drivers of Health   Financial Resource Strain: Low Risk  (07/12/2021)   Overall Financial Resource Strain (CARDIA)    Difficulty of Paying Living Expenses: Not hard at all  Food Insecurity: Low Risk  (07/14/2023)   Received from [facility]   Hunger Vital Sign    Within the past 12 months, you worried that your food would run out before you got money to buy more: Never true    Within the past 12 months, the food you bought just didn't last and you didn't have money to get more. : Never true  Transportation Needs: No Transportation Needs (07/14/2023)   Received from [employer]    In the past 12 months, has lack of reliable transportation kept you from medical appointments, meetings, work or from getting things needed for daily living? : No  Physical Activity: Inactive (11/12/2022)   Exercise Vital Sign    Days of Exercise per Week: 0 days    Minutes of Exercise per Session: 0 min  Stress: No Stress Concern Present (07/12/2021)   [employer]ational  [employer] - Occupational Stress Questionnaire    Feeling of Stress : Only a little  Social Connections: Unknown (04/09/2022)   Received from [facility]   Social Network    Social Network: Not on file  Intimate Partner Violence: Not At Risk (11/12/2022)   Humiliation, Afraid, Rape, and Kick questionnaire    Fear of Current or Ex-Partner: No    Emotionally Abused: No    Physically Abused: No    Sexually Abused: No    Review of Systems  See HPI.   PHYSICAL EXAMINATION:   There were no vitals taken for this visit.    General appearance: alert, cooperative and appears stated age  Pelvic: External genitalia:  no lesions              Urethra:  normal appearing urethra with no masses, tenderness or lesions              Bartholins and Skenes: normal                 Vagina: normal appearing vagina with normal color and discharge, no lesions              Cervix:  flush with vaginal apex.  No palpable cervix on bimanual exam.                 Bimanual Exam:  Uterus:  normal size, contour, position, consistency, mobility, non-tender              Adnexa: no mass, fullness, tenderness       Pelvic ultrasound:  transabdominal and transvaginal Uterus  7.95 x 5.28 x 4.26 cm.  No myometrial masses.  EMS 4.34 mm Filling defect:  19 x 10 mm.  Feeder vessel noted. Fluid around the filling defect.  Left ovary  3.79 x 1.88 x 1.90 cm. Multiple follicles noted 15.9 mm, 16.7 mm, 9.1 mm, 10.0 mm. Right ovary 1.93 x 1.10 x 1.39 cm.  10.3 mm follicle. No adnexal masses.  No free fluid.   Attempt at sonohysterogram and endometrial biopsy not performed. Consent and time out were done.    Speculum placed in vagina and cervix cleansed with Hibiclens .  Tenaculum will not apply to the anterior or posterior cervical lip due to the cervix being flush with the vagina.    Chaperone was present for exam:  Kari HERO, CMA  ASSESSMENT:  Menorrhagia with irregular menses.  Thickened endometrium with endometrial mass.   Dysmenorrhea.  Cervix flush with vaginal apex.  Hx C/S x 3.  Reported hx of adhesions between the uterus and the anterior abdominal wall.   PLAN:  Sonohysterogram aborted today.  No EMB was possible.  We reviewed her US  images and report.  We discuss ultrasound guided hysteroscopy with dilation and curettage.  Written information from [facility] given.  Will maker surgical consultation referral.  Patient will bring pictures with her from her appendectomy.    ***  total time was spent for this patient encounter, including preparation, face-to-face counseling with the patient, coordination of care, and documentation of the encounter.

## 2024-04-07 ENCOUNTER — Encounter: Payer: Self-pay | Admitting: Obstetrics and Gynecology

## 2024-04-17 ENCOUNTER — Encounter: Payer: Self-pay | Admitting: Obstetrics and Gynecology

## 2024-04-19 ENCOUNTER — Ambulatory Visit (HOSPITAL_COMMUNITY): Admitting: Student in an Organized Health Care Education/Training Program

## 2024-04-26 ENCOUNTER — Ambulatory Visit: Admitting: Obstetrics and Gynecology

## 2024-04-29 ENCOUNTER — Encounter: Payer: Self-pay | Admitting: Obstetrics and Gynecology

## 2024-04-29 ENCOUNTER — Ambulatory Visit: Admitting: Obstetrics and Gynecology

## 2024-04-29 VITALS — BP 138/72 | HR 78 | Resp 14

## 2024-04-29 DIAGNOSIS — N3289 Other specified disorders of bladder: Secondary | ICD-10-CM | POA: Diagnosis not present

## 2024-04-29 DIAGNOSIS — N920 Excessive and frequent menstruation with regular cycle: Secondary | ICD-10-CM

## 2024-04-29 DIAGNOSIS — N8003 Adenomyosis of the uterus: Secondary | ICD-10-CM

## 2024-04-29 DIAGNOSIS — R9389 Abnormal findings on diagnostic imaging of other specified body structures: Secondary | ICD-10-CM

## 2024-04-29 DIAGNOSIS — D500 Iron deficiency anemia secondary to blood loss (chronic): Secondary | ICD-10-CM | POA: Diagnosis not present

## 2024-04-29 DIAGNOSIS — N946 Dysmenorrhea, unspecified: Secondary | ICD-10-CM | POA: Diagnosis not present

## 2024-04-29 NOTE — Progress Notes (Signed)
 GYNECOLOGY  VISIT   Surgical consult from Dr. Nikki  HPI: Per Dr. Cyrilla note  43 y.o.   Married  Caucasian female   G3P0 with Patient's last menstrual period was 04/27/2024.   here for: Sonohysterogram & endometrial biopsy for thickened endometrium noted on outside pelvic ultrasound at [facility] on 12/25/23.  Endometrium measured 18 mm.    Patient endorses heavy and painful periods.  Last 2 periods were normal.  Prior periods were associated with irregular bleeding in between.         Hx C/S x 3.   Prior surgical consultation indicating that her uterus is attached to her abdominal wall, noted by surgeon when she had her appendix was removed 3/10/014. She states she has a picture at home of the surgical findings.    UPT today:  negative.   Today, patient reports very painful periods that last about 7 days and she can use a tampon an hour and has anemia.  Pain radiates in her back and lower pelvis. Picture from appendectomy with adhesions from uterus to anterior abdominal wall. She is frustrated with the bleeding and pain and would like to move forward with a definitive treatment with the [facility].  GYNECOLOGIC HISTORY: Patient's last menstrual period was 04/27/2024. Contraception:  Vasectomy  Menopausal hormone therapy:  n/a Last 2 paps:  03/09/24 neg HR HPV neg, 11/27/20 ASCUS, HR HPV neg  History of abnormal Pap or positive HPV:  yes Mammogram:  12/25/23 BIRADS Cat 1 neg (care everywhere)         OB History     Gravida  3   Para      Term      Preterm      AB      Living  3      SAB      IAB      Ectopic      Multiple      Live Births  3              Patient Active Problem List   Diagnosis Date Noted   Body dysmorphic disorder 09/02/2022   Generalized anxiety disorder with panic attacks 09/02/2022   MDD (major depressive disorder), recurrent, in full remission 09/02/2022   Insulin resistance 09/24/2021   Class 1 obesity with serious comorbidity and  body mass index (BMI) of 30.0 to 30.9 in adult 09/24/2021   Benign essential hypertension 07/13/2021   Foraminal stenosis of cervical region 07/04/2020   Adverse effect of other viral vaccines, subsequent encounter 01/27/2020   Spinal stenosis of cervical region 12/28/2019   Fatigue 08/20/2019   Left shoulder pain 07/19/2014   RAYNAUD'S DISEASE 01/31/2007    Past Medical History:  Diagnosis Date   Allergy    seasonal   Anemia    Anxiety and depression    per patient diagnosed by psychiatrist   Appendicitis 07/20/2012   Arthritis    Bilateral swelling of feet    resolved   Blood in stool    Chest pain    Chicken pox    Chronic fatigue syndrome    Connective tissue disease    Constipation    Depression    postpartum with one child   Essential hypertension 07/13/2021   history no longer taking medication   Fatty liver    GERD 01/31/2007   Qualifier: Diagnosis of  By: Wilhemina RMA, [name]     Hx of degenerative disc disease    Hyperlipidemia  postpartum   IBS (irritable bowel syndrome)    Iron deficiency anemia, unspecified    Osteoarthritis    Palpitations    PONV (postoperative nausea and vomiting)    Positive ANA (antinuclear antibody)    RAYNAUD'S DISEASE 01/31/2007   Qualifier: Diagnosis of  By: Wilhemina RMA, [name]     Seasonal allergies    Urticaria    Vitamin D  deficiency     Past Surgical History:  Procedure Laterality Date   ABDOMINOPLASTY  2015   mini   ANTERIOR FUSION CERVICAL SPINE  07/2020   CESAREAN SECTION     x3   CHOLECYSTECTOMY N/A 09/17/2022   Procedure: LAPAROSCOPIC CHOLECYSTECTOMY;  Surgeon: Signe Mitzie LABOR, MD;  Location: [facility];  Service: General;  Laterality: N/A;   COLONOSCOPY  01/2020   LAPAROSCOPIC APPENDECTOMY Right 07/20/2012   Procedure: APPENDECTOMY LAPAROSCOPIC;  Surgeon: Krystal CHRISTELLA Spinner, MD;  Location: [facility];  Service: General;  Laterality: Right;   TOOTH EXTRACTION  07/2023   WISDOM TOOTH EXTRACTION      Current Outpatient  Medications  Medication Sig Dispense Refill   Calcium -Magnesium -Vitamin D  (CITRACAL SLOW RELEASE PO) Take 1 tablet by mouth daily.     esomeprazole  (NEXIUM ) 40 MG capsule TAKE 1 CAPSULE BY MOUTH ONCE A DAY 30 capsule 0   Ferrous Sulfate (IRON PO) Take 65 mg by mouth 3 (three) times a week.     hydroxychloroquine  (PLAQUENIL ) 200 MG tablet TAKE 1 TABLET BY MOUTH TWICE DAILY MONDAY THROUGH FRIDAY 120 tablet 0   hydrOXYzine  (ATARAX ) 50 MG tablet Take 1 tablet (50 mg total) by mouth daily as needed for anxiety. 30 tablet 2   levocetirizine (XYZAL ) 5 MG tablet 1 tablet in the evening Orally twice per day     rosuvastatin  (CRESTOR ) 10 MG tablet Take 1 tablet (10 mg total) by mouth daily. 90 tablet 1   sertraline  (ZOLOFT ) 100 MG tablet Take 2 tablets (200 mg total) by mouth daily. 180 tablet 1   valsartan  (DIOVAN ) 40 MG tablet Take 40 mg by mouth daily.     VITAMIN D  PO Take 2,000 Units by mouth daily.      No current facility-administered medications for this visit.     ALLERGIES: Clarithromycin, Polyethylene glycol 2000 dimyristoyl glycerol, and Latex  Family History  Problem Relation Age of Onset   Hyperlipidemia Mother    Early menopause Mother    Depression Mother    Anxiety disorder Mother    Other Father        no contact since infant   Alcoholism Father    Drug abuse Father    Raynaud syndrome Sister    Anxiety disorder Sister    Depression Sister    Immunodeficiency Maternal Aunt    Heart attack Maternal Aunt 41   CAD Maternal Aunt    Sleep apnea Maternal Aunt    Lupus Maternal Uncle    Leukemia Maternal Uncle    Cancer Maternal Grandmother        melanoma   Stroke Maternal Grandmother 57   Arthritis Maternal Grandmother    Hyperlipidemia Maternal Grandmother    Hypertension Maternal Grandmother    Breast cancer Maternal Grandmother 72   Cancer Maternal Grandfather        bladder   Bladder Cancer Maternal Grandfather    Autism Son    Healthy Son    Healthy Son     Multiple sclerosis Cousin    Sleep apnea Niece    Lung cancer Other  all but one of grandmother's siblings    Friedreich's ataxia Niece/Nephew    Colon cancer Neg Hx    Esophageal cancer Neg Hx     Social History   Socioeconomic History   Marital status: Married    Spouse name: Not on file   Number of children: 3   Years of education: Not on file   Highest education level: Bachelor's degree (e.g., BA, AB, BS)  Occupational History   Occupation: Stay at home spouse   Occupation: firefighter  Tobacco Use   Smoking status: Never    Passive exposure: Past   Smokeless tobacco: Never  Vaping Use   Vaping status: Never Used  Substance and Sexual Activity   Alcohol use: No   Drug use: No   Sexual activity: Not Currently    Comment: husband had vasectomy  Other Topics Concern   Not on file  Social History Narrative   Work or School: homemaker, 3 kids - 9,7 and 3 (2014)      Home Situation: lives with husband and children      Spiritual Beliefs: none      Lifestyle: hot yoga (used to), weights and cardio, tries to eat a healthy diet      Lives in a 2 story home      Right handed      Caffeine: 24-36 oz pepsi zero per day    Social Drivers of Health   Tobacco Use: Low Risk (04/29/2024)   Patient History    Smoking Tobacco Use: Never    Smokeless Tobacco Use: Never    Passive Exposure: Past  Financial Resource Strain: Low Risk (07/12/2021)   Overall Financial Resource Strain (CARDIA)    Difficulty of Paying Living Expenses: Not hard at all  Food Insecurity: Low Risk (07/14/2023)   Received from [facility]   Epic    Within the past 12 months, you worried that your food would run out before you got money to buy more: Never true    Within the past 12 months, the food you bought just didn't last and you didn't have money to get more. : Never true  Transportation Needs: No Transportation Needs (07/14/2023)   Received from [facility]    In the  past 12 months, has lack of reliable transportation kept you from medical appointments, meetings, work or from getting things needed for daily living? : No  Physical Activity: Inactive (11/12/2022)   Exercise Vital Sign    Days of Exercise per Week: 0 days    Minutes of Exercise per Session: 0 min  Stress: No Stress Concern Present (07/12/2021)   [employer] of [employer]ational [employer] - Occupational Stress Questionnaire    Feeling of Stress : Only a little  Social Connections: Unknown (04/09/2022)   [facility]   Social Network    Social Network: Not on file  Intimate Partner Violence: Not At Risk (11/12/2022)   Humiliation, Afraid, Rape, and Kick questionnaire    Fear of Current or Ex-Partner: No    Emotionally Abused: No    Physically Abused: No    Sexually Abused: No  Depression (PHQ2-9): Low Risk (02/24/2024)   Depression (PHQ2-9)    PHQ-2 Score: 0  Alcohol Screen: Low Risk (11/12/2022)   Alcohol Screen    Last Alcohol Screening Score (AUDIT): 0  Housing: Low Risk (07/14/2023)   Received from [facility]   Epic    What is your living situation today?: I  have a steady place to live    Think about the place you live. Do you have problems with any of the following? Choose all that apply:: None/None on this list  Utilities: Low Risk (07/14/2023)   Received from [facility]   Utilities    In the past 12 months has the electric, gas, oil, or water company threatened to shut off services in your home? : No  Health Literacy: Not on file    Review of Systems  See HPI.   PHYSICAL EXAMINATION:   BP 138/72   Pulse 78   Resp 14   LMP 04/27/2024     General appearance: alert, cooperative and appears stated age  Pelvic: External genitalia:  no lesions              Urethra:  normal appearing urethra with no masses, tenderness or lesions              Bartholins and Skenes: normal                 Vagina: normal appearing vagina with normal color and discharge, no lesions               Cervix: flush with vaginal apex.  No palpable cervix on bimanual exam.                 Bimanual Exam:  Uterus:  normal size, contour, position, consistency, mobility, non-tender              Adnexa: no mass, fullness, tenderness       Pelvic ultrasound:  transabdominal and transvaginal Uterus  7.95 x 5.28 x 4.26 cm.  No myometrial masses.  EMS 4.34 mm Filling defect:  19 x 10 mm.  Feeder vessel noted. Fluid around the filling defect.  Left ovary  3.79 x 1.88 x 1.90 cm. Multiple follicles noted 15.9 mm, 16.7 mm, 9.1 mm, 10.0 mm. Right ovary 1.93 x 1.10 x 1.39 cm.  10.3 mm follicle. No adnexal masses.  No free fluid.   Attempt at sonohysterogram and endometrial biopsy - ultimately not performed. Consent and time out were done.    Speculum placed in vagina and cervix cleansed with Hibiclens .  Tenaculum will not apply to the anterior or posterior cervical lip due to the cervix being flush with the vagina.   Procedures aborted.   Chaperone was present for exam:  Kari HERO, CMA  ASSESSMENT:  Menorrhagia with regular menses.  Thickened endometrium with endometrial mass.  Dysmenorrhea.  Cervix flush with vaginal apex.  Hx C/S x 3.  Reported hx of adhesions between the uterus and the anterior abdominal wall.   PLAN:  Sonohysterogram aborted No EMB was possible.  H/o LTCS x3 with dense lower uterine segment adhesions Menorrhagia Dysmenorrhea Anemia Likely adenomyosis   Risk of perforation with EMB and unlikely to obtain due to the adhesions and with ongoing risk in the future without being able to sample. She wants to move forward with the [facility] and discussed risk for additional surgery with gynonc if surgery is found on pathology without an EMB sample. Discussed risk fo bladder injury as well and need for foley catheter for healing in the postop period, if injured at the time of surgery.  Discussed even with removal of polyp, may still have adenomyosis as the cause of the  pain and bleeding and this would/could continue even with removal and considering adenomyosis does not always respond to hormonal treatment. Counseled on all options.  She would like to have the [facility].  Counseled extensively on the procedure including but not limited to what to expect and risks and benefits.  Counseled on postop care and pelvic rest for 10 weeks after the surgery with restricted lifting for 6 weeks after.  Counseled on the benefits of the robotic procedure with faster return to daily activities, improved outcomes, and less risk for complications. She would like to have this scheduled.  20 min  total time was spent for this patient encounter, including preparation, face-to-face counseling with the patient, coordination of care, and documentation of the encounter.

## 2024-05-26 ENCOUNTER — Ambulatory Visit (HOSPITAL_COMMUNITY): Admitting: Student in an Organized Health Care Education/Training Program

## 2024-05-26 ENCOUNTER — Encounter (HOSPITAL_COMMUNITY): Payer: Self-pay | Admitting: Student in an Organized Health Care Education/Training Program

## 2024-05-26 VITALS — BP 121/76 | HR 81 | Ht 63.0 in | Wt 167.0 lb

## 2024-05-26 DIAGNOSIS — F4522 Body dysmorphic disorder: Secondary | ICD-10-CM | POA: Diagnosis not present

## 2024-05-26 DIAGNOSIS — F411 Generalized anxiety disorder: Secondary | ICD-10-CM | POA: Diagnosis not present

## 2024-05-26 DIAGNOSIS — F3342 Major depressive disorder, recurrent, in full remission: Secondary | ICD-10-CM

## 2024-05-26 DIAGNOSIS — F410 Panic disorder [episodic paroxysmal anxiety] without agoraphobia: Secondary | ICD-10-CM | POA: Diagnosis not present

## 2024-05-26 NOTE — Progress Notes (Signed)
 BH MD Outpatient Progress Note  05/26/2024 7:42 AM Lindsay Wong  MRN:  [id number]  Assessment:  Lindsay Wong presents for follow-up evaluation on  05/26/2024 .   Overall, presentation is most consistent with anxiety disorder and body dysmorphic symptoms, currently stable since the last medication adjustment. At todays appointment, the patient reports improvement and stabilization of anxiety and body dysmorphia since titrating Zoloft , with no new or worsening symptoms. No acute safety concerns are identified, and there are no objective findings that alter formulation.  Zoloft  is appropriate to continue at the current dose, as it appears effective and well tolerated. No medication changes are indicated at this time, and no prescriptions were sent given adequate refills per dispense history.   Identifying Information: Lindsay Wong is a 44 y.o. female with a history of body dysmorphic disorder, anxiety, MDD, autoimmune disease, Raynaud's disease, degenerative disc disease, HTN, and Vitamin D  deficiency who is an established patient with [facility] for management of anxiety and body dysmorphia.   Plan:  # GAD with panic attacks  Body dysmorphic disorder # MDD currently in remission Past medication trials: Celexa  40 mg (effective but still experiencing symptoms); Prozac (N/V; nervous); Zoloft  50 mg (2016); Lexapro, Paxil (didn't do well); hydroxyzine ; propranolol ; gabapentin  (swelling); Xanax 0.25 mg x 7 days Status of problem:  stabalized Interventions: --Continue Zoloft  200 mg mg daily (s4/22/24, i6/17/24, i1/27/25, i10/27/25) -- Continue hydroxyzine  50 mg daily PRN anxiety/sleep (using infrequently)  # Sleep disruption Past medication trials: melatonin Status of problem: stable Interventions: -- Atarax  PRN as above   Patient was given contact information for behavioral health clinic and was instructed to call 911 for emergencies.   Subjective:  Chief  Complaint:  Chief Complaint  Patient presents with   Follow-up    Interval History:  Patient reports that since the last visit she has been holding steady overall and has found the increased dose of sertraline  helpful for body dysmorphia, noting she has not been as fixated and reports no compensatory behaviors. She describes her mood as improved and states depressive symptoms are better. Patient reports that anxiety symptoms have also improved and states she uses hydroxyzine  approximately once every other week as needed. She describes limited enjoyment of therapy overall but reports she does like and finds benefit in the homework assignments. Patient reports full medication compliance and states she has been taking medications as directed without missed doses, and she denies any adverse effects. She describes chronic difficulty with sleep initiation but reports sleeping seven to eight hours per night and states she feels rested in the morning. Patient reports good appetite and states her energy is adequate. She describes daily caffeine intake of approximately four cups of coffee. Patient reports no recent use of alcohol, tobacco, or cannabis. She describes no suicidal ideation, denies homicidal ideation, and reports no auditory or visual hallucinations.     Visit Diagnosis:  No diagnosis found.   Past Psychiatric History:  Diagnoses: GAD, MDD, body dysmorphia Medication trials: Celexa  40 mg (effective but still experiencing symptoms); Prozac (N/V; nervous); Zoloft  50 mg (2016); Lexapro, Paxil (didn't do well); Vyvanse ; hydroxyzine ; gabapentin  (swelling) Previous psychiatrist/therapist: last seen >3-4 years ago by psychiatrist  Hospitalizations: denies Suicide attempts: attempted hanging 4 years ago but found by husband SIB: denies outside of skin picking when anxious Hx of violence towards others: denies Current access to guns: denies Hx of trauma/abuse: denies (may have been exposed to  abuse when younger but cannot remember) Substance use:    --  Etoh: denies             -- Tobacco: denies              -- Denies use of cannabis, unprescribed benzos, opioids, stimulants  Past Medical History:  Past Medical History:  Diagnosis Date   Allergy    seasonal   Anemia    Anxiety and depression    per patient diagnosed by psychiatrist   Appendicitis 07/20/2012   Arthritis    Bilateral swelling of feet    resolved   Blood in stool    Chest pain    Chicken pox    Chronic fatigue syndrome    Connective tissue disease    Constipation    Depression    postpartum with one child   Essential hypertension 07/13/2021   history no longer taking medication   Fatty liver    GERD 01/31/2007   Qualifier: Diagnosis of  By: Wilhemina RMA, [name]     Hx of degenerative disc disease    Hyperlipidemia    postpartum   IBS (irritable bowel syndrome)    Iron deficiency anemia, unspecified    Osteoarthritis    Palpitations    PONV (postoperative nausea and vomiting)    Positive ANA (antinuclear antibody)    RAYNAUD'S DISEASE 01/31/2007   Qualifier: Diagnosis of  By: Wilhemina RMA, [name]     Seasonal allergies    Urticaria    Vitamin D  deficiency     Past Surgical History:  Procedure Laterality Date   ABDOMINOPLASTY  2015   mini   ANTERIOR FUSION CERVICAL SPINE  07/2020   CESAREAN SECTION     x3   CHOLECYSTECTOMY N/A 09/17/2022   Procedure: LAPAROSCOPIC CHOLECYSTECTOMY;  Surgeon: Signe Mitzie LABOR, MD;  Location: [facility];  Service: General;  Laterality: N/A;   COLONOSCOPY  01/2020   LAPAROSCOPIC APPENDECTOMY Right 07/20/2012   Procedure: APPENDECTOMY LAPAROSCOPIC;  Surgeon: Krystal CHRISTELLA Spinner, MD;  Location: [facility];  Service: General;  Laterality: Right;   TOOTH EXTRACTION  07/2023   WISDOM TOOTH EXTRACTION      Family Psychiatric History:  -- Mom: anxiety -- Dad: alcohol use disorder, opioid use disorder -- Son: autism spectrum disorder -- Sister: anxiety, depression -- Maternal  cousin 1: OCD -- Maternal cousin 2/3: schizophrenia Denies family history of death by suicide.   Family History:  Family History  Problem Relation Age of Onset   Hyperlipidemia Mother    Early menopause Mother    Depression Mother    Anxiety disorder Mother    Other Father        no contact since infant   Alcoholism Father    Drug abuse Father    Raynaud syndrome Sister    Anxiety disorder Sister    Depression Sister    Immunodeficiency Maternal Aunt    Heart attack Maternal Aunt 78   CAD Maternal Aunt    Sleep apnea Maternal Aunt    Lupus Maternal Uncle    Leukemia Maternal Uncle    Cancer Maternal Grandmother        melanoma   Stroke Maternal Grandmother 47   Arthritis Maternal Grandmother    Hyperlipidemia Maternal Grandmother    Hypertension Maternal Grandmother    Breast cancer Maternal Grandmother 72   Cancer Maternal Grandfather        bladder   Bladder Cancer Maternal Grandfather    Autism Son    Healthy Son    Healthy Son    Multiple  sclerosis Cousin    Sleep apnea Niece    Lung cancer Other        all but one of grandmother's siblings    Friedreich's ataxia Niece/Nephew    Colon cancer Neg Hx    Esophageal cancer Neg Hx     Social History:  Social History   Socioeconomic History   Marital status: Married    Spouse name: Not on file   Number of children: 3   Years of education: Not on file   Highest education level: Bachelor's degree (e.g., BA, AB, BS)  Occupational History   Occupation: Stay at home spouse   Occupation: firefighter  Tobacco Use   Smoking status: Never    Passive exposure: Past   Smokeless tobacco: Never  Vaping Use   Vaping status: Never Used  Substance and Sexual Activity   Alcohol use: No   Drug use: No   Sexual activity: Not Currently    Comment: husband had vasectomy  Other Topics Concern   Not on file  Social History Narrative   Work or School: homemaker, 3 kids - 9,7 and 3 (2014)      Home Situation: lives  with husband and children      Spiritual Beliefs: none      Lifestyle: hot yoga (used to), weights and cardio, tries to eat a healthy diet      Lives in a 2 story home      Right handed      Caffeine: 24-36 oz pepsi zero per day    Social Drivers of Health   Tobacco Use: Low Risk (04/29/2024)   Patient History    Smoking Tobacco Use: Never    Smokeless Tobacco Use: Never    Passive Exposure: Past  Financial Resource Strain: Low Risk (07/12/2021)   Overall Financial Resource Strain (CARDIA)    Difficulty of Paying Living Expenses: Not hard at all  Food Insecurity: Low Risk (07/14/2023)   Received from [facility]   Epic    Within the past 12 months, you worried that your food would run out before you got money to buy more: Never true    Within the past 12 months, the food you bought just didn't last and you didn't have money to get more. : Never true  Transportation Needs: No Transportation Needs (07/14/2023)   Received from [facility]    In the past 12 months, has lack of reliable transportation kept you from medical appointments, meetings, work or from getting things needed for daily living? : No  Physical Activity: Inactive (11/12/2022)   Exercise Vital Sign    Days of Exercise per Week: 0 days    Minutes of Exercise per Session: 0 min  Stress: No Stress Concern Present (07/12/2021)   [employer] of Occupational [employer] - Occupational Stress Questionnaire    Feeling of Stress : Only a little  Social Connections: Unknown (04/09/2022)   Received from [facility]   Social Network    Social Network: Not on file  Depression [phone]): Low Risk (02/24/2024)   Depression (PHQ2-9)    PHQ-2 Score: 0  Alcohol Screen: Low Risk (11/12/2022)   Alcohol Screen    Last Alcohol Screening Score (AUDIT): 0  Housing: Low Risk (07/14/2023)   Received from [facility]   Epic    What is your living situation today?: I have a steady place to live    Think about the place  you live. Do you have  problems with any of the following? Choose all that apply:: None/None on this list  Utilities: Low Risk (07/14/2023)   Received from [facility]   Utilities    In the past 12 months has the electric, gas, oil, or water company threatened to shut off services in your home? : No  Health Literacy: Not on file    Allergies:  Allergies  Allergen Reactions   Clarithromycin Shortness Of Breath    REACTION: hives   Polyethylene Glycol 2000 Dimyristoyl Glycerol Anaphylaxis   Latex     Skin irritation from gloves    Current Medications: Current Outpatient Medications  Medication Sig Dispense Refill   Calcium -Magnesium -Vitamin D  (CITRACAL SLOW RELEASE PO) Take 1 tablet by mouth daily.     esomeprazole  (NEXIUM ) 40 MG capsule TAKE 1 CAPSULE BY MOUTH ONCE A DAY 30 capsule 0   Ferrous Sulfate (IRON PO) Take 65 mg by mouth 3 (three) times a week.     hydroxychloroquine  (PLAQUENIL ) 200 MG tablet TAKE 1 TABLET BY MOUTH TWICE DAILY MONDAY THROUGH FRIDAY 120 tablet 0   hydrOXYzine  (ATARAX ) 50 MG tablet Take 1 tablet (50 mg total) by mouth daily as needed for anxiety. 30 tablet 2   levocetirizine (XYZAL ) 5 MG tablet 1 tablet in the evening Orally twice per day     rosuvastatin  (CRESTOR ) 10 MG tablet Take 1 tablet (10 mg total) by mouth daily. 90 tablet 1   sertraline  (ZOLOFT ) 100 MG tablet Take 2 tablets (200 mg total) by mouth daily. 180 tablet 1   valsartan  (DIOVAN ) 40 MG tablet Take 40 mg by mouth daily.     VITAMIN D  PO Take 2,000 Units by mouth daily.      No current facility-administered medications for this visit.    ROS: See above  Objective:  Psychiatric Specialty Exam: Last menstrual period 04/27/2024.There is no height or weight on file to calculate BMI.  General Appearance: Casual and Fairly Groomed  Eye Contact:  Good  Speech:  Clear and Coherent and Normal Rate  Volume:  Normal  Mood:  good   Affect:  Euthymic; calm  Thought Content: Does not endorse  AVH   Suicidal Thoughts: Denies  Homicidal Thoughts:  No  Thought Process:  Goal Directed and Linear  Orientation:  Full (Time, Place, and Person)    Memory:  Grossly intact  Judgment:  Good  Insight:  Good  Concentration:  Concentration: Good  Recall:  not formally assessed  Fund of Knowledge: Good  Language: Good  Psychomotor Activity:  Normal  Akathisia:  No  AIMS (if indicated): not done  Assets:  Communication Skills Desire for Improvement Housing Intimacy Leisure Time Resilience Social Support Talents/Skills Transportation  ADL's:  Intact  Cognition: WNL  Sleep:  Good   PE: General: no acute distress  Pulm: no increased work of breathing on room air  MSK: all extremity movements appear intact  Neuro: no focal neurological deficits observed    Metabolic Disorder Labs: Lab Results  Component Value Date   HGBA1C 5.1 03/11/2024   MPG 100 03/11/2024   MPG 103 04/17/2020   No results found for: PROLACTIN Lab Results  Component Value Date   CHOL 247 (H) 08/14/2021   TRIG 145 08/14/2021   HDL 61 08/14/2021   CHOLHDL 3 11/29/2020   VLDL 17.8 11/29/2020   LDLCALC 160 (H) 08/14/2021   LDLCALC 152 (H) 11/29/2020   Lab Results  Component Value Date   TSH 1.74 03/11/2024   TSH 2.50 07/10/2021  Therapeutic Level Labs: No results found for: LITHIUM No results found for: VALPROATE No results found for: CBMZ  Screenings: GAD-7    Flowsheet Row Counselor from 11/12/2022 in [facility] Office Visit from 03/01/2019 in [facility] [place]  Total GAD-7 Score 11 15   PHQ2-9    Flowsheet Row Office Visit from 02/24/2024 in [facility] [place] Counselor from 11/12/2022 in [facility] Office Visit from 08/14/2021 in [facility] Visit from 08/06/2021 in [facility] [place] Office Visit  from 07/13/2021 in [facility] [place]  PHQ-2 Total Score 0 3 5 0 0  PHQ-9 Total Score -- 13 13 1  --   Flowsheet Row Counselor from 11/12/2022 in [facility] Admission (Discharged) from 09/17/2022 in [place] [facility] ED from 08/25/2022 in [facility]  C-SSRS RISK CATEGORY Low Risk No Risk No Risk    Collaboration of Care: Collaboration of Care: Medication Management AEB active medication management and Psychiatrist AEB established with this provider  Patient/Guardian was advised Release of Information must be obtained prior to any record release in order to collaborate their care with an outside provider. Patient/Guardian was advised if they have not already done so to contact the registration department to sign all necessary forms in order for us  to release information regarding their care.   Consent: Patient/Guardian gives verbal consent for treatment and assignment of benefits for services provided during this visit. Patient/Guardian expressed understanding and agreed to proceed.    I provided 30 minutes dedicated to the care of this patient via video on the date of this encounter to include chart review, face-to-face time with the patient, medication management/counseling, documentation, brief supportive psychotherapy.  Marlo Masson, MD 05/26/2024, 7:42 AM

## 2024-05-26 NOTE — H&P (View-Only) (Signed)
 BH MD Outpatient Progress Note  05/26/2024 7:42 AM ALSACE DOWD  MRN:  [id number]  Assessment:  Lindsay Wong presents for follow-up evaluation on  05/26/2024 .   Overall, presentation is most consistent with anxiety disorder and body dysmorphic symptoms, currently stable since the last medication adjustment. At todays appointment, the patient reports improvement and stabilization of anxiety and body dysmorphia since titrating Zoloft , with no new or worsening symptoms. No acute safety concerns are identified, and there are no objective findings that alter formulation.  Zoloft  is appropriate to continue at the current dose, as it appears effective and well tolerated. No medication changes are indicated at this time, and no prescriptions were sent given adequate refills per dispense history.   Identifying Information: Lindsay Wong is a 44 y.o. female with a history of body dysmorphic disorder, anxiety, MDD, autoimmune disease, Raynaud's disease, degenerative disc disease, HTN, and Vitamin D  deficiency who is an established patient with [facility] for management of anxiety and body dysmorphia.   Plan:  # GAD with panic attacks  Body dysmorphic disorder # MDD currently in remission Past medication trials: Celexa  40 mg (effective but still experiencing symptoms); Prozac (N/V; nervous); Zoloft  50 mg (2016); Lexapro, Paxil (didn't do well); hydroxyzine ; propranolol ; gabapentin  (swelling); Xanax 0.25 mg x 7 days Status of problem:  stabalized Interventions: --Continue Zoloft  200 mg mg daily (s4/22/24, i6/17/24, i1/27/25, i10/27/25) -- Continue hydroxyzine  50 mg daily PRN anxiety/sleep (using infrequently)  # Sleep disruption Past medication trials: melatonin Status of problem: stable Interventions: -- Atarax  PRN as above   Patient was given contact information for behavioral health clinic and was instructed to call 911 for emergencies.   Subjective:  Chief  Complaint:  Chief Complaint  Patient presents with   Follow-up    Interval History:  Patient reports that since the last visit she has been holding steady overall and has found the increased dose of sertraline  helpful for body dysmorphia, noting she has not been as fixated and reports no compensatory behaviors. She describes her mood as improved and states depressive symptoms are better. Patient reports that anxiety symptoms have also improved and states she uses hydroxyzine  approximately once every other week as needed. She describes limited enjoyment of therapy overall but reports she does like and finds benefit in the homework assignments. Patient reports full medication compliance and states she has been taking medications as directed without missed doses, and she denies any adverse effects. She describes chronic difficulty with sleep initiation but reports sleeping seven to eight hours per night and states she feels rested in the morning. Patient reports good appetite and states her energy is adequate. She describes daily caffeine intake of approximately four cups of coffee. Patient reports no recent use of alcohol, tobacco, or cannabis. She describes no suicidal ideation, denies homicidal ideation, and reports no auditory or visual hallucinations.     Visit Diagnosis:  No diagnosis found.   Past Psychiatric History:  Diagnoses: GAD, MDD, body dysmorphia Medication trials: Celexa  40 mg (effective but still experiencing symptoms); Prozac (N/V; nervous); Zoloft  50 mg (2016); Lexapro, Paxil (didn't do well); Vyvanse ; hydroxyzine ; gabapentin  (swelling) Previous psychiatrist/therapist: last seen >3-4 years ago by psychiatrist  Hospitalizations: denies Suicide attempts: attempted hanging 4 years ago but found by husband SIB: denies outside of skin picking when anxious Hx of violence towards others: denies Current access to guns: denies Hx of trauma/abuse: denies (may have been exposed to  abuse when younger but cannot remember) Substance use:    --  Etoh: denies             -- Tobacco: denies              -- Denies use of cannabis, unprescribed benzos, opioids, stimulants  Past Medical History:  Past Medical History:  Diagnosis Date   Allergy    seasonal   Anemia    Anxiety and depression    per patient diagnosed by psychiatrist   Appendicitis 07/20/2012   Arthritis    Bilateral swelling of feet    resolved   Blood in stool    Chest pain    Chicken pox    Chronic fatigue syndrome    Connective tissue disease    Constipation    Depression    postpartum with one child   Essential hypertension 07/13/2021   history no longer taking medication   Fatty liver    GERD 01/31/2007   Qualifier: Diagnosis of  By: Wilhemina RMA, [name]     Hx of degenerative disc disease    Hyperlipidemia    postpartum   IBS (irritable bowel syndrome)    Iron deficiency anemia, unspecified    Osteoarthritis    Palpitations    PONV (postoperative nausea and vomiting)    Positive ANA (antinuclear antibody)    RAYNAUD'S DISEASE 01/31/2007   Qualifier: Diagnosis of  By: Wilhemina RMA, [name]     Seasonal allergies    Urticaria    Vitamin D  deficiency     Past Surgical History:  Procedure Laterality Date   ABDOMINOPLASTY  2015   mini   ANTERIOR FUSION CERVICAL SPINE  07/2020   CESAREAN SECTION     x3   CHOLECYSTECTOMY N/A 09/17/2022   Procedure: LAPAROSCOPIC CHOLECYSTECTOMY;  Surgeon: Signe Mitzie LABOR, MD;  Location: [facility];  Service: General;  Laterality: N/A;   COLONOSCOPY  01/2020   LAPAROSCOPIC APPENDECTOMY Right 07/20/2012   Procedure: APPENDECTOMY LAPAROSCOPIC;  Surgeon: Krystal CHRISTELLA Spinner, MD;  Location: [facility];  Service: General;  Laterality: Right;   TOOTH EXTRACTION  07/2023   WISDOM TOOTH EXTRACTION      Family Psychiatric History:  -- Mom: anxiety -- Dad: alcohol use disorder, opioid use disorder -- Son: autism spectrum disorder -- Sister: anxiety, depression -- Maternal  cousin 1: OCD -- Maternal cousin 2/3: schizophrenia Denies family history of death by suicide.   Family History:  Family History  Problem Relation Age of Onset   Hyperlipidemia Mother    Early menopause Mother    Depression Mother    Anxiety disorder Mother    Other Father        no contact since infant   Alcoholism Father    Drug abuse Father    Raynaud syndrome Sister    Anxiety disorder Sister    Depression Sister    Immunodeficiency Maternal Aunt    Heart attack Maternal Aunt 78   CAD Maternal Aunt    Sleep apnea Maternal Aunt    Lupus Maternal Uncle    Leukemia Maternal Uncle    Cancer Maternal Grandmother        melanoma   Stroke Maternal Grandmother 47   Arthritis Maternal Grandmother    Hyperlipidemia Maternal Grandmother    Hypertension Maternal Grandmother    Breast cancer Maternal Grandmother 72   Cancer Maternal Grandfather        bladder   Bladder Cancer Maternal Grandfather    Autism Son    Healthy Son    Healthy Son    Multiple  sclerosis Cousin    Sleep apnea Niece    Lung cancer Other        all but one of grandmother's siblings    Friedreich's ataxia Niece/Nephew    Colon cancer Neg Hx    Esophageal cancer Neg Hx     Social History:  Social History   Socioeconomic History   Marital status: Married    Spouse name: Not on file   Number of children: 3   Years of education: Not on file   Highest education level: Bachelor's degree (e.g., BA, AB, BS)  Occupational History   Occupation: Stay at home spouse   Occupation: firefighter  Tobacco Use   Smoking status: Never    Passive exposure: Past   Smokeless tobacco: Never  Vaping Use   Vaping status: Never Used  Substance and Sexual Activity   Alcohol use: No   Drug use: No   Sexual activity: Not Currently    Comment: husband had vasectomy  Other Topics Concern   Not on file  Social History Narrative   Work or School: homemaker, 3 kids - 9,7 and 3 (2014)      Home Situation: lives  with husband and children      Spiritual Beliefs: none      Lifestyle: hot yoga (used to), weights and cardio, tries to eat a healthy diet      Lives in a 2 story home      Right handed      Caffeine: 24-36 oz pepsi zero per day    Social Drivers of Health   Tobacco Use: Low Risk (04/29/2024)   Patient History    Smoking Tobacco Use: Never    Smokeless Tobacco Use: Never    Passive Exposure: Past  Financial Resource Strain: Low Risk (07/12/2021)   Overall Financial Resource Strain (CARDIA)    Difficulty of Paying Living Expenses: Not hard at all  Food Insecurity: Low Risk (07/14/2023)   Received from [facility]   Epic    Within the past 12 months, you worried that your food would run out before you got money to buy more: Never true    Within the past 12 months, the food you bought just didn't last and you didn't have money to get more. : Never true  Transportation Needs: No Transportation Needs (07/14/2023)   Received from [facility]    In the past 12 months, has lack of reliable transportation kept you from medical appointments, meetings, work or from getting things needed for daily living? : No  Physical Activity: Inactive (11/12/2022)   Exercise Vital Sign    Days of Exercise per Week: 0 days    Minutes of Exercise per Session: 0 min  Stress: No Stress Concern Present (07/12/2021)   [employer] of Occupational [employer] - Occupational Stress Questionnaire    Feeling of Stress : Only a little  Social Connections: Unknown (04/09/2022)   Received from [facility]   Social Network    Social Network: Not on file  Depression [phone]): Low Risk (02/24/2024)   Depression (PHQ2-9)    PHQ-2 Score: 0  Alcohol Screen: Low Risk (11/12/2022)   Alcohol Screen    Last Alcohol Screening Score (AUDIT): 0  Housing: Low Risk (07/14/2023)   Received from [facility]   Epic    What is your living situation today?: I have a steady place to live    Think about the place  you live. Do you have  problems with any of the following? Choose all that apply:: None/None on this list  Utilities: Low Risk (07/14/2023)   Received from [facility]   Utilities    In the past 12 months has the electric, gas, oil, or water company threatened to shut off services in your home? : No  Health Literacy: Not on file    Allergies:  Allergies  Allergen Reactions   Clarithromycin Shortness Of Breath    REACTION: hives   Polyethylene Glycol 2000 Dimyristoyl Glycerol Anaphylaxis   Latex     Skin irritation from gloves    Current Medications: Current Outpatient Medications  Medication Sig Dispense Refill   Calcium -Magnesium -Vitamin D  (CITRACAL SLOW RELEASE PO) Take 1 tablet by mouth daily.     esomeprazole  (NEXIUM ) 40 MG capsule TAKE 1 CAPSULE BY MOUTH ONCE A DAY 30 capsule 0   Ferrous Sulfate (IRON PO) Take 65 mg by mouth 3 (three) times a week.     hydroxychloroquine  (PLAQUENIL ) 200 MG tablet TAKE 1 TABLET BY MOUTH TWICE DAILY MONDAY THROUGH FRIDAY 120 tablet 0   hydrOXYzine  (ATARAX ) 50 MG tablet Take 1 tablet (50 mg total) by mouth daily as needed for anxiety. 30 tablet 2   levocetirizine (XYZAL ) 5 MG tablet 1 tablet in the evening Orally twice per day     rosuvastatin  (CRESTOR ) 10 MG tablet Take 1 tablet (10 mg total) by mouth daily. 90 tablet 1   sertraline  (ZOLOFT ) 100 MG tablet Take 2 tablets (200 mg total) by mouth daily. 180 tablet 1   valsartan  (DIOVAN ) 40 MG tablet Take 40 mg by mouth daily.     VITAMIN D  PO Take 2,000 Units by mouth daily.      No current facility-administered medications for this visit.    ROS: See above  Objective:  Psychiatric Specialty Exam: Last menstrual period 04/27/2024.There is no height or weight on file to calculate BMI.  General Appearance: Casual and Fairly Groomed  Eye Contact:  Good  Speech:  Clear and Coherent and Normal Rate  Volume:  Normal  Mood:  good   Affect:  Euthymic; calm  Thought Content: Does not endorse  AVH   Suicidal Thoughts: Denies  Homicidal Thoughts:  No  Thought Process:  Goal Directed and Linear  Orientation:  Full (Time, Place, and Person)    Memory:  Grossly intact  Judgment:  Good  Insight:  Good  Concentration:  Concentration: Good  Recall:  not formally assessed  Fund of Knowledge: Good  Language: Good  Psychomotor Activity:  Normal  Akathisia:  No  AIMS (if indicated): not done  Assets:  Communication Skills Desire for Improvement Housing Intimacy Leisure Time Resilience Social Support Talents/Skills Transportation  ADL's:  Intact  Cognition: WNL  Sleep:  Good   PE: General: no acute distress  Pulm: no increased work of breathing on room air  MSK: all extremity movements appear intact  Neuro: no focal neurological deficits observed    Metabolic Disorder Labs: Lab Results  Component Value Date   HGBA1C 5.1 03/11/2024   MPG 100 03/11/2024   MPG 103 04/17/2020   No results found for: PROLACTIN Lab Results  Component Value Date   CHOL 247 (H) 08/14/2021   TRIG 145 08/14/2021   HDL 61 08/14/2021   CHOLHDL 3 11/29/2020   VLDL 17.8 11/29/2020   LDLCALC 160 (H) 08/14/2021   LDLCALC 152 (H) 11/29/2020   Lab Results  Component Value Date   TSH 1.74 03/11/2024   TSH 2.50 07/10/2021  Therapeutic Level Labs: No results found for: LITHIUM No results found for: VALPROATE No results found for: CBMZ  Screenings: GAD-7    Flowsheet Row Counselor from 11/12/2022 in [facility] Office Visit from 03/01/2019 in [facility] [place]  Total GAD-7 Score 11 15   PHQ2-9    Flowsheet Row Office Visit from 02/24/2024 in [facility] [place] Counselor from 11/12/2022 in [facility] Office Visit from 08/14/2021 in [facility] Visit from 08/06/2021 in [facility] [place] Office Visit  from 07/13/2021 in [facility] [place]  PHQ-2 Total Score 0 3 5 0 0  PHQ-9 Total Score -- 13 13 1  --   Flowsheet Row Counselor from 11/12/2022 in [facility] Admission (Discharged) from 09/17/2022 in [place] [facility] ED from 08/25/2022 in [facility]  C-SSRS RISK CATEGORY Low Risk No Risk No Risk    Collaboration of Care: Collaboration of Care: Medication Management AEB active medication management and Psychiatrist AEB established with this provider  Patient/Guardian was advised Release of Information must be obtained prior to any record release in order to collaborate their care with an outside provider. Patient/Guardian was advised if they have not already done so to contact the registration department to sign all necessary forms in order for us  to release information regarding their care.   Consent: Patient/Guardian gives verbal consent for treatment and assignment of benefits for services provided during this visit. Patient/Guardian expressed understanding and agreed to proceed.    I provided 30 minutes dedicated to the care of this patient via video on the date of this encounter to include chart review, face-to-face time with the patient, medication management/counseling, documentation, brief supportive psychotherapy.  Marlo Masson, MD 05/26/2024, 7:42 AM

## 2024-05-27 ENCOUNTER — Ambulatory Visit: Admitting: Obstetrics and Gynecology

## 2024-05-27 ENCOUNTER — Encounter: Payer: Self-pay | Admitting: Obstetrics and Gynecology

## 2024-05-27 VITALS — BP 122/78 | HR 81 | Ht 63.0 in | Wt 164.0 lb

## 2024-05-27 DIAGNOSIS — Z01818 Encounter for other preprocedural examination: Secondary | ICD-10-CM

## 2024-05-27 MED ORDER — OXYCODONE HCL 5 MG PO TABS: 5.0000 mg | ORAL_TABLET | ORAL | 0 refills | Status: AC | PRN

## 2024-05-27 MED ORDER — IBUPROFEN 800 MG PO TABS: 800.0000 mg | ORAL_TABLET | Freq: Three times a day (TID) | ORAL | 1 refills | Status: AC | PRN

## 2024-05-27 MED ORDER — METOCLOPRAMIDE HCL 10 MG PO TABS: 10.0000 mg | ORAL_TABLET | Freq: Three times a day (TID) | ORAL | 0 refills | Status: AC | PRN

## 2024-05-27 NOTE — Progress Notes (Signed)
 "   PREOP H&P for robotic hysterectomy, bilateral salpingectomy, cystoscopy  GYNECOLOGY  VISIT   Surgical consult from Dr. Nikki  HPI: Per Dr. Cyrilla note  44 y.o.   Married  Caucasian female   G3P0 with Patient's last menstrual period was 04/27/2024.   here for: Sonohysterogram & endometrial biopsy for thickened endometrium noted on outside pelvic ultrasound at [facility] on 12/25/23.  Endometrium measured 18 mm.    Patient endorses heavy and painful periods.  Last 2 periods were normal.  Prior periods were associated with irregular bleeding in between.         Hx C/S x 3.   Prior surgical consultation indicating that her uterus is attached to her abdominal wall, noted by surgeon when she had her appendix was removed 3/10/014. She states she has a picture at home of the surgical findings.    UPT today:  negative.   Today, patient reports very painful periods that last about 7 days and she can use a tampon an hour and has anemia.  Pain radiates in her back and lower pelvis. Picture from appendectomy with adhesions from uterus to anterior abdominal wall. She is frustrated with the bleeding and pain and would like to move forward with a definitive treatment with the [facility].  GYNECOLOGIC HISTORY: Patient's last menstrual period was 04/27/2024. Contraception:  Vasectomy  Menopausal hormone therapy:  n/a Last 2 paps:  03/09/24 neg HR HPV neg, 11/27/20 ASCUS, HR HPV neg  History of abnormal Pap or positive HPV:  yes Mammogram:  12/25/23 BIRADS Cat 1 neg (care everywhere)         OB History     Gravida  3   Para      Term      Preterm      AB      Living  3      SAB      IAB      Ectopic      Multiple      Live Births  3              Patient Active Problem List   Diagnosis Date Noted   Body dysmorphic disorder 09/02/2022   Generalized anxiety disorder with panic attacks 09/02/2022   MDD (major depressive disorder), recurrent, in full remission 09/02/2022    Insulin resistance 09/24/2021   Class 1 obesity with serious comorbidity and body mass index (BMI) of 30.0 to 30.9 in adult 09/24/2021   Benign essential hypertension 07/13/2021   Foraminal stenosis of cervical region 07/04/2020   Adverse effect of other viral vaccines, subsequent encounter 01/27/2020   Spinal stenosis of cervical region 12/28/2019   Fatigue 08/20/2019   Left shoulder pain 07/19/2014   RAYNAUD'S DISEASE 01/31/2007    Past Medical History:  Diagnosis Date   Allergy    seasonal   Anemia    Anxiety and depression    per patient diagnosed by psychiatrist   Appendicitis 07/20/2012   Arthritis    Bilateral swelling of feet    resolved   Blood in stool    Chest pain    Chicken pox    Chronic fatigue syndrome    Connective tissue disease    Constipation    Depression    postpartum with one child   Essential hypertension 07/13/2021   history no longer taking medication   Fatty liver    GERD 01/31/2007   Qualifier: Diagnosis of  By: Wilhemina RMA, [name]     Hx  of degenerative disc disease    Hyperlipidemia    postpartum   IBS (irritable bowel syndrome)    Iron deficiency anemia, unspecified    Osteoarthritis    Palpitations    PONV (postoperative nausea and vomiting)    Positive ANA (antinuclear antibody)    RAYNAUD'S DISEASE 01/31/2007   Qualifier: Diagnosis of  By: Wilhemina RMA, [name]     Seasonal allergies    Urticaria    Vitamin D  deficiency     Past Surgical History:  Procedure Laterality Date   ABDOMINOPLASTY  2015   mini   ANTERIOR FUSION CERVICAL SPINE  07/2020   CESAREAN SECTION     x3   CHOLECYSTECTOMY N/A 09/17/2022   Procedure: LAPAROSCOPIC CHOLECYSTECTOMY;  Surgeon: Signe Mitzie LABOR, MD;  Location: [facility];  Service: General;  Laterality: N/A;   COLONOSCOPY  01/2020   LAPAROSCOPIC APPENDECTOMY Right 07/20/2012   Procedure: APPENDECTOMY LAPAROSCOPIC;  Surgeon: Krystal CHRISTELLA Spinner, MD;  Location: [facility];  Service: General;  Laterality: Right;   TOOTH  EXTRACTION  07/2023   WISDOM TOOTH EXTRACTION      Current Outpatient Medications  Medication Sig Dispense Refill   Calcium -Magnesium -Vitamin D  (CITRACAL SLOW RELEASE PO) Take 1 tablet by mouth daily.     esomeprazole  (NEXIUM ) 40 MG capsule TAKE 1 CAPSULE BY MOUTH ONCE A DAY 30 capsule 0   Ferrous Sulfate (IRON PO) Take 65 mg by mouth 3 (three) times a week.     hydroxychloroquine  (PLAQUENIL ) 200 MG tablet TAKE 1 TABLET BY MOUTH TWICE DAILY MONDAY THROUGH FRIDAY 120 tablet 0   hydrOXYzine  (ATARAX ) 50 MG tablet Take 1 tablet (50 mg total) by mouth daily as needed for anxiety. 30 tablet 2   levocetirizine (XYZAL ) 5 MG tablet 1 tablet in the evening Orally twice per day     rosuvastatin  (CRESTOR ) 10 MG tablet Take 1 tablet (10 mg total) by mouth daily. 90 tablet 1   sertraline  (ZOLOFT ) 100 MG tablet Take 2 tablets (200 mg total) by mouth daily. 180 tablet 1   valsartan  (DIOVAN ) 40 MG tablet Take 40 mg by mouth daily.     VITAMIN D  PO Take 2,000 Units by mouth daily.      No current facility-administered medications for this visit.     ALLERGIES: Clarithromycin, Polyethylene glycol 2000 dimyristoyl glycerol, and Latex  Family History  Problem Relation Age of Onset   Hyperlipidemia Mother    Early menopause Mother    Depression Mother    Anxiety disorder Mother    Other Father        no contact since infant   Alcoholism Father    Drug abuse Father    Raynaud syndrome Sister    Anxiety disorder Sister    Depression Sister    Immunodeficiency Maternal Aunt    Heart attack Maternal Aunt 56   CAD Maternal Aunt    Sleep apnea Maternal Aunt    Lupus Maternal Uncle    Leukemia Maternal Uncle    Cancer Maternal Grandmother        melanoma   Stroke Maternal Grandmother 24   Arthritis Maternal Grandmother    Hyperlipidemia Maternal Grandmother    Hypertension Maternal Grandmother    Breast cancer Maternal Grandmother 72   Cancer Maternal Grandfather        bladder   Bladder Cancer  Maternal Grandfather    Autism Son    Healthy Son    Healthy Son    Multiple sclerosis Cousin  Sleep apnea Niece    Lung cancer Other        all but one of grandmother's siblings    Friedreich's ataxia Niece/Nephew    Colon cancer Neg Hx    Esophageal cancer Neg Hx     Social History   Socioeconomic History   Marital status: Married    Spouse name: Not on file   Number of children: 3   Years of education: Not on file   Highest education level: Bachelor's degree (e.g., BA, AB, BS)  Occupational History   Occupation: Stay at home spouse   Occupation: firefighter  Tobacco Use   Smoking status: Never    Passive exposure: Past   Smokeless tobacco: Never  Vaping Use   Vaping status: Never Used  Substance and Sexual Activity   Alcohol use: No   Drug use: No   Sexual activity: Not Currently    Comment: husband had vasectomy  Other Topics Concern   Not on file  Social History Narrative   Work or School: homemaker, 3 kids - 9,7 and 3 (2014)      Home Situation: lives with husband and children      Spiritual Beliefs: none      Lifestyle: hot yoga (used to), weights and cardio, tries to eat a healthy diet      Lives in a 2 story home      Right handed      Caffeine: 24-36 oz pepsi zero per day    Social Drivers of Health   Tobacco Use: Low Risk (05/26/2024)   Patient History    Smoking Tobacco Use: Never    Smokeless Tobacco Use: Never    Passive Exposure: Past  Financial Resource Strain: Low Risk (07/12/2021)   Overall Financial Resource Strain (CARDIA)    Difficulty of Paying Living Expenses: Not hard at all  Food Insecurity: Low Risk (07/14/2023)   Received from [facility]   Epic    Within the past 12 months, you worried that your food would run out before you got money to buy more: Never true    Within the past 12 months, the food you bought just didn't last and you didn't have money to get more. : Never true  Transportation Needs: No Transportation Needs  (07/14/2023)   Received from [facility]    In the past 12 months, has lack of reliable transportation kept you from medical appointments, meetings, work or from getting things needed for daily living? : No  Physical Activity: Inactive (11/12/2022)   Exercise Vital Sign    Days of Exercise per Week: 0 days    Minutes of Exercise per Session: 0 min  Stress: No Stress Concern Present (07/12/2021)   [employer]ational [employer] - Occupational Stress Questionnaire    Feeling of Stress : Only a little  Social Connections: Unknown (04/09/2022)   [facility] from [facility]   Social Network    Social Network: Not on file  Intimate Partner Violence: Not At Risk (11/12/2022)   Humiliation, Afraid, Rape, and Kick questionnaire    Fear of Current or Ex-Partner: No    Emotionally Abused: No    Physically Abused: No    Sexually Abused: No  Depression (PHQ2-9): Low Risk (02/24/2024)   Depression (PHQ2-9)    PHQ-2 Score: 0  Alcohol Screen: Low Risk (11/12/2022)   Alcohol Screen    Last Alcohol Screening Score (AUDIT): 0  Housing: Low Risk (07/14/2023)   Received  from [facility]   Epic    What is your living situation today?: I have a steady place to live    Think about the place you live. Do you have problems with any of the following? Choose all that apply:: None/None on this list  Utilities: Low Risk (07/14/2023)   Received from [facility]   Utilities    In the past 12 months has the electric, gas, oil, or water company threatened to shut off services in your home? : No  Health Literacy: Not on file    Review of Systems  See HPI.   PHYSICAL EXAMINATION:   LMP 04/27/2024     General appearance: alert, cooperative and appears stated age  Pelvic: External genitalia:  no lesions              Urethra:  normal appearing urethra with no masses, tenderness or lesions              Bartholins and Skenes: normal                 Vagina: normal appearing vagina with  normal color and discharge, no lesions              Cervix: flush with vaginal apex.  No palpable cervix on bimanual exam.                 Bimanual Exam:  Uterus:  normal size, contour, position, consistency, mobility, non-tender              Adnexa: no mass, fullness, tenderness       Pelvic ultrasound:  transabdominal and transvaginal Uterus  7.95 x 5.28 x 4.26 cm.  No myometrial masses.  EMS 4.34 mm Filling defect:  19 x 10 mm.  Feeder vessel noted. Fluid around the filling defect.  Left ovary  3.79 x 1.88 x 1.90 cm. Multiple follicles noted 15.9 mm, 16.7 mm, 9.1 mm, 10.0 mm. Right ovary 1.93 x 1.10 x 1.39 cm.  10.3 mm follicle. No adnexal masses.  No free fluid.   Attempt at sonohysterogram and endometrial biopsy - ultimately not performed. Consent and time out were done.    Speculum placed in vagina and cervix cleansed with Hibiclens .  Tenaculum will not apply to the anterior or posterior cervical lip due to the cervix being flush with the vagina.   Procedures aborted.   Chaperone was present for exam:  Kari HERO, CMA  ASSESSMENT:  Menorrhagia with regular menses.  Thickened endometrium with endometrial mass.  Dysmenorrhea.  Cervix flush with vaginal apex.  Hx C/S x 3.  Reported hx of adhesions between the uterus and the anterior abdominal wall.   PLAN:  Sonohysterogram aborted No EMB was possible.  H/o LTCS x3 with dense lower uterine segment adhesions Menorrhagia Dysmenorrhea Anemia Likely adenomyosis   The risk, benefits, alternatives of the procedure were discussed with patient including but not limited to the risk for bleeding, infection, injury to surrounding structures such as the bowel, vessels, bladder, and ureters were reviewed.  The risk for blood products and risk for an additional procedure, and risk for laparotomy in an extreme event was discussed.  The risk for blood clots are also discussed.  Postoperative care instructions were discussed and reviewed  with patient.  Postoperative medications were sent to her pharmacy and patient was instructed to pick up prior to surgery.  She will have a driver to take her there and take her home and she agrees that that  person will stay with her overnight. Post care instructions were also put in the wrap-up section of this note for patient, as a reminder of care instructions. All questions were answered and patient agrees and would like to proceed with the procedure.    30 min  total time was spent for this patient encounter, including preparation, face-to-face counseling with the patient, coordination of care, and documentation of the encounter.    "

## 2024-05-27 NOTE — H&P (View-Only) (Signed)
 "   PREOP H&P for robotic hysterectomy, bilateral salpingectomy, cystoscopy  GYNECOLOGY  VISIT   Surgical consult from Dr. Nikki  HPI: Per Dr. Cyrilla note  44 y.o.   Married  Caucasian female   G3P0 with Patient's last menstrual period was 04/27/2024.   here for: Sonohysterogram & endometrial biopsy for thickened endometrium noted on outside pelvic ultrasound at [facility] on 12/25/23.  Endometrium measured 18 mm.    Patient endorses heavy and painful periods.  Last 2 periods were normal.  Prior periods were associated with irregular bleeding in between.         Hx C/S x 3.   Prior surgical consultation indicating that her uterus is attached to her abdominal wall, noted by surgeon when she had her appendix was removed 3/10/014. She states she has a picture at home of the surgical findings.    UPT today:  negative.   Today, patient reports very painful periods that last about 7 days and she can use a tampon an hour and has anemia.  Pain radiates in her back and lower pelvis. Picture from appendectomy with adhesions from uterus to anterior abdominal wall. She is frustrated with the bleeding and pain and would like to move forward with a definitive treatment with the [facility].  GYNECOLOGIC HISTORY: Patient's last menstrual period was 04/27/2024. Contraception:  Vasectomy  Menopausal hormone therapy:  n/a Last 2 paps:  03/09/24 neg HR HPV neg, 11/27/20 ASCUS, HR HPV neg  History of abnormal Pap or positive HPV:  yes Mammogram:  12/25/23 BIRADS Cat 1 neg (care everywhere)         OB History     Gravida  3   Para      Term      Preterm      AB      Living  3      SAB      IAB      Ectopic      Multiple      Live Births  3              Patient Active Problem List   Diagnosis Date Noted   Body dysmorphic disorder 09/02/2022   Generalized anxiety disorder with panic attacks 09/02/2022   MDD (major depressive disorder), recurrent, in full remission 09/02/2022    Insulin resistance 09/24/2021   Class 1 obesity with serious comorbidity and body mass index (BMI) of 30.0 to 30.9 in adult 09/24/2021   Benign essential hypertension 07/13/2021   Foraminal stenosis of cervical region 07/04/2020   Adverse effect of other viral vaccines, subsequent encounter 01/27/2020   Spinal stenosis of cervical region 12/28/2019   Fatigue 08/20/2019   Left shoulder pain 07/19/2014   RAYNAUD'S DISEASE 01/31/2007    Past Medical History:  Diagnosis Date   Allergy    seasonal   Anemia    Anxiety and depression    per patient diagnosed by psychiatrist   Appendicitis 07/20/2012   Arthritis    Bilateral swelling of feet    resolved   Blood in stool    Chest pain    Chicken pox    Chronic fatigue syndrome    Connective tissue disease    Constipation    Depression    postpartum with one child   Essential hypertension 07/13/2021   history no longer taking medication   Fatty liver    GERD 01/31/2007   Qualifier: Diagnosis of  By: Wilhemina RMA, [name]     Hx  of degenerative disc disease    Hyperlipidemia    postpartum   IBS (irritable bowel syndrome)    Iron deficiency anemia, unspecified    Osteoarthritis    Palpitations    PONV (postoperative nausea and vomiting)    Positive ANA (antinuclear antibody)    RAYNAUD'S DISEASE 01/31/2007   Qualifier: Diagnosis of  By: Wilhemina RMA, [name]     Seasonal allergies    Urticaria    Vitamin D  deficiency     Past Surgical History:  Procedure Laterality Date   ABDOMINOPLASTY  2015   mini   ANTERIOR FUSION CERVICAL SPINE  07/2020   CESAREAN SECTION     x3   CHOLECYSTECTOMY N/A 09/17/2022   Procedure: LAPAROSCOPIC CHOLECYSTECTOMY;  Surgeon: Signe Mitzie LABOR, MD;  Location: [facility];  Service: General;  Laterality: N/A;   COLONOSCOPY  01/2020   LAPAROSCOPIC APPENDECTOMY Right 07/20/2012   Procedure: APPENDECTOMY LAPAROSCOPIC;  Surgeon: Krystal CHRISTELLA Spinner, MD;  Location: [facility];  Service: General;  Laterality: Right;   TOOTH  EXTRACTION  07/2023   WISDOM TOOTH EXTRACTION      Current Outpatient Medications  Medication Sig Dispense Refill   Calcium -Magnesium -Vitamin D  (CITRACAL SLOW RELEASE PO) Take 1 tablet by mouth daily.     esomeprazole  (NEXIUM ) 40 MG capsule TAKE 1 CAPSULE BY MOUTH ONCE A DAY 30 capsule 0   Ferrous Sulfate (IRON PO) Take 65 mg by mouth 3 (three) times a week.     hydroxychloroquine  (PLAQUENIL ) 200 MG tablet TAKE 1 TABLET BY MOUTH TWICE DAILY MONDAY THROUGH FRIDAY 120 tablet 0   hydrOXYzine  (ATARAX ) 50 MG tablet Take 1 tablet (50 mg total) by mouth daily as needed for anxiety. 30 tablet 2   levocetirizine (XYZAL ) 5 MG tablet 1 tablet in the evening Orally twice per day     rosuvastatin  (CRESTOR ) 10 MG tablet Take 1 tablet (10 mg total) by mouth daily. 90 tablet 1   sertraline  (ZOLOFT ) 100 MG tablet Take 2 tablets (200 mg total) by mouth daily. 180 tablet 1   valsartan  (DIOVAN ) 40 MG tablet Take 40 mg by mouth daily.     VITAMIN D  PO Take 2,000 Units by mouth daily.      No current facility-administered medications for this visit.     ALLERGIES: Clarithromycin, Polyethylene glycol 2000 dimyristoyl glycerol, and Latex  Family History  Problem Relation Age of Onset   Hyperlipidemia Mother    Early menopause Mother    Depression Mother    Anxiety disorder Mother    Other Father        no contact since infant   Alcoholism Father    Drug abuse Father    Raynaud syndrome Sister    Anxiety disorder Sister    Depression Sister    Immunodeficiency Maternal Aunt    Heart attack Maternal Aunt 56   CAD Maternal Aunt    Sleep apnea Maternal Aunt    Lupus Maternal Uncle    Leukemia Maternal Uncle    Cancer Maternal Grandmother        melanoma   Stroke Maternal Grandmother 24   Arthritis Maternal Grandmother    Hyperlipidemia Maternal Grandmother    Hypertension Maternal Grandmother    Breast cancer Maternal Grandmother 72   Cancer Maternal Grandfather        bladder   Bladder Cancer  Maternal Grandfather    Autism Son    Healthy Son    Healthy Son    Multiple sclerosis Cousin  Sleep apnea Niece    Lung cancer Other        all but one of grandmother's siblings    Friedreich's ataxia Niece/Nephew    Colon cancer Neg Hx    Esophageal cancer Neg Hx     Social History   Socioeconomic History   Marital status: Married    Spouse name: Not on file   Number of children: 3   Years of education: Not on file   Highest education level: Bachelor's degree (e.g., BA, AB, BS)  Occupational History   Occupation: Stay at home spouse   Occupation: firefighter  Tobacco Use   Smoking status: Never    Passive exposure: Past   Smokeless tobacco: Never  Vaping Use   Vaping status: Never Used  Substance and Sexual Activity   Alcohol use: No   Drug use: No   Sexual activity: Not Currently    Comment: husband had vasectomy  Other Topics Concern   Not on file  Social History Narrative   Work or School: homemaker, 3 kids - 9,7 and 3 (2014)      Home Situation: lives with husband and children      Spiritual Beliefs: none      Lifestyle: hot yoga (used to), weights and cardio, tries to eat a healthy diet      Lives in a 2 story home      Right handed      Caffeine: 24-36 oz pepsi zero per day    Social Drivers of Health   Tobacco Use: Low Risk (05/26/2024)   Patient History    Smoking Tobacco Use: Never    Smokeless Tobacco Use: Never    Passive Exposure: Past  Financial Resource Strain: Low Risk (07/12/2021)   Overall Financial Resource Strain (CARDIA)    Difficulty of Paying Living Expenses: Not hard at all  Food Insecurity: Low Risk (07/14/2023)   Received from [facility]   Epic    Within the past 12 months, you worried that your food would run out before you got money to buy more: Never true    Within the past 12 months, the food you bought just didn't last and you didn't have money to get more. : Never true  Transportation Needs: No Transportation Needs  (07/14/2023)   Received from [facility]    In the past 12 months, has lack of reliable transportation kept you from medical appointments, meetings, work or from getting things needed for daily living? : No  Physical Activity: Inactive (11/12/2022)   Exercise Vital Sign    Days of Exercise per Week: 0 days    Minutes of Exercise per Session: 0 min  Stress: No Stress Concern Present (07/12/2021)   [employer] of Occupational [employer] - Occupational Stress Questionnaire    Feeling of Stress : Only a little  Social Connections: Unknown (04/09/2022)   Received from [facility]   Social Network    Social Network: Not on file  Intimate Partner Violence: Not At Risk (11/12/2022)   Humiliation, Afraid, Rape, and Kick questionnaire    Fear of Current or Ex-Partner: No    Emotionally Abused: No    Physically Abused: No    Sexually Abused: No  Depression (PHQ2-9): Low Risk (02/24/2024)   Depression (PHQ2-9)    PHQ-2 Score: 0  Alcohol Screen: Low Risk (11/12/2022)   Alcohol Screen    Last Alcohol Screening Score (AUDIT): 0  Housing: Low Risk (07/14/2023)   Received  from [facility]   Epic    What is your living situation today?: I have a steady place to live    Think about the place you live. Do you have problems with any of the following? Choose all that apply:: None/None on this list  Utilities: Low Risk (07/14/2023)   Received from [facility]   Utilities    In the past 12 months has the electric, gas, oil, or water company threatened to shut off services in your home? : No  Health Literacy: Not on file    Review of Systems  See HPI.   PHYSICAL EXAMINATION:   LMP 04/27/2024     General appearance: alert, cooperative and appears stated age  Pelvic: External genitalia:  no lesions              Urethra:  normal appearing urethra with no masses, tenderness or lesions              Bartholins and Skenes: normal                 Vagina: normal appearing vagina with  normal color and discharge, no lesions              Cervix: flush with vaginal apex.  No palpable cervix on bimanual exam.                 Bimanual Exam:  Uterus:  normal size, contour, position, consistency, mobility, non-tender              Adnexa: no mass, fullness, tenderness       Pelvic ultrasound:  transabdominal and transvaginal Uterus  7.95 x 5.28 x 4.26 cm.  No myometrial masses.  EMS 4.34 mm Filling defect:  19 x 10 mm.  Feeder vessel noted. Fluid around the filling defect.  Left ovary  3.79 x 1.88 x 1.90 cm. Multiple follicles noted 15.9 mm, 16.7 mm, 9.1 mm, 10.0 mm. Right ovary 1.93 x 1.10 x 1.39 cm.  10.3 mm follicle. No adnexal masses.  No free fluid.   Attempt at sonohysterogram and endometrial biopsy - ultimately not performed. Consent and time out were done.    Speculum placed in vagina and cervix cleansed with Hibiclens .  Tenaculum will not apply to the anterior or posterior cervical lip due to the cervix being flush with the vagina.   Procedures aborted.   Chaperone was present for exam:  Kari HERO, CMA  ASSESSMENT:  Menorrhagia with regular menses.  Thickened endometrium with endometrial mass.  Dysmenorrhea.  Cervix flush with vaginal apex.  Hx C/S x 3.  Reported hx of adhesions between the uterus and the anterior abdominal wall.   PLAN:  Sonohysterogram aborted No EMB was possible.  H/o LTCS x3 with dense lower uterine segment adhesions Menorrhagia Dysmenorrhea Anemia Likely adenomyosis   The risk, benefits, alternatives of the procedure were discussed with patient including but not limited to the risk for bleeding, infection, injury to surrounding structures such as the bowel, vessels, bladder, and ureters were reviewed.  The risk for blood products and risk for an additional procedure, and risk for laparotomy in an extreme event was discussed.  The risk for blood clots are also discussed.  Postoperative care instructions were discussed and reviewed  with patient.  Postoperative medications were sent to her pharmacy and patient was instructed to pick up prior to surgery.  She will have a driver to take her there and take her home and she agrees that that  person will stay with her overnight. Post care instructions were also put in the wrap-up section of this note for patient, as a reminder of care instructions. All questions were answered and patient agrees and would like to proceed with the procedure.    30 min  total time was spent for this patient encounter, including preparation, face-to-face counseling with the patient, coordination of care, and documentation of the encounter.    "

## 2024-05-27 NOTE — Patient Instructions (Signed)
 Robotic Laparoscopic Hysterectomy, Care After  The following information offers guidance on how to care for yourself after your procedure. Your health care provider may also give you more specific instructions. If you have problems or questions, contact your health care provider. What can I expect after the procedure? After the procedure, it is common to have: Pain, bruising, and numbness around your incisions. Tiredness (fatigue).  Abdominal bloating Poor appetite. Chest discomfort that radiates to your shoulder from the carbon dioxide gas for a few days after  Vaginal discharge or spotting. You will need to use a sanitary pad after this procedure.  HEAVY BLEEDING LIKE A PERIOD IS NOT NORMAL.  PLEASE CALL YOUR PROVIDER IF SOAKING A PAD or have copious discharge and or pain.  Feelings of sadness or other emotions.  If your ovaries were also removed, it is also common to have symptoms of menopause, such as hot flashes, night sweats, and lack of sleep (insomnia).  Ovaries should stay in if at all possible until at least the age of 62. Follow these instructions at home: Medicines Take over-the-counter and prescription medicines only as told by your health care provider. Ask your health care provider if the medicine prescribed to you: Requires you to avoid driving or using machinery. You cannot drive for 24 hours after anesthesia Can cause constipation. You may need to take these actions to prevent or treat constipation: Drink enough fluid to keep your urine pale yellow. Take over-the-counter or prescription medicines. Eat foods that are high in fiber, such as beans, whole grains, and fresh fruits and vegetables. Limit foods that are high in fat and processed sugars, such as fried or sweet foods.  Also, avoid spicy foods.  NAUSEA IS COMMON THE FIRST NIGHT OF SURGERY.  IF IT LASTS BEYOND 24 HOURS, CALL YOUR PROVIDER.  NAUSEA MEDICATION WAS GIVEN AT YOUR PREOP APPOINTMENT THAT YOU CAN TAKE  AFTER SURGERY. Incision care  Follow instructions from your health care provider about how to take care of your incisions. Make sure you: LEAVE INCISION OPEN AND DRY-NO BANDAGES Leave stitches (sutures), skin glue, or adhesive strips in place UNTIL 2 WEEKS THEN REMOVE IN THE SHOWER.  If adhesive strip edges start to loosen and curl up, you may trim the loose edges. Check your incision areas every day for signs of infection. Check for: More redness, swelling, or pain. Fluid or blood. Warmth. Pus or a bad smell. Activity  Rest as told by your health care provider. Avoid sitting for a long time without moving. Get up to take short walks every 1-2 hours. This is important to improve blood flow and breathing. Ask for help if you feel weak or unsteady.  If you are sore or tired, rest. Return to your normal activities as told by your health care provider. Ask your health care provider what activities are safe for you. Do not lift, push or pull anything that is heavier than 13 lb (4.5 kg), or the limit that you are told, for 6 WEEKS after surgery or until your health care provider says that it is safe. If you were given a sedative during the procedure, it can affect you for several hours. Do not drive or operate machinery until your health care provider says that it is safe. Lifestyle Do not use any products that contain nicotine or tobacco. These products include cigarettes, chewing tobacco, and vaping devices, such as e-cigarettes. These can delay healing after surgery. If you need help quitting, ask your health care provider.  Do not drink alcohol until your health care provider approves. Take a daily multivitamin and keep a high protein diet for wound healing  DO NOT HAVE INTERCOURSE UNTIL YOU ARE INSTRUCTED THAT IT IS SAFE TO DO SO  Post operative appointments need to be scheduled at 2, 6 and 10 weeks.  You can come anytime before these with any concerns.   Discussed and reviewed with patient  risks with early intercourse or use of any foreign objects (externally or internally) can increase your risk including but not limited to the risk of vaginal cuff separation and or infection, risks for bowel involvement, risk for emergent surgery, and hospital admission with need for antibiotics.  Discussed in cases with cuff separation and bowel involvement there may be the need for colostomy placement as well.  In no situation should she have intercourse unless cleared to do so.  This can be anywhere from 10 weeks or longer after surgery.  General instructions YOU MAY TAKE SHOWERS ONLY FOR 2 WEEKS AFTER SURGERY, THEN YOU MAY USE TUBS AND HOT TUBS OR SWIM AFTER THAT Do not douche, use tampons, or have sex for at least 10 weeks, or possibly longer. You will need to have an exam done in the office to be cleared to have intercourse. If you struggle with physical or emotional changes after your procedure, speak with your health care provider or a therapist.  IF YOU HAVE BURNING WITH URINATION, PLEASE CALL YOUR DOCTOR. BLADDER INFECTIONS MAY OCCUR AFTER SURGERY Try to have someone at home with you for the first week to help with your daily chores.  Most patients are driving by the end of the first week after the robotic hysterectomy. Wear compression stockings as told by your health care provider. These stockings help to prevent blood clots and reduce swelling in your legs. Keep all follow-up visits. This is important. Contact a health care provider if: You have any of these signs of infection: Chills or a fever 148f OR GREATER. More redness, swelling, or pain around an incision. Fluid or blood coming from an incision. Warmth coming from an incision. Pus or a bad smell coming from an incision. Burning with urination. Urinary frequency or cramping.   IF YOU HAVE THESE SYMPTOMS, PLEASE CALL THE OFFICE TO COME EVALUATE FOR A BLADDER INFECTION AT [phone] An incision opens. You feel dizzy or  light-headed. You have pain or bleeding when you urinate, or you are unable to urinate. You have abnormal vaginal discharge. You have pain that does not get better with medicine. Get help right away if: You have a fever and your symptoms suddenly get worse. You have severe abdominal pain. Heavy vaginal bleeding, like a period You have chest pain or shortness of breath. You may have chest pain and shortness of breath from the CO2 gas for a few days after surgery.  This is very common.  Walking, Gas-X and motrin  will usually help relieve this discomfort You faint. You have pain, swelling, or redness in your leg.  These symptoms may represent a serious problem that is an emergency. Do not wait to see if the symptoms will go away. Get medical help right away. Call your local emergency services (911 in the [place] Do not drive yourself to the hospital. Summary  CONSTIPATION MEDICATION AFTER SURGERY: COLACE, MOM, MIRALAX , GAS X are all helpful to have on hand, if needed.  FILL ALL POSTOP MEDICATION BEFORE SURGERY   [email or web address] is a nice blog site for women preparing for the  robotic hysterectomy

## 2024-05-28 ENCOUNTER — Encounter: Payer: Self-pay | Admitting: *Deleted

## 2024-06-03 ENCOUNTER — Encounter (HOSPITAL_COMMUNITY): Payer: Self-pay | Admitting: Obstetrics and Gynecology

## 2024-06-03 NOTE — Progress Notes (Signed)
 Spoke w/ [facility] Staff via phone for pre-op interview Lab needs dos----         Lab results------ COVID test -----patient states asymptomatic no test needed Arrive at 0730 NPO after MN NO Solid Food.  Clear liquids from MN until 0330 Pre-Surgery Ensure or G2:n/a  Med rec completed Medications to take morning of surgery none Diabetic medication n/a  GLP1 agonist last dose:n/a GLP1 instructions:n/a  Patient instructed no nail polish to be worn day of surgery Patient instructed to bring photo id and insurance card day of surgery Patient aware to have Driver (ride ) / caregiver    for 24 hours after surgery -  Patient Special Instructions ----- Pre-Op special Instructions -----  Patient verbalized understanding of instructions that were given at this phone interview. Patient denies chest pain, sob, fever, cough at the interview.

## 2024-06-08 DIAGNOSIS — Z01818 Encounter for other preprocedural examination: Secondary | ICD-10-CM

## 2024-06-09 MED ORDER — SODIUM CHLORIDE 0.9 % IV SOLN
INTRAVENOUS | Status: DC
  Filled 2024-06-09: qty 10

## 2024-06-09 NOTE — Progress Notes (Signed)
 Talked with patient to arrive at 1215. Clear liquids until 1115. Same instructions as before. Husband is driver and caregiver

## 2024-06-10 ENCOUNTER — Ambulatory Visit (HOSPITAL_COMMUNITY)

## 2024-06-10 ENCOUNTER — Encounter (HOSPITAL_COMMUNITY): Admission: RE | Disposition: A | Payer: Self-pay | Source: Home / Self Care | Attending: Obstetrics and Gynecology

## 2024-06-10 ENCOUNTER — Encounter (HOSPITAL_COMMUNITY): Payer: Self-pay | Admitting: Obstetrics and Gynecology

## 2024-06-10 ENCOUNTER — Ambulatory Visit (HOSPITAL_COMMUNITY)
Admission: RE | Admit: 2024-06-10 | Discharge: 2024-06-10 | Disposition: A | Discharge status: Home / Self Care | Attending: Obstetrics and Gynecology | Admitting: Obstetrics and Gynecology

## 2024-06-10 DIAGNOSIS — N946 Dysmenorrhea, unspecified: Secondary | ICD-10-CM

## 2024-06-10 DIAGNOSIS — N8003 Adenomyosis of the uterus: Secondary | ICD-10-CM

## 2024-06-10 DIAGNOSIS — D500 Iron deficiency anemia secondary to blood loss (chronic): Secondary | ICD-10-CM | POA: Diagnosis not present

## 2024-06-10 DIAGNOSIS — N83202 Unspecified ovarian cyst, left side: Secondary | ICD-10-CM

## 2024-06-10 DIAGNOSIS — R9389 Abnormal findings on diagnostic imaging of other specified body structures: Secondary | ICD-10-CM

## 2024-06-10 DIAGNOSIS — N920 Excessive and frequent menstruation with regular cycle: Secondary | ICD-10-CM | POA: Diagnosis not present

## 2024-06-10 DIAGNOSIS — Z01818 Encounter for other preprocedural examination: Secondary | ICD-10-CM

## 2024-06-10 LAB — CBC
HCT: 38.2 % (ref 36.0–46.0)
Hemoglobin: 12.6 g/dL (ref 12.0–15.0)
MCH: 26.8 pg (ref 26.0–34.0)
MCHC: 33 g/dL (ref 30.0–36.0)
MCV: 81.1 fL (ref 80.0–100.0)
Platelets: 360 10*3/uL (ref 150–400)
RBC: 4.71 MIL/uL (ref 3.87–5.11)
RDW: 14 % (ref 11.5–15.5)
WBC: 8.8 10*3/uL (ref 4.0–10.5)
nRBC: 0 % (ref 0.0–0.2)

## 2024-06-10 LAB — POCT PREGNANCY, URINE: Preg Test, Ur: NEGATIVE

## 2024-06-10 LAB — ABO/RH: ABO/RH(D): B POS

## 2024-06-10 LAB — TYPE AND SCREEN
ABO/RH(D): B POS
Antibody Screen: NEGATIVE

## 2024-06-10 MED ORDER — ONDANSETRON HCL 4 MG/2ML IJ SOLN: 4.0000 mg | Freq: Once | INTRAMUSCULAR | Status: DC | PRN

## 2024-06-10 MED ORDER — LIDOCAINE 2% (20 MG/ML) 5 ML SYRINGE
INTRAMUSCULAR | Status: DC | PRN
  Administered 2024-06-10: 100 mg via INTRAVENOUS

## 2024-06-10 MED ORDER — GABAPENTIN 300 MG PO CAPS
ORAL_CAPSULE | ORAL | Status: AC
  Filled 2024-06-10: qty 1

## 2024-06-10 MED ORDER — DEXAMETHASONE SOD PHOSPHATE PF 10 MG/ML IJ SOLN
INTRAMUSCULAR | Status: AC
  Filled 2024-06-10: qty 1

## 2024-06-10 MED ORDER — CHLORHEXIDINE GLUCONATE 0.12 % MT SOLN
15.0000 mL | Freq: Once | OROMUCOSAL | Status: AC
  Administered 2024-06-10: 15 mL via OROMUCOSAL

## 2024-06-10 MED ORDER — LACTATED RINGERS IV SOLN: INTRAVENOUS | Status: DC

## 2024-06-10 MED ORDER — OXYCODONE HCL 5 MG PO TABS: 5.0000 mg | ORAL_TABLET | Freq: Once | ORAL | Status: DC | PRN

## 2024-06-10 MED ORDER — METRONIDAZOLE 500 MG/100ML IV SOLN
INTRAVENOUS | Status: AC
  Filled 2024-06-10: qty 100

## 2024-06-10 MED ORDER — SUGAMMADEX SODIUM 200 MG/2ML IV SOLN
INTRAVENOUS | Status: DC | PRN
  Administered 2024-06-10: 200 mg via INTRAVENOUS

## 2024-06-10 MED ORDER — FENTANYL CITRATE (PF) 100 MCG/2ML IJ SOLN
INTRAMUSCULAR | Status: DC | PRN
  Administered 2024-06-10: 50 ug via INTRAVENOUS
  Administered 2024-06-10: 25 ug via INTRAVENOUS
  Administered 2024-06-10: 100 ug via INTRAVENOUS
  Administered 2024-06-10: 25 ug via INTRAVENOUS
  Administered 2024-06-10: 50 ug via INTRAVENOUS

## 2024-06-10 MED ORDER — METHYLENE BLUE 20 MG/2ML IV SOSY
PREFILLED_SYRINGE | INTRAVENOUS | Status: DC | PRN
  Administered 2024-06-10: 1 mL via INTRAVENOUS

## 2024-06-10 MED ORDER — HYDROMORPHONE HCL 1 MG/ML IJ SOLN
INTRAMUSCULAR | Status: AC
  Filled 2024-06-10: qty 1

## 2024-06-10 MED ORDER — PHENYLEPHRINE 80 MCG/ML (10ML) SYRINGE FOR IV PUSH (FOR BLOOD PRESSURE SUPPORT)
PREFILLED_SYRINGE | INTRAVENOUS | Status: AC
  Filled 2024-06-10: qty 10

## 2024-06-10 MED ORDER — MIDAZOLAM HCL 2 MG/2ML IJ SOLN
INTRAMUSCULAR | Status: AC
  Filled 2024-06-10: qty 2

## 2024-06-10 MED ORDER — ACETAMINOPHEN 10 MG/ML IV SOLN: 1000.0000 mg | Freq: Once | INTRAVENOUS | Status: DC | PRN

## 2024-06-10 MED ORDER — DEXMEDETOMIDINE HCL IN NACL 80 MCG/20ML IV SOLN
INTRAVENOUS | Status: DC | PRN
  Administered 2024-06-10: 8 ug via INTRAVENOUS
  Administered 2024-06-10: 4 ug via INTRAVENOUS

## 2024-06-10 MED ORDER — GABAPENTIN 300 MG PO CAPS
300.0000 mg | ORAL_CAPSULE | ORAL | Status: AC
  Administered 2024-06-10: 300 mg via ORAL

## 2024-06-10 MED ORDER — ALBUMIN HUMAN 5 % IV SOLN: INTRAVENOUS | Status: DC | PRN

## 2024-06-10 MED ORDER — DEXAMETHASONE SOD PHOSPHATE PF 10 MG/ML IJ SOLN
INTRAMUSCULAR | Status: DC | PRN
  Administered 2024-06-10: 8 mg via INTRAVENOUS

## 2024-06-10 MED ORDER — FENTANYL CITRATE (PF) 250 MCG/5ML IJ SOLN
INTRAMUSCULAR | Status: AC
  Filled 2024-06-10: qty 5

## 2024-06-10 MED ORDER — HYDROMORPHONE HCL 1 MG/ML IJ SOLN
INTRAMUSCULAR | Status: DC | PRN
  Administered 2024-06-10 (×2): .5 mg via INTRAVENOUS

## 2024-06-10 MED ORDER — SCOPOLAMINE 1 MG/3DAYS TD PT72
MEDICATED_PATCH | TRANSDERMAL | Status: AC
  Filled 2024-06-10: qty 1

## 2024-06-10 MED ORDER — KETOROLAC TROMETHAMINE 30 MG/ML IJ SOLN
INTRAMUSCULAR | Status: AC
  Filled 2024-06-10: qty 1

## 2024-06-10 MED ORDER — PROPOFOL 10 MG/ML IV BOLUS
INTRAVENOUS | Status: DC | PRN
  Administered 2024-06-10: 50 ug via INTRAVENOUS
  Administered 2024-06-10: 100 ug via INTRAVENOUS

## 2024-06-10 MED ORDER — SCOPOLAMINE 1 MG/3DAYS TD PT72
1.0000 | MEDICATED_PATCH | TRANSDERMAL | Status: DC
  Administered 2024-06-10: 1 mg via TRANSDERMAL

## 2024-06-10 MED ORDER — SODIUM CHLORIDE 0.9 % IV SOLN
2.0000 g | INTRAVENOUS | Status: AC
  Administered 2024-06-10: 2 g via INTRAVENOUS

## 2024-06-10 MED ORDER — POVIDONE-IODINE 10 % EX SWAB: 2.0000 | Freq: Once | CUTANEOUS | Status: DC

## 2024-06-10 MED ORDER — METHYLENE BLUE 20 MG/2ML IV SOSY
PREFILLED_SYRINGE | INTRAVENOUS | Status: AC
  Filled 2024-06-10: qty 2

## 2024-06-10 MED ORDER — LIDOCAINE 2% (20 MG/ML) 5 ML SYRINGE
INTRAMUSCULAR | Status: AC
  Filled 2024-06-10: qty 5

## 2024-06-10 MED ORDER — ROCURONIUM BROMIDE 100 MG/10ML IV SOLN
INTRAVENOUS | Status: DC | PRN
  Administered 2024-06-10: 60 mg via INTRAVENOUS

## 2024-06-10 MED ORDER — MIDAZOLAM HCL 5 MG/5ML IJ SOLN
INTRAMUSCULAR | Status: DC | PRN
  Administered 2024-06-10: 2 mg via INTRAVENOUS

## 2024-06-10 MED ORDER — OXYCODONE HCL 5 MG/5ML PO SOLN: 5.0000 mg | Freq: Once | ORAL | Status: DC | PRN

## 2024-06-10 MED ORDER — PHENYLEPHRINE 80 MCG/ML (10ML) SYRINGE FOR IV PUSH (FOR BLOOD PRESSURE SUPPORT)
PREFILLED_SYRINGE | INTRAVENOUS | Status: DC | PRN
  Administered 2024-06-10 (×2): 100 ug via INTRAVENOUS
  Administered 2024-06-10: 80 ug via INTRAVENOUS

## 2024-06-10 MED ORDER — ROCURONIUM BROMIDE 10 MG/ML (PF) SYRINGE
PREFILLED_SYRINGE | INTRAVENOUS | Status: AC
  Filled 2024-06-10: qty 10

## 2024-06-10 MED ORDER — METRONIDAZOLE 500 MG/100ML IV SOLN
500.0000 mg | Freq: Once | INTRAVENOUS | Status: AC
  Administered 2024-06-10: 500 mg via INTRAVENOUS

## 2024-06-10 MED ORDER — CHLORHEXIDINE GLUCONATE 0.12 % MT SOLN
OROMUCOSAL | Status: AC
  Filled 2024-06-10: qty 15

## 2024-06-10 MED ORDER — ACETAMINOPHEN 500 MG PO TABS
1000.0000 mg | ORAL_TABLET | ORAL | Status: AC
  Administered 2024-06-10: 1000 mg via ORAL

## 2024-06-10 MED ORDER — ONDANSETRON HCL 4 MG/2ML IJ SOLN
INTRAMUSCULAR | Status: DC | PRN
  Administered 2024-06-10: 4 mg via INTRAVENOUS

## 2024-06-10 MED ORDER — AMISULPRIDE (ANTIEMETIC) 5 MG/2ML IV SOLN: 10.0000 mg | Freq: Once | INTRAVENOUS | Status: DC | PRN

## 2024-06-10 MED ORDER — ONDANSETRON HCL 4 MG/2ML IJ SOLN
INTRAMUSCULAR | Status: AC
  Filled 2024-06-10: qty 2

## 2024-06-10 MED ORDER — SODIUM CHLORIDE 0.9 % IV SOLN
INTRAVENOUS | Status: AC
  Filled 2024-06-10: qty 2

## 2024-06-10 MED ORDER — HYDROMORPHONE HCL 1 MG/ML IJ SOLN: 0.2500 mg | INTRAMUSCULAR | Status: DC | PRN

## 2024-06-10 MED ORDER — ORAL CARE MOUTH RINSE: 15.0000 mL | Freq: Once | OROMUCOSAL | Status: AC

## 2024-06-10 MED ORDER — ACETAMINOPHEN 500 MG PO TABS
ORAL_TABLET | ORAL | Status: AC
  Filled 2024-06-10: qty 2

## 2024-06-10 MED ORDER — LACTATED RINGERS IV BOLUS
500.0000 mL | Freq: Once | INTRAVENOUS | Status: AC
  Administered 2024-06-10: 500 mL via INTRAVENOUS

## 2024-06-10 MED ORDER — PROPOFOL 500 MG/50ML IV EMUL
INTRAVENOUS | Status: DC | PRN
  Administered 2024-06-10: 125 ug/kg/min via INTRAVENOUS

## 2024-06-10 MED ORDER — KETOROLAC TROMETHAMINE 30 MG/ML IJ SOLN
INTRAMUSCULAR | Status: DC | PRN
  Administered 2024-06-10: 30 mg via INTRAVENOUS

## 2024-06-10 NOTE — Interval H&P Note (Signed)
 History and Physical Interval Note:  06/10/2024 12:59 PM  Lindsay Wong  has presented today for surgery, with the diagnosis of dysmenorrhea, bladder adhesions, thickened endometrium, adenomyosis, iron deficency anemia.  The various methods of treatment have been discussed with the patient and family. After consideration of risks, benefits and other options for treatment, the patient has consented to  Procedures: HYSTERECTOMY, TOTAL, LAPAROSCOPIC, ROBOT-ASSISTED WITH SALPINGECTOMY (Bilateral) CYSTOSCOPY (Bilateral) LYSIS, ADHESIONS, ROBOT-ASSISTED, LAPAROSCOPIC (Bilateral) as a surgical intervention.  The patient's history has been reviewed, patient examined, no change in status, stable for surgery.  I have reviewed the patient's chart and labs.  Questions were answered to the patient's satisfaction.     [name]

## 2024-06-10 NOTE — Anesthesia Preprocedure Evaluation (Addendum)
"                                    Anesthesia Evaluation  Patient identified by MRN, date of birth, ID band Patient awake    Reviewed: Allergy & Precautions, NPO status , Patient's Chart, lab work & pertinent test results  History of Anesthesia Complications (+) PONV and history of anesthetic complications  Airway Mallampati: I  TM Distance: >3 FB Neck ROM: Full    Dental  (+) Teeth Intact, Dental Advisory Given   Pulmonary neg pulmonary ROS   breath sounds clear to auscultation       Cardiovascular hypertension, Pt. on medications  Rhythm:Regular Rate:Normal     Neuro/Psych  Headaches  Anxiety Depression       GI/Hepatic ,GERD  Medicated and Controlled,,  Endo/Other  neg diabetes    Renal/GU Renal disease     Musculoskeletal  (+) Arthritis , Rheumatoid disorders,    Abdominal   Peds  Hematology  (+) Blood dyscrasia, anemia  Hgb 12.6, Plts 360K (06/10/24)     Anesthesia Other Findings   Reproductive/Obstetrics                              Anesthesia Physical Anesthesia Plan  ASA: 2  Anesthesia Plan: General   Post-op Pain Management:    Induction: Intravenous  PONV Risk Score and Plan: 2 and Ondansetron , Dexamethasone , Midazolam , Treatment may vary due to age or medical condition, Scopolamine  patch - Pre-op and Propofol  infusion  Airway Management Planned: Oral ETT  Additional Equipment: None  Intra-op Plan:   Post-operative Plan: Extubation in OR  Informed Consent: I have reviewed the patients History and Physical, chart, labs and discussed the procedure including the risks, benefits and alternatives for the proposed anesthesia with the patient or authorized representative who has indicated his/her understanding and acceptance.     Dental advisory given  Plan Discussed with: CRNA  Anesthesia Plan Comments:          Anesthesia Quick Evaluation  "

## 2024-06-10 NOTE — Op Note (Addendum)
 06/10/2024  [id number] Lindsay Wong Staff        OPERATIVE REPORT   Preop Diagnosis: menorrhagia, dysmenorrhea, endometrial polyp, history of cesarean x3 known adhesions from lower uterine segment to bladder   Procedure: robotic hysterectomy, bilateral salpingectomy, cystoscopy   Surgeon: Dr. Almarie Sauer Karlei Waldo Assistant: Judyann Rattler, RN  Circulator: Waddell Silvano HERO, RN Scrub Person: Dyane Greig BRAVO RN First Assistant: Rattler Judyann CROME, RN  Fluids: please see anesthesia report   Complications: None Anesthesia: General     Findings:  boggy contour 8cm uterus, normal tubes, abnormal left ovary with two large cysts, normal right ovary, dense adhesions from anterior abdominal wall to lower uterine segment from prior cesarean deliveries. Cystoscopy at the end of the case with normal bladder and patent ureters bilaterally.   Estimated blood loss: 50cc   Specimens: Uterus, cervix and bilateral tubes and left ovary with ovarian cysts   Disposition of specimen: Pathology           Patient is taken to the operating room. She is placed in the supine position. She is a running IV in place. Informed consent was present on the chart. SCDs on her lower extremities and functioning properly. Patient was positioned while she was awake.  Her legs were placed in the low lithotomy position in [place] stirrups. Her arms were tucked by the side.  General endotracheal anesthesia was administered by the anesthesia staff without difficulty.       Dura prep was then used to prep the abdomen and Hibiclens  was used to prep the inner thighs, perineum and vagina. Once 3 minutes had past the patient was draped in a normal standard fashion. A proper time out was performed and everyone agreed.  The legs were lifted to the high lithotomy position. A bivalve speculum was inserted into the vagina and the anterior lip of the cervix was grasped with single-tooth tenaculum.  The uterus sounded to 8 cm. Pratt  dilators were used to dilate the cervix.  The RUMI uterine manipulator was obtained inserted into the endometrial cavity and the bulb of the disposable tip was inflated with 8 cc of normal saline. There was a good fit of the KOH ring around the cervix. The tenaculum and bivavle speculum was removed. There is also good manipulation of the uterus.  A Foley catheter was placed to straight drain.  Clear urine was noted. Legs were lowered to the low lithotomy position and attention was turned the abdomen.   Superior to the umbilicus, marcaine  0.25% used to anesthetize the skin.  Using #11 blade, 8mm skin incision was made.  The 8mm robotic trocar and sleeve was inserted under direct visualization.  CO2 gas was  started and patient was placed in trendelenburg position.  Two additional 8mm ports were placed under direct visualization in the left and right lower quadrant.     Ureters were identifies.  Attention was turned to the adhesions. The bladder was clamped and back filled with methylene blue .  The adhesion was taken down were clear visualization of the bladder.  Attention was then turned to the left side. The left tube and abnormal ovary with the cysts was elevated and the IP ligament  was desiccated with the vessel sealer. Left round ligament was serially clamped cauterized and incised. The anterior and posterior peritoneum of the inferior leaf of the broad ligament were opened. The beginning of the bladder flap was created.  The bladder was taken down below the level of the KOH ring. The  left uterine artery skeletonized and then just superior to the KOH ring this vessel was serially clamped, cauterized, and incised.   Attention was turned the right side.  The uterus was placed on stretch to the opposite side.    The mesosalpinx was incised freeing the tube. Then the right uterine ovarian pedicle was serially clamped cauterized and incised. Next the right round ligament was serially clamped cauterized and  incised. The anterior posterior peritoneum of the inferiorly for the broad ligament were opened. The anterior peritoneum was carried across to the dissection on the left side. The remainder of the bladder flap was created using sharp dissection. The bladder was well below the level of the KOH ring. The right uterine artery skeletonized. Then the right uterine artery, above the level of the KOH ring, was serially clamped cauterized and incised. The uterus was devascularized at this point.   The colpotomy was performed.  This was carried around a circumferential fashion until the vaginal mucosa was completely incised in the specimen was freed.  The specimen was then delivered to the vagina intact.  A vaginal occlusive device was used to maintain the pneumoperitoneum   Instruments were changed with a needle driver and prograsp.  Using a 9 inch  zero V-lock suture, the cuff was closed by incorporating the anterior and posterior vaginal mucosa in each stitch. This was carried across all the way to the left corner and a running fashion. Two stitches were brought back towards the midline and the suture was cut flush with the vagina. The needle was brought out the pelvis. The pelvis was irrigated. All pedicles were inspected. No bleeding was noted.   Co2 pressures were lowered to 8mm Hg.  Again, no bleeding was noted.  Ureters were noted deep in the pelvis to be peristalsing.  At this point the procedure was completed.  The remaining instruments were removed.  The ports were removed under direct visualization of the laparoscope and the pneumoperitoneum was relieved.   The skin was then closed with subcuticular stitches of 3-0 Vicryl. The skin was cleansed Dermabond was applied. Attention was then turned the vagina and the cuff was inspected. No bleeding was noted.  The Foley catheter was removed.  Cystoscopy was performed.  No sutures or bladder injuries were noted.  Ureters were noted with normal urine jets from each  one was seen.  Foley was left out after the cystoscopic fluid was drained and cystoscope removed.  Sponge, lap, needle, instrument counts were correct x2. Patient tolerated the procedure very well. She was awakened from anesthesia, extubated and taken to recovery in stable condition.      Dr. Glennon

## 2024-06-10 NOTE — Interval H&P Note (Signed)
 History and Physical Interval Note:  06/10/2024 12:58 PM  Lindsay Wong  has presented today for surgery, with the diagnosis of dysmenorrhea, bladder adhesions, thickened endometrium, adenomyosis, iron deficency anemia.  The various methods of treatment have been discussed with the patient and family. After consideration of risks, benefits and other options for treatment, the patient has consented to  Procedures: HYSTERECTOMY, TOTAL, LAPAROSCOPIC, ROBOT-ASSISTED WITH SALPINGECTOMY (Bilateral) CYSTOSCOPY (Bilateral) LYSIS, ADHESIONS, ROBOT-ASSISTED, LAPAROSCOPIC (Bilateral) as a surgical intervention.  The patient's history has been reviewed, patient examined, no change in status, stable for surgery.  I have reviewed the patient's chart and labs.  Questions were answered to the patient's satisfaction.     [name]

## 2024-06-10 NOTE — Anesthesia Postprocedure Evaluation (Signed)
"   Anesthesia Post Note  Patient: PAMELYN BANCROFT  Procedure(s) Performed: HYSTERECTOMY, TOTAL, LAPAROSCOPIC, ROBOT-ASSISTED WITH SALPINGECTOMY, LEFT OOPHORECTOMY (Bilateral) CYSTOSCOPY (Bilateral) LYSIS, ADHESIONS, ROBOT-ASSISTED, LAPAROSCOPIC (Bilateral)     Patient location during evaluation: PACU Anesthesia Type: General Level of consciousness: sedated and patient cooperative Pain management: pain level controlled Vital Signs Assessment: post-procedure vital signs reviewed and stable Respiratory status: spontaneous breathing Cardiovascular status: stable Anesthetic complications: no   No notable events documented.  Last Vitals:  Vitals:   06/10/24 1930 06/10/24 1945  BP: 107/73 114/71  Pulse: (!) 109 (!) 102  Resp: 14 16  Temp:  (!) 36.3 C  SpO2: 93% 94%    Last Pain:  Vitals:   06/10/24 1945  TempSrc:   PainSc: 0-No pain                 Norleen Pope      "

## 2024-06-10 NOTE — Anesthesia Procedure Notes (Signed)
 Procedure Name: Intubation Date/Time: 06/10/2024 4:22 PM  Performed by: Denton Niels CROME, CRNAPre-anesthesia Checklist: Patient identified, Emergency Drugs available, Suction available and Patient being monitored Patient Re-evaluated:Patient Re-evaluated prior to induction Oxygen Delivery Method: Circle system utilized Preoxygenation: Pre-oxygenation with 100% oxygen Induction Type: IV induction Ventilation: Mask ventilation without difficulty Laryngoscope Size: Mac and 3 Grade View: Grade II Tube type: Oral Tube size: 7.0 mm Number of attempts: 1 Airway Equipment and Method: Stylet Placement Confirmation: ETT inserted through vocal cords under direct vision, positive ETCO2 and breath sounds checked- equal and bilateral Secured at: 21 cm Tube secured with: Tape Dental Injury: Teeth and Oropharynx as per pre-operative assessment

## 2024-06-10 NOTE — Transfer of Care (Signed)
 Immediate Anesthesia Transfer of Care Note  Patient: Lindsay Wong  Procedure(s) Performed: HYSTERECTOMY, TOTAL, LAPAROSCOPIC, ROBOT-ASSISTED WITH SALPINGECTOMY, LEFT OOPHORECTOMY (Bilateral) CYSTOSCOPY (Bilateral) LYSIS, ADHESIONS, ROBOT-ASSISTED, LAPAROSCOPIC (Bilateral)  Patient Location: PACU  Anesthesia Type:General  Level of Consciousness: drowsy, patient cooperative, and responds to stimulation  Airway & Oxygen Therapy: Patient Spontanous Breathing and Patient connected to face mask oxygen  Post-op Assessment: Report given to RN and Post -op Vital signs reviewed and stable  Post vital signs: Reviewed and stable  Last Vitals:  Vitals Value Taken Time  BP 88/53 06/10/24 18:25  Temp 36.3 C 06/10/24 18:25  Pulse 84 06/10/24 18:29  Resp 6 06/10/24 18:29  SpO2 96 % 06/10/24 18:29  Vitals shown include unfiled device data.  Last Pain:  Vitals:   06/10/24 1825  TempSrc:   PainSc: Asleep      Patients Stated Pain Goal: 5 (06/10/24 1246)  Complications: No notable events documented.

## 2024-06-11 ENCOUNTER — Encounter (HOSPITAL_COMMUNITY): Payer: Self-pay | Admitting: Obstetrics and Gynecology

## 2024-06-15 ENCOUNTER — Encounter: Payer: Self-pay | Admitting: Radiology

## 2024-06-18 LAB — SURGICAL PATHOLOGY

## 2024-06-22 ENCOUNTER — Encounter: Admitting: Obstetrics and Gynecology

## 2024-06-24 ENCOUNTER — Encounter: Admitting: Obstetrics and Gynecology

## 2024-07-20 ENCOUNTER — Encounter: Admitting: Obstetrics and Gynecology

## 2024-08-10 ENCOUNTER — Ambulatory Visit: Admitting: Physician Assistant

## 2024-08-17 ENCOUNTER — Encounter: Admitting: Obstetrics and Gynecology

## 2024-08-25 ENCOUNTER — Ambulatory Visit (HOSPITAL_COMMUNITY): Admitting: Student in an Organized Health Care Education/Training Program

## 2025-02-28 ENCOUNTER — Ambulatory Visit: Admitting: Obstetrics and Gynecology

## 2025-03-14 ENCOUNTER — Ambulatory Visit: Admitting: Obstetrics and Gynecology
